# Patient Record
Sex: Male | Born: 1960 | Race: White | Hispanic: No | Marital: Married | Smoking: Never smoker
Health system: Southern US, Community
[De-identification: ages and names within clinical notes are randomized; demographics above are authoritative.]

## PROBLEM LIST (undated history)

## (undated) DIAGNOSIS — K420 Umbilical hernia with obstruction, without gangrene: Secondary | ICD-10-CM

## (undated) DIAGNOSIS — I82509 Chronic embolism and thrombosis of unspecified deep veins of unspecified lower extremity: Secondary | ICD-10-CM

## (undated) DIAGNOSIS — I10 Essential (primary) hypertension: Secondary | ICD-10-CM

## (undated) DIAGNOSIS — E7800 Pure hypercholesterolemia, unspecified: Secondary | ICD-10-CM

## (undated) DIAGNOSIS — Z9289 Personal history of other medical treatment: Secondary | ICD-10-CM

## (undated) DIAGNOSIS — K4090 Unilateral inguinal hernia, without obstruction or gangrene, not specified as recurrent: Secondary | ICD-10-CM

## (undated) DIAGNOSIS — Z955 Presence of coronary angioplasty implant and graft: Secondary | ICD-10-CM

## (undated) DIAGNOSIS — K219 Gastro-esophageal reflux disease without esophagitis: Secondary | ICD-10-CM

## (undated) DIAGNOSIS — R001 Bradycardia, unspecified: Secondary | ICD-10-CM

## (undated) DIAGNOSIS — I2510 Atherosclerotic heart disease of native coronary artery without angina pectoris: Secondary | ICD-10-CM

## (undated) DIAGNOSIS — G5602 Carpal tunnel syndrome, left upper limb: Secondary | ICD-10-CM

## (undated) DIAGNOSIS — N400 Enlarged prostate without lower urinary tract symptoms: Secondary | ICD-10-CM

## (undated) DIAGNOSIS — E785 Hyperlipidemia, unspecified: Secondary | ICD-10-CM

## (undated) DIAGNOSIS — D689 Coagulation defect, unspecified: Secondary | ICD-10-CM

## (undated) DIAGNOSIS — I700 Atherosclerosis of aorta: Secondary | ICD-10-CM

## (undated) DIAGNOSIS — T7840XA Allergy, unspecified, initial encounter: Secondary | ICD-10-CM

## (undated) DIAGNOSIS — K760 Fatty (change of) liver, not elsewhere classified: Secondary | ICD-10-CM

## (undated) DIAGNOSIS — L219 Seborrheic dermatitis, unspecified: Secondary | ICD-10-CM

## (undated) DIAGNOSIS — R7309 Other abnormal glucose: Secondary | ICD-10-CM

## (undated) DIAGNOSIS — K429 Umbilical hernia without obstruction or gangrene: Secondary | ICD-10-CM

## (undated) DIAGNOSIS — N200 Calculus of kidney: Secondary | ICD-10-CM

## (undated) DIAGNOSIS — Z7901 Long term (current) use of anticoagulants: Secondary | ICD-10-CM

## (undated) DIAGNOSIS — I77810 Thoracic aortic ectasia: Secondary | ICD-10-CM

## (undated) DIAGNOSIS — N529 Male erectile dysfunction, unspecified: Secondary | ICD-10-CM

## (undated) DIAGNOSIS — I712 Thoracic aortic aneurysm, without rupture, unspecified: Secondary | ICD-10-CM

## (undated) HISTORY — DX: Calculus of kidney: N20.0

## (undated) HISTORY — PX: COLONOSCOPY: SHX174

## (undated) HISTORY — DX: Allergy, unspecified, initial encounter: T78.40XA

## (undated) HISTORY — PX: REPLACEMENT TOTAL KNEE BILATERAL: SUR1225

## (undated) HISTORY — DX: Essential (primary) hypertension: I10

## (undated) HISTORY — DX: Hyperlipidemia, unspecified: E78.5

## (undated) HISTORY — DX: Gastro-esophageal reflux disease without esophagitis: K21.9

## (undated) HISTORY — PX: WRIST SURGERY: SHX841

## (undated) HISTORY — DX: Coagulation defect, unspecified: D68.9

---

## 1997-09-08 HISTORY — PX: ANTERIOR CRUCIATE LIGAMENT REPAIR: SHX115

## 2010-09-08 DIAGNOSIS — I219 Acute myocardial infarction, unspecified: Secondary | ICD-10-CM

## 2010-09-08 DIAGNOSIS — I2510 Atherosclerotic heart disease of native coronary artery without angina pectoris: Secondary | ICD-10-CM

## 2010-09-08 DIAGNOSIS — I252 Old myocardial infarction: Secondary | ICD-10-CM

## 2010-09-08 HISTORY — DX: Atherosclerotic heart disease of native coronary artery without angina pectoris: I25.10

## 2010-09-08 HISTORY — DX: Acute myocardial infarction, unspecified: I21.9

## 2010-09-08 HISTORY — DX: Old myocardial infarction: I25.2

## 2010-09-08 HISTORY — PX: CORONARY ANGIOPLASTY WITH STENT PLACEMENT: SHX49

## 2015-09-09 HISTORY — PX: TOTAL KNEE ARTHROPLASTY: SHX125

## 2016-09-08 HISTORY — PX: CARPAL TUNNEL RELEASE: SHX101

## 2017-09-08 HISTORY — PX: TOTAL KNEE ARTHROPLASTY: SHX125

## 2017-12-27 DIAGNOSIS — Z955 Presence of coronary angioplasty implant and graft: Secondary | ICD-10-CM | POA: Insufficient documentation

## 2017-12-27 DIAGNOSIS — I712 Thoracic aortic aneurysm, without rupture, unspecified: Secondary | ICD-10-CM | POA: Insufficient documentation

## 2019-09-09 DIAGNOSIS — Z86718 Personal history of other venous thrombosis and embolism: Secondary | ICD-10-CM

## 2019-09-09 DIAGNOSIS — U071 COVID-19: Secondary | ICD-10-CM

## 2019-09-09 HISTORY — DX: Personal history of other venous thrombosis and embolism: Z86.718

## 2019-09-09 HISTORY — DX: COVID-19: U07.1

## 2020-04-27 DIAGNOSIS — Z8616 Personal history of COVID-19: Secondary | ICD-10-CM

## 2020-04-27 HISTORY — DX: Personal history of COVID-19: Z86.16

## 2021-05-01 ENCOUNTER — Ambulatory Visit: Payer: Self-pay | Admitting: Internal Medicine

## 2021-06-11 ENCOUNTER — Ambulatory Visit: Payer: BC Managed Care – PPO | Admitting: Internal Medicine

## 2021-06-11 ENCOUNTER — Other Ambulatory Visit: Payer: Self-pay

## 2021-06-11 ENCOUNTER — Encounter: Payer: Self-pay | Admitting: Internal Medicine

## 2021-06-11 VITALS — BP 156/99 | HR 64 | Ht 67.0 in | Wt 189.0 lb

## 2021-06-11 DIAGNOSIS — I25112 Atherosclerosic heart disease of native coronary artery with refractory angina pectoris: Secondary | ICD-10-CM | POA: Diagnosis not present

## 2021-06-11 DIAGNOSIS — Z86718 Personal history of other venous thrombosis and embolism: Secondary | ICD-10-CM | POA: Insufficient documentation

## 2021-06-11 DIAGNOSIS — E785 Hyperlipidemia, unspecified: Secondary | ICD-10-CM

## 2021-06-11 DIAGNOSIS — I10 Essential (primary) hypertension: Secondary | ICD-10-CM | POA: Insufficient documentation

## 2021-06-11 LAB — MICROSCOPIC EXAMINATION: Epithelial Cells (non renal): NONE SEEN /hpf (ref 0–10)

## 2021-06-11 LAB — URINALYSIS, ROUTINE W REFLEX MICROSCOPIC
Bilirubin, UA: NEGATIVE
Glucose, UA: NEGATIVE
Ketones, UA: NEGATIVE
Nitrite, UA: NEGATIVE
Protein,UA: NEGATIVE
Specific Gravity, UA: 1.025 (ref 1.005–1.030)
Urobilinogen, Ur: 0.2 mg/dL (ref 0.2–1.0)
pH, UA: 5 (ref 5.0–7.5)

## 2021-06-11 MED ORDER — METOPROLOL TARTRATE 25 MG PO TABS: 12.5000 mg | ORAL_TABLET | Freq: Every day | ORAL | 2 refills | Status: DC

## 2021-06-11 MED ORDER — HYDROXYZINE PAMOATE 25 MG PO CAPS: 25.0000 mg | ORAL_CAPSULE | Freq: Every day | ORAL | 1 refills | Status: DC

## 2021-06-11 MED ORDER — LOSARTAN POTASSIUM 100 MG PO TABS: 100.0000 mg | ORAL_TABLET | Freq: Every day | ORAL | 2 refills | Status: DC

## 2021-06-11 MED ORDER — ASPIRIN 81 MG PO TBEC: 81.0000 mg | DELAYED_RELEASE_TABLET | Freq: Every day | ORAL | 12 refills | Status: DC

## 2021-06-11 MED ORDER — CLONIDINE HCL 0.1 MG PO TABS: 0.1000 mg | ORAL_TABLET | Freq: Once | ORAL | Status: DC

## 2021-06-11 MED ORDER — AMLODIPINE BESYLATE 5 MG PO TABS: 5.0000 mg | ORAL_TABLET | Freq: Every day | ORAL | 2 refills | Status: DC

## 2021-06-11 NOTE — Progress Notes (Signed)
BP (!) 156/99 (BP Location: Right Arm, Cuff Size: Large)   Pulse 64   Ht 5\' 7"  (1.702 m)   Wt 189 lb (85.7 kg)   SpO2 100%   BMI 29.60 kg/m    Subjective:    Patient ID: Jaime [name], male    DOB: 11-Jun-1961, 60 y.o.   MRN: [id number]  Chief Complaint  Patient presents with   New Patient (Initial Visit)    HPI: Jaime [name] is a 60 y.o. male  Pt is here to establish care.   He [place] from [place] , wife passed away last year. Married again in June 2022. He has a friend who moved from [facility]   CAD - 2012 s/p stenting is on ranexa last seen   Per pt had COVID jan 2021 and was pplaced on eliquis for ? Blood clots in lower extremity.  Pt is a very poor historian.   Head itching per pt.    Hypertension This is a chronic problem. The current episode started more than 1 year ago. The problem is unchanged. The problem is uncontrolled. Pertinent negatives include no anxiety, blurred vision, chest pain, headaches, malaise/fatigue, neck pain, orthopnea, palpitations, peripheral edema, PND or shortness of breath.  Hyperlipidemia This is a chronic problem. Pertinent negatives include no chest pain or shortness of breath. Risk factors for coronary artery disease include dyslipidemia.   Chief Complaint  Patient presents with   New Patient (Initial Visit)    Relevant past medical, surgical, family and social history reviewed and updated as indicated. Interim medical history since our last visit reviewed. Allergies and medications reviewed and updated.  Review of Systems  Constitutional:  Negative for malaise/fatigue.  Eyes:  Negative for blurred vision.  Respiratory:  Negative for shortness of breath.   Cardiovascular:  Negative for chest pain, palpitations, orthopnea and PND.  Musculoskeletal:  Negative for neck pain.  Neurological:  Negative for headaches.   Per HPI unless specifically indicated above     Objective:    BP (!) 156/99 (BP Location: Right Arm, Cuff  Size: Large)   Pulse 64   Ht 5\' 7"  (1.702 m)   Wt 189 lb (85.7 kg)   SpO2 100%   BMI 29.60 kg/m   Wt Readings from Last 3 Encounters:  06/11/21 189 lb (85.7 kg)    Physical Exam Vitals and nursing note reviewed.  Constitutional:      General: He is not in acute distress.    Appearance: Normal appearance. He is not ill-appearing or diaphoretic.  HENT:     Head: Atraumatic.  Eyes:     Conjunctiva/sclera: Conjunctivae normal.     Pupils: Pupils are equal, round, and reactive to light.  Cardiovascular:     Rate and Rhythm: Normal rate and regular rhythm.     Heart sounds: No murmur heard.   No friction rub. No gallop.  Pulmonary:     Effort: No respiratory distress.     Breath sounds: No stridor. No wheezing or rhonchi.  Chest:     Chest wall: No tenderness.  Abdominal:     General: Abdomen is flat. Bowel sounds are normal. There is no distension.     Palpations: Abdomen is soft. There is no mass.     Tenderness: There is no abdominal tenderness. There is no guarding.  Musculoskeletal:        General: No swelling or deformity.     Cervical back: Normal range of motion and neck supple. No rigidity or  tenderness.     Right lower leg: No edema.     Left lower leg: No edema.  Skin:    General: Skin is warm and dry.     Coloration: Skin is not jaundiced.     Findings: No erythema.  Neurological:     Mental Status: He is alert and oriented to person, place, and time. Mental status is at baseline.  Psychiatric:        Mood and Affect: Mood normal.        Behavior: Behavior normal.        Thought Content: Thought content normal.        Judgment: Judgment normal.    No results found for this or any previous visit.      Current Outpatient Medications:    aspirin 81 MG EC tablet, Take 1 tablet (81 mg total) by mouth daily. Swallow whole., Disp: 30 tablet, Rfl: 12   ezetimibe (ZETIA) 10 MG tablet, Take 10 mg by mouth daily., Disp: , Rfl:    hydrOXYzine (VISTARIL) 25 MG  capsule, Take 1 capsule (25 mg total) by mouth at bedtime., Disp: 30 capsule, Rfl: 1   icosapent Ethyl (VASCEPA) 1 g capsule, Take 2 g by mouth 2 (two) times daily., Disp: , Rfl:    montelukast (SINGULAIR) 10 MG tablet, Take 10 mg by mouth daily., Disp: , Rfl:    ranolazine (RANEXA) 1000 MG SR tablet, Take 1,000 mg by mouth 2 (two) times daily., Disp: , Rfl:    rosuvastatin (CRESTOR) 40 MG tablet, Take 40 mg by mouth daily., Disp: , Rfl:    tadalafil (CIALIS) 20 MG tablet, Take 20 mg by mouth daily as needed for erectile dysfunction., Disp: , Rfl:    amLODipine (NORVASC) 5 MG tablet, Take 1 tablet (5 mg total) by mouth daily., Disp: 90 tablet, Rfl: 2   losartan (COZAAR) 100 MG tablet, Take 1 tablet (100 mg total) by mouth daily., Disp: 90 tablet, Rfl: 2   metoprolol tartrate (LOPRESSOR) 25 MG tablet, Take 0.5 tablets (12.5 mg total) by mouth at bedtime., Disp: 45 tablet, Rfl: 2  Current Facility-Administered Medications:    cloNIDine (CATAPRES) tablet 0.1 mg, 0.1 mg, Oral, Once, Marwah Disbro, MD    Assessment & Plan:  Htn is on metoprolol 12.5 mg, norvasc 5 mg and losartan 100 mg  Continue current meds.  Medication compliance emphasised. pt advised to keep Bp logs. Pt verbalised understanding of the same. Pt to have a low salt diet . Exercise to reach a goal of at least 150 mins a week.  lifestyle modifications explained and pt understands importance of the above.  HLD : is on crestor  40 mg and zetia  LDL not at goal  recheck FLP, check LFT's work on diet, SE of meds explained to pt. low fat and high fiber diet explained to pt.   PVD / CAD is on ranexa.  Will refer pt on cardiology for such  S/p stenting . Continue current meds.    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 - Primary   Relevant Medications   ezetimibe (ZETIA) 10 MG tablet   ranolazine (RANEXA) 1000 MG  SR tablet   rosuvastatin (CRESTOR) 40 MG tablet   icosapent Ethyl (VASCEPA) 1 g capsule   tadalafil (CIALIS) 20 MG tablet   amLODipine (NORVASC) 5 MG tablet   losartan (COZAAR) 100  MG tablet   metoprolol tartrate (LOPRESSOR) 25 MG tablet   aspirin 81 MG EC tablet   cloNIDine (CATAPRES) tablet 0.1 mg   Other Relevant Orders   Ambulatory referral to Cardiology   Comprehensive metabolic panel   CBC with Differential/Platelet   Urinalysis, Routine w reflex microscopic   Primary hypertension   Relevant Medications   ezetimibe (ZETIA) 10 MG tablet   ranolazine (RANEXA) 1000 MG SR tablet   rosuvastatin (CRESTOR) 40 MG tablet   icosapent Ethyl (VASCEPA) 1 g capsule   tadalafil (CIALIS) 20 MG tablet   amLODipine (NORVASC) 5 MG tablet   losartan (COZAAR) 100 MG tablet   metoprolol tartrate (LOPRESSOR) 25 MG tablet   aspirin 81 MG EC tablet   cloNIDine (CATAPRES) tablet 0.1 mg   Other Relevant Orders   Comprehensive metabolic panel   CBC with Differential/Platelet   Urinalysis, Routine w reflex microscopic     Other   History of DVT (deep vein thrombosis)   Relevant Orders   Ambulatory referral to Hematology / Oncology   Lipid panel   TSH   Hyperlipidemia   Relevant Medications   ezetimibe (ZETIA) 10 MG tablet   ranolazine (RANEXA) 1000 MG SR tablet   rosuvastatin (CRESTOR) 40 MG tablet   icosapent Ethyl (VASCEPA) 1 g capsule   tadalafil (CIALIS) 20 MG tablet   amLODipine (NORVASC) 5 MG tablet   losartan (COZAAR) 100 MG tablet   metoprolol tartrate (LOPRESSOR) 25 MG tablet   aspirin 81 MG EC tablet   cloNIDine (CATAPRES) tablet 0.1 mg   Other Relevant Orders   Lipid panel   TSH     Orders Placed This Encounter  Procedures   Comprehensive metabolic panel   CBC with Differential/Platelet   Urinalysis, Routine w reflex microscopic   Lipid panel   TSH   Ambulatory referral to Cardiology   Ambulatory referral to Hematology / Oncology     Meds ordered this encounter   Medications   amLODipine (NORVASC) 5 MG tablet    Sig: Take 1 tablet (5 mg total) by mouth daily.    Dispense:  90 tablet    Refill:  2   losartan (COZAAR) 100 MG tablet    Sig: Take 1 tablet (100 mg total) by mouth daily.    Dispense:  90 tablet    Refill:  2   metoprolol tartrate (LOPRESSOR) 25 MG tablet    Sig: Take 0.5 tablets (12.5 mg total) by mouth at bedtime.    Dispense:  45 tablet    Refill:  2   aspirin 81 MG EC tablet    Sig: Take 1 tablet (81 mg total) by mouth daily. Swallow whole.    Dispense:  30 tablet    Refill:  12   hydrOXYzine (VISTARIL) 25 MG capsule    Sig: Take 1 capsule (25 mg total) by mouth at bedtime.    Dispense:  30 capsule    Refill:  1   cloNIDine (CATAPRES) tablet 0.1 mg     Follow up plan: No follow-ups on file.  Marland Kitchen  0.1 mg of clonidine given to pt - his bp droped to 171/90 mm hg -- then 156/99 fter 40 and 50 mins of administering this.

## 2021-06-12 LAB — CBC WITH DIFFERENTIAL/PLATELET
Basophils Absolute: 0.1 10*3/uL (ref 0.0–0.2)
Basos: 1 %
EOS (ABSOLUTE): 0.2 10*3/uL (ref 0.0–0.4)
Eos: 2 %
Hematocrit: 49.8 % (ref 37.5–51.0)
Hemoglobin: 16.8 g/dL (ref 13.0–17.7)
Immature Grans (Abs): 0 10*3/uL (ref 0.0–0.1)
Immature Granulocytes: 0 %
Lymphocytes Absolute: 1.8 10*3/uL (ref 0.7–3.1)
Lymphs: 29 %
MCH: 30.6 pg (ref 26.6–33.0)
MCHC: 33.7 g/dL (ref 31.5–35.7)
MCV: 91 fL (ref 79–97)
Monocytes Absolute: 0.7 10*3/uL (ref 0.1–0.9)
Monocytes: 11 %
Neutrophils Absolute: 3.6 10*3/uL (ref 1.4–7.0)
Neutrophils: 57 %
Platelets: 240 10*3/uL (ref 150–450)
RBC: 5.49 x10E6/uL (ref 4.14–5.80)
RDW: 12.8 % (ref 11.6–15.4)
WBC: 6.4 10*3/uL (ref 3.4–10.8)

## 2021-06-12 LAB — COMPREHENSIVE METABOLIC PANEL
ALT: 25 IU/L (ref 0–44)
AST: 21 IU/L (ref 0–40)
Albumin/Globulin Ratio: 2.2 (ref 1.2–2.2)
Albumin: 5 g/dL — ABNORMAL HIGH (ref 3.8–4.9)
Alkaline Phosphatase: 103 IU/L (ref 44–121)
BUN/Creatinine Ratio: 11 (ref 9–20)
BUN: 11 mg/dL (ref 6–24)
Bilirubin Total: 0.4 mg/dL (ref 0.0–1.2)
CO2: 24 mmol/L (ref 20–29)
Calcium: 9.5 mg/dL (ref 8.7–10.2)
Chloride: 103 mmol/L (ref 96–106)
Creatinine, Ser: 1.04 mg/dL (ref 0.76–1.27)
Globulin, Total: 2.3 g/dL (ref 1.5–4.5)
Glucose: 87 mg/dL (ref 70–99)
Potassium: 4.1 mmol/L (ref 3.5–5.2)
Sodium: 145 mmol/L — ABNORMAL HIGH (ref 134–144)
Total Protein: 7.3 g/dL (ref 6.0–8.5)
eGFR: 83 mL/min/{1.73_m2} (ref >59)

## 2021-07-01 ENCOUNTER — Inpatient Hospital Stay: Payer: BC Managed Care – PPO

## 2021-07-01 ENCOUNTER — Encounter: Payer: Self-pay | Admitting: Oncology

## 2021-07-01 ENCOUNTER — Other Ambulatory Visit: Payer: Self-pay

## 2021-07-01 ENCOUNTER — Inpatient Hospital Stay: Payer: BC Managed Care – PPO | Attending: Oncology | Admitting: Oncology

## 2021-07-01 VITALS — BP 144/93 | HR 62 | Temp 97.3°F | Wt 191.6 lb

## 2021-07-01 DIAGNOSIS — Z86718 Personal history of other venous thrombosis and embolism: Secondary | ICD-10-CM | POA: Insufficient documentation

## 2021-07-01 DIAGNOSIS — I2510 Atherosclerotic heart disease of native coronary artery without angina pectoris: Secondary | ICD-10-CM | POA: Insufficient documentation

## 2021-07-01 DIAGNOSIS — Z86711 Personal history of pulmonary embolism: Secondary | ICD-10-CM | POA: Insufficient documentation

## 2021-07-01 DIAGNOSIS — Z800 Family history of malignant neoplasm of digestive organs: Secondary | ICD-10-CM | POA: Diagnosis not present

## 2021-07-01 DIAGNOSIS — Z7901 Long term (current) use of anticoagulants: Secondary | ICD-10-CM | POA: Insufficient documentation

## 2021-07-01 DIAGNOSIS — I252 Old myocardial infarction: Secondary | ICD-10-CM | POA: Diagnosis not present

## 2021-07-01 LAB — ANTITHROMBIN III: AntiThromb III Func: 95 % (ref 75–120)

## 2021-07-01 NOTE — Progress Notes (Addendum)
Hematology/Oncology Consult note [phone] [phone]   Patient Care Team: Loura Pardon, MD as PCP - General (Internal Medicine)  REFERRING PROVIDER: Loura Pardon, MD  CHIEF COMPLAINTS/REASON FOR VISIT:  Evaluation of history of DVT  HISTORY OF PRESENTING ILLNESS:   Jaime [name] is a  60 y.o.  male with PMH listed below was seen in consultation at the request of  Vigg, Avanti, MD  for evaluation of history of DVT  Patient was accompanied by his wife.  He moved from [place].  He reports that he supposed to be on Eliquis for anticoagulation has been off for couple of months. Patient recalls that he has had 2 episodes of VTE. First episode happened in 2000, after ACL repair surgery patient developed left leg DVT and PE.  Patient was put on Coumadin for about a year and he came off.  Second episode was in January 2021.  Patient was admitted in the hospital from 09/19/2019 - 09/26/2019.  He he reports that he developed bilateral lower extremity PE and was put on Eliquis.  His primary care doctor has been prescribing him Eliquis and recommend him to go on lifelong anticoagulation.   Mother had a history of pancreatic cancer. Today patient denies any shortness of breath, no extremity swelling.  Previously he denies any acute bleeding events when he was on anticoagulation.  Denies any constitutional symptoms.  Patient had CAD in 2012 status post stenting. Review of Systems  Constitutional:  Negative for appetite change, chills, fatigue, fever and unexpected weight change.  HENT:   Negative for hearing loss and voice change.   Eyes:  Negative for eye problems and icterus.  Respiratory:  Negative for chest tightness, cough and shortness of breath.   Cardiovascular:  Negative for chest pain and leg swelling.  Gastrointestinal:  Negative for abdominal distention and abdominal pain.  Endocrine: Negative for hot flashes.  Genitourinary:  Negative for difficulty urinating,  dysuria and frequency.   Musculoskeletal:  Negative for arthralgias.  Skin:  Negative for itching and rash.  Neurological:  Negative for light-headedness and numbness.  Hematological:  Negative for adenopathy. Does not bruise/bleed easily.  Psychiatric/Behavioral:  Negative for confusion.    MEDICAL HISTORY:  Past Medical History:  Diagnosis Date   Allergy    Clotting disorder (HCC)    GERD (gastroesophageal reflux disease)    History of heart attack 2012   Hyperlipidemia    Hypertension     SURGICAL HISTORY: Past Surgical History:  Procedure Laterality Date   CORONARY ANGIOPLASTY WITH STENT PLACEMENT  2012   REPLACEMENT TOTAL KNEE BILATERAL Bilateral    2017 left knee, 2019 right knee   WRIST SURGERY Left    Carpel Tunnel    SOCIAL HISTORY: Social History   Socioeconomic History   Marital status: Married    Spouse name: Technical sales engineer   Number of children: Not on file   Years of education: Not on file   Highest education level: Not on file  Occupational History   Not on file  Tobacco Use   Smoking status: Never   Smokeless tobacco: Never  Vaping Use   Vaping Use: Never used  Substance and Sexual Activity   Alcohol use: Yes    Comment: RARE- 1 to 2 a month   Drug use: Never   Sexual activity: Yes    Birth control/protection: None  Other Topics Concern   Not on file  Social History Narrative   Not on file   Social Determinants of Health  Financial Resource Strain: Not on file  Food Insecurity: Not on file  Transportation Needs: Not on file  Physical Activity: Not on file  Stress: Not on file  Social Connections: Not on file  Intimate Partner Violence: Not on file    FAMILY HISTORY: Family History  Problem Relation Age of Onset   Pancreatic cancer Mother    Heart attack Paternal Aunt     ALLERGIES:  is allergic to codeine.  MEDICATIONS:  Current Outpatient Medications  Medication Sig Dispense Refill   amLODipine (NORVASC) 5 MG tablet Take 1 tablet  (5 mg total) by mouth daily. 90 tablet 2   aspirin 81 MG EC tablet Take 1 tablet (81 mg total) by mouth daily. Swallow whole. 30 tablet 12   ezetimibe (ZETIA) 10 MG tablet Take 10 mg by mouth daily.     hydrOXYzine (VISTARIL) 25 MG capsule Take 1 capsule (25 mg total) by mouth at bedtime. 30 capsule 1   icosapent Ethyl (VASCEPA) 1 g capsule Take 2 g by mouth 2 (two) times daily.     losartan (COZAAR) 100 MG tablet Take 1 tablet (100 mg total) by mouth daily. 90 tablet 2   metoprolol tartrate (LOPRESSOR) 25 MG tablet Take 0.5 tablets (12.5 mg total) by mouth at bedtime. 45 tablet 2   montelukast (SINGULAIR) 10 MG tablet Take 10 mg by mouth daily.     ranolazine (RANEXA) 1000 MG SR tablet Take 1,000 mg by mouth 2 (two) times daily.     rosuvastatin (CRESTOR) 40 MG tablet Take 40 mg by mouth daily.     tadalafil (CIALIS) 20 MG tablet Take 20 mg by mouth daily as needed for erectile dysfunction.     Current Facility-Administered Medications  Medication Dose Route Frequency Provider Last Rate Last Admin   cloNIDine (CATAPRES) tablet 0.1 mg  0.1 mg Oral Once Vigg, Avanti, MD         PHYSICAL EXAMINATION: ECOG PERFORMANCE STATUS: 0 - Asymptomatic Vitals:   07/01/21 0938  BP: (!) 144/93  Pulse: 62  Temp: (!) 97.3 F (36.3 C)  SpO2: 98%   Filed Weights   07/01/21 0938  Weight: 191 lb 9.6 oz (86.9 kg)    Physical Exam Constitutional:      General: He is not in acute distress. HENT:     Head: Normocephalic and atraumatic.  Eyes:     General: No scleral icterus. Cardiovascular:     Rate and Rhythm: Normal rate and regular rhythm.     Heart sounds: Normal heart sounds.  Pulmonary:     Effort: Pulmonary effort is normal. No respiratory distress.     Breath sounds: No wheezing.  Abdominal:     General: Bowel sounds are normal. There is no distension.     Palpations: Abdomen is soft.  Musculoskeletal:        General: No deformity. Normal range of motion.     Cervical back: Normal  range of motion and neck supple.  Skin:    General: Skin is warm and dry.     Findings: No erythema or rash.  Neurological:     Mental Status: He is alert and oriented to person, place, and time. Mental status is at baseline.     Cranial Nerves: No cranial nerve deficit.     Coordination: Coordination normal.  Psychiatric:        Mood and Affect: Mood normal.    LABORATORY DATA:  I have reviewed the data as listed Lab Results  Component Value Date   WBC 6.4 06/11/2021   HGB 16.8 06/11/2021   HCT 49.8 06/11/2021   MCV 91 06/11/2021   PLT 240 06/11/2021   Recent Labs    06/11/21 1346  NA 145*  K 4.1  CL 103  CO2 24  GLUCOSE 87  BUN 11  CREATININE 1.04  CALCIUM 9.5  PROT 7.3  ALBUMIN 5.0*  AST 21  ALT 25  ALKPHOS 103  BILITOT 0.4   Iron/TIBC/Ferritin/ %Sat No results found for: IRON, TIBC, FERRITIN, IRONPCTSAT    RADIOGRAPHIC STUDIES: I have personally reviewed the radiological images as listed and agreed with the findings in the report. No results found.    ASSESSMENT & PLAN:  1. History of DVT (deep vein thrombosis)    History of DVT/PE. Based on the history patient provided, his first episode appeared to be provoked by ACL surgery. Second episode could be provoked by hospitalization as well as COVID-19. Available medical records scanned into EMR showed negative bilateral lower extremity DVT on 10/04/2019 which is a few days after his discharge in January 2021. I will check hypercoagulable work-up. Patient will follow-up in 2 to 3 weeks to go over results.  He agrees with the plan.  Patient reports that he needs to get colonoscopy every 3 years.  He does not know the reason of frequent colonoscopy. Recommend patient to further discuss with primary care provider and be referred to gastroenterology.  Orders Placed This Encounter  Procedures   ANTIPHOSPHOLIPID SYNDROME PROF    Standing Status:   Future    Number of Occurrences:   1    Standing Expiration  Date:   07/01/2022   Prothrombin gene mutation    Standing Status:   Future    Number of Occurrences:   1    Standing Expiration Date:   07/01/2022   Factor 5 leiden    Standing Status:   Future    Number of Occurrences:   1    Standing Expiration Date:   07/01/2022   Antithrombin III    Standing Status:   Future    Number of Occurrences:   1    Standing Expiration Date:   07/01/2022   Protein C activity    Standing Status:   Future    Number of Occurrences:   1    Standing Expiration Date:   07/01/2022   Protein S, total and free    Standing Status:   Future    Number of Occurrences:   1    Standing Expiration Date:   07/01/2022    All questions were answered. The patient knows to call the clinic with any problems questions or concerns.  cc Vigg, Avanti, MD    Return of visit:  Thank you for this kind referral and the opportunity to participate in the care of this patient. A copy of today's note is routed to referring provider    Rickard Patience, MD, PhD Hematology Oncology [facility]  07/01/2021

## 2021-07-02 LAB — PROTEIN S, TOTAL AND FREE
Protein S Ag, Free: 102 % (ref 61–136)
Protein S Ag, Total: 101 % (ref 60–150)

## 2021-07-02 LAB — PROTEIN C ACTIVITY: Protein C Activity: 134 % (ref 73–180)

## 2021-07-03 LAB — ANTIPHOSPHOLIPID SYNDROME PROF
Anticardiolipin IgG: <9 GPL U/mL (ref 0–14)
Anticardiolipin IgM: <9 MPL U/mL (ref 0–12)
DRVVT: 30.9 s (ref 0.0–47.0)
PTT Lupus Anticoagulant: 30.5 s (ref 0.0–51.9)

## 2021-07-04 LAB — FACTOR 5 LEIDEN

## 2021-07-08 LAB — PROTHROMBIN GENE MUTATION

## 2021-07-15 ENCOUNTER — Ambulatory Visit: Payer: BC Managed Care – PPO | Admitting: Cardiology

## 2021-07-15 ENCOUNTER — Other Ambulatory Visit: Payer: Self-pay

## 2021-07-15 ENCOUNTER — Encounter: Payer: Self-pay | Admitting: Cardiology

## 2021-07-15 VITALS — BP 146/80 | HR 51 | Ht 67.0 in | Wt 190.0 lb

## 2021-07-15 DIAGNOSIS — I2510 Atherosclerotic heart disease of native coronary artery without angina pectoris: Secondary | ICD-10-CM | POA: Diagnosis not present

## 2021-07-15 DIAGNOSIS — E7800 Pure hypercholesterolemia, unspecified: Secondary | ICD-10-CM | POA: Diagnosis not present

## 2021-07-15 DIAGNOSIS — I10 Essential (primary) hypertension: Secondary | ICD-10-CM | POA: Diagnosis not present

## 2021-07-15 MED ORDER — PANTOPRAZOLE SODIUM 40 MG PO TBEC: 40.0000 mg | DELAYED_RELEASE_TABLET | Freq: Two times a day (BID) | ORAL | 1 refills | Status: DC

## 2021-07-15 MED ORDER — AMLODIPINE BESYLATE 10 MG PO TABS: 10.0000 mg | ORAL_TABLET | Freq: Every day | ORAL | 1 refills | Status: DC

## 2021-07-15 MED ORDER — ICOSAPENT ETHYL 1 G PO CAPS: 2.0000 g | ORAL_CAPSULE | Freq: Two times a day (BID) | ORAL | 5 refills | Status: DC

## 2021-07-15 MED ORDER — ROSUVASTATIN CALCIUM 40 MG PO TABS: 40.0000 mg | ORAL_TABLET | Freq: Every day | ORAL | 1 refills | Status: DC

## 2021-07-15 MED ORDER — EZETIMIBE 10 MG PO TABS: 10.0000 mg | ORAL_TABLET | Freq: Every day | ORAL | 1 refills | Status: DC

## 2021-07-15 NOTE — Patient Instructions (Addendum)
Medication Instructions:    Your physician has recommended you make the following change in your medication:    INCREASE your Amlodipine (Norvasc) to 10 MG once a day.   *If you need a refill on your cardiac medications before your next appointment, please call your pharmacy*   Lab Work:  Your physician recommends that you return for a FASTING lipid profile: Anytime prior to your follow up appointment  - You will need to be fasting. Please do not have anything to eat or drink after midnight the morning you have the lab work. You may only have water or black coffee with no cream or sugar.     Testing/Procedures:  Your physician has requested that you have an echocardiogram. Echocardiography is a painless test that uses sound waves to create images of your heart. It provides your doctor with information about the size and shape of your heart and how well your heart's chambers and valves are working. This procedure takes approximately one hour. There are no restrictions for this procedure.    Follow-Up: At [facility], you and your health needs are our priority.  As part of our continuing mission to provide you with exceptional heart care, we have created designated Provider Care Teams.  These Care Teams include your primary Cardiologist (physician) and Advanced Practice Providers (APPs -  Physician Assistants and Nurse Practitioners) who all work together to provide you with the care you need, when you need it.  We recommend signing up for the patient portal called "MyChart".  Sign up information is provided on this After Visit Summary.  MyChart is used to connect with patients for Virtual Visits (Telemedicine).  Patients are able to view lab/test results, encounter notes, upcoming appointments, etc.  Non-urgent messages can be sent to your provider as well.   To learn more about what you can do with MyChart, go to [email or web address].    Your next appointment:   2 month(s)  The  format for your next appointment:   In Person  Provider:   You may see Dr. Azucena Cecil  or one of the following Advanced Practice Providers on your designated Care Team:   Nicolasa Ducking, NP Eula Listen, PA-C Cadence Fransico Michael, [place]

## 2021-07-15 NOTE — Progress Notes (Signed)
Cardiology Office Note:    Date:  07/15/2021   ID:  [name], DOB 1960-12-23, MRN [id number]  PCP:  Loura Pardon, MD   [facility] Providers Cardiologist:  Debbe Odea, MD     Referring MD: Loura Pardon, MD   Chief Complaint  Patient presents with   New Patient (Initial Visit)    Referred by PCP for CAD. Meds reviewed verbally with patient.    Jaime [name] is a 60 y.o. male who is being seen today for the evaluation of CAD at the request of Vigg, Avanti, MD.   History of Present Illness:    Jaime [name] is a 60 y.o. male with a hx of CAD s/p PCI to LAD in 2012, hypertension, hyperlipidemia, DVT who presents to establish care.  Patient recently moved to the area from [place].  Had symptoms of chest pain in 2021, underwent left heart cath which showed significant stenosis in the LAD, stent was placed.  He has been doing okay since then.  Denies chest pain or shortness of breath at rest or with exertion.  Has occasional reflux.  He is out of his Protonix.  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  He was treated with Coumadin for a year.  Diagnosed with COVID-19 04/27/2020, developed right lower extremity DVT.  Was started on Eliquis.  He was told he would likely be on Eliquis long-term.  He took Eliquis for about 30 days and then stop.  Did not refill.  Saw hematology last month, coagulation work-up underway.  Past Medical History:  Diagnosis Date   Allergy    Clotting disorder (HCC)    GERD (gastroesophageal reflux disease)    History of heart attack 2012   Hyperlipidemia    Hypertension     Past Surgical History:  Procedure Laterality Date   CORONARY ANGIOPLASTY WITH STENT PLACEMENT  2012   REPLACEMENT TOTAL KNEE BILATERAL Bilateral    2017 left knee, 2019 right knee   WRIST SURGERY Left    Carpel Tunnel    Current Medications: Current Meds  Medication Sig   aspirin 81 MG EC tablet Take 1 tablet (81 mg total) by mouth  daily. Swallow whole.   hydrOXYzine (VISTARIL) 25 MG capsule Take 1 capsule (25 mg total) by mouth at bedtime.   losartan (COZAAR) 100 MG tablet Take 1 tablet (100 mg total) by mouth daily.   metoprolol tartrate (LOPRESSOR) 25 MG tablet Take 0.5 tablets (12.5 mg total) by mouth at bedtime.   montelukast (SINGULAIR) 10 MG tablet Take 10 mg by mouth daily.   ranolazine (RANEXA) 1000 MG SR tablet Take 1,000 mg by mouth 2 (two) times daily.   tadalafil (CIALIS) 20 MG tablet Take 20 mg by mouth daily as needed for erectile dysfunction.   [DISCONTINUED] amLODipine (NORVASC) 5 MG tablet Take 1 tablet (5 mg total) by mouth daily.   [DISCONTINUED] ezetimibe (ZETIA) 10 MG tablet Take 10 mg by mouth daily.   [DISCONTINUED] icosapent Ethyl (VASCEPA) 1 g capsule Take 2 g by mouth 2 (two) times daily.   [DISCONTINUED] pantoprazole (PROTONIX) 20 MG tablet Take 20 mg by mouth in the morning and at bedtime.   [DISCONTINUED] rosuvastatin (CRESTOR) 40 MG tablet Take 40 mg by mouth daily.   Current Facility-Administered Medications for the 07/15/21 encounter (Office Visit) with Debbe Odea, MD  Medication   cloNIDine (CATAPRES) tablet 0.1 mg     Allergies:   Codeine   Social History   Socioeconomic  History   Marital status: Married    Spouse name: Gladais   Number of children: Not on file   Years of education: Not on file   Highest education level: Not on file  Occupational History   Not on file  Tobacco Use   Smoking status: Never   Smokeless tobacco: Never  Vaping Use   Vaping Use: Never used  Substance and Sexual Activity   Alcohol use: Yes    Comment: RARE- 1 to 2 a month   Drug use: Never   Sexual activity: Yes    Birth control/protection: None  Other Topics Concern   Not on file  Social History Narrative   Not on file   Social Determinants of Health   Financial Resource Strain: Not on file  Food Insecurity: Not on file  Transportation Needs: Not on file  Physical Activity:  Not on file  Stress: Not on file  Social Connections: Not on file     Family History: The patient's family history includes Heart attack in his paternal aunt; Pancreatic cancer in his mother.  ROS:   Please see the history of present illness.     All other systems reviewed and are negative.  EKGs/Labs/Other Studies Reviewed:    The following studies were reviewed today:   EKG:  EKG is  ordered today.  The ekg ordered today demonstrates sinus bradycardia, otherwise normal.  Recent Labs: 06/11/2021: ALT 25; BUN 11; Creatinine, Ser 1.04; Hemoglobin 16.8; Platelets 240; Potassium 4.1; Sodium 145  Recent Lipid Panel No results found for: CHOL, TRIG, HDL, CHOLHDL, VLDL, LDLCALC, LDLDIRECT   Risk Assessment/Calculations:          Physical Exam:    VS:  BP (!) 146/80 (BP Location: Left Arm, Patient Position: Sitting, Cuff Size: Normal)   Pulse (!) 51   Ht 5\' 7"  (1.702 m)   Wt 190 lb (86.2 kg)   SpO2 96%   BMI 29.76 kg/m     Wt Readings from Last 3 Encounters:  07/15/21 190 lb (86.2 kg)  07/01/21 191 lb 9.6 oz (86.9 kg)  06/11/21 189 lb (85.7 kg)     GEN:  Well nourished, well developed in no acute distress HEENT: Normal NECK: No JVD; No carotid bruits LYMPHATICS: No lymphadenopathy CARDIAC: Regular, bradycardic. RESPIRATORY:  Clear to auscultation without rales, wheezing or rhonchi  ABDOMEN: Soft, non-tender, non-distended MUSCULOSKELETAL:  No edema; No deformity  SKIN: Warm and dry NEUROLOGIC:  Alert and oriented x 3 PSYCHIATRIC:  Normal affect   ASSESSMENT:    1. Coronary artery disease involving native heart without angina pectoris, unspecified vessel or lesion type   2. Primary hypertension   3. Pure hypercholesterolemia    PLAN:    In order of problems listed above:  CAD s/p PCI to LAD 2012.  Denies chest pain or shortness of breath.  Get echocardiogram.  Continue aspirin, Crestor, Lopressor. Hypertension, BP elevated.  Increase Norvasc to 10 mg daily.   Continue losartan, Lopressor. Hyperlipidemia, restart Crestor, Zetia, Vascepa.  Obtain fasting lipid profile. History of GERD,  Protonix 40 mg daily  Follow-up after echocardiogram.      Medication Adjustments/Labs and Tests Ordered: Current medicines are reviewed at length with the patient today.  Concerns regarding medicines are outlined above.  Orders Placed This Encounter  Procedures   Lipid panel   EKG 12-Lead   ECHOCARDIOGRAM COMPLETE    Meds ordered this encounter  Medications   amLODipine (NORVASC) 10 MG tablet    Sig:  Take 1 tablet (10 mg total) by mouth daily.    Dispense:  90 tablet    Refill:  1   ezetimibe (ZETIA) 10 MG tablet    Sig: Take 1 tablet (10 mg total) by mouth daily.    Dispense:  90 tablet    Refill:  1   rosuvastatin (CRESTOR) 40 MG tablet    Sig: Take 1 tablet (40 mg total) by mouth daily.    Dispense:  90 tablet    Refill:  1   icosapent Ethyl (VASCEPA) 1 g capsule    Sig: Take 2 capsules (2 g total) by mouth 2 (two) times daily.    Dispense:  120 capsule    Refill:  5   pantoprazole (PROTONIX) 40 MG tablet    Sig: Take 1 tablet (40 mg total) by mouth in the morning and at bedtime.    Dispense:  90 tablet    Refill:  1     Patient Instructions  Medication Instructions:    Your physician has recommended you make the following change in your medication:    INCREASE your Amlodipine (Norvasc) to 10 MG once a day.   *If you need a refill on your cardiac medications before your next appointment, please call your pharmacy*   Lab Work:  Your physician recommends that you return for a FASTING lipid profile: Anytime prior to your follow up appointment  - You will need to be fasting. Please do not have anything to eat or drink after midnight the morning you have the lab work. You may only have water or black coffee with no cream or sugar.     Testing/Procedures:  Your physician has requested that you have an echocardiogram.  Echocardiography is a painless test that uses sound waves to create images of your heart. It provides your doctor with information about the size and shape of your heart and how well your heart's chambers and valves are working. This procedure takes approximately one hour. There are no restrictions for this procedure.    Follow-Up: At [facility], you and your health needs are our priority.  As part of our continuing mission to provide you with exceptional heart care, we have created designated Provider Care Teams.  These Care Teams include your primary Cardiologist (physician) and Advanced Practice Providers (APPs -  Physician Assistants and Nurse Practitioners) who all work together to provide you with the care you need, when you need it.  We recommend signing up for the patient portal called "MyChart".  Sign up information is provided on this After Visit Summary.  MyChart is used to connect with patients for Virtual Visits (Telemedicine).  Patients are able to view lab/test results, encounter notes, upcoming appointments, etc.  Non-urgent messages can be sent to your provider as well.   To learn more about what you can do with MyChart, go to [email or web address].    Your next appointment:   2 month(s)  The format for your next appointment:   In Person  Provider:   You may see Dr. Azucena Cecil  or one of the following Advanced Practice Providers on your designated Care Team:   Nicolasa Ducking, NP Eula Listen, PA-C Cadence Fransico Michael, [place]      Signed, Debbe Odea, MD  07/15/2021 12:32 PM    [facility]

## 2021-07-22 ENCOUNTER — Other Ambulatory Visit: Payer: Self-pay

## 2021-07-22 ENCOUNTER — Other Ambulatory Visit (INDEPENDENT_AMBULATORY_CARE_PROVIDER_SITE_OTHER): Payer: BC Managed Care – PPO

## 2021-07-22 DIAGNOSIS — E7800 Pure hypercholesterolemia, unspecified: Secondary | ICD-10-CM

## 2021-07-23 ENCOUNTER — Inpatient Hospital Stay: Payer: BC Managed Care – PPO | Attending: Oncology | Admitting: Oncology

## 2021-07-23 ENCOUNTER — Encounter: Payer: Self-pay | Admitting: Oncology

## 2021-07-23 VITALS — BP 146/87 | HR 68 | Temp 96.8°F | Wt 198.2 lb

## 2021-07-23 DIAGNOSIS — Z86711 Personal history of pulmonary embolism: Secondary | ICD-10-CM | POA: Insufficient documentation

## 2021-07-23 DIAGNOSIS — I10 Essential (primary) hypertension: Secondary | ICD-10-CM | POA: Diagnosis not present

## 2021-07-23 DIAGNOSIS — I2510 Atherosclerotic heart disease of native coronary artery without angina pectoris: Secondary | ICD-10-CM | POA: Diagnosis not present

## 2021-07-23 DIAGNOSIS — Z79899 Other long term (current) drug therapy: Secondary | ICD-10-CM | POA: Diagnosis not present

## 2021-07-23 DIAGNOSIS — E785 Hyperlipidemia, unspecified: Secondary | ICD-10-CM | POA: Diagnosis not present

## 2021-07-23 DIAGNOSIS — I252 Old myocardial infarction: Secondary | ICD-10-CM | POA: Diagnosis not present

## 2021-07-23 DIAGNOSIS — Z86718 Personal history of other venous thrombosis and embolism: Secondary | ICD-10-CM | POA: Diagnosis not present

## 2021-07-23 DIAGNOSIS — Z7982 Long term (current) use of aspirin: Secondary | ICD-10-CM | POA: Diagnosis not present

## 2021-07-23 LAB — LIPID PANEL
Chol/HDL Ratio: 2.9 ratio (ref 0.0–5.0)
Cholesterol, Total: 112 mg/dL (ref 100–199)
HDL: 39 mg/dL — ABNORMAL LOW (ref >39)
LDL Chol Calc (NIH): 60 mg/dL (ref 0–99)
Triglycerides: 61 mg/dL (ref 0–149)
VLDL Cholesterol Cal: 13 mg/dL (ref 5–40)

## 2021-07-23 NOTE — Progress Notes (Signed)
Hematology/Oncology Consult note [phone] [phone]   Patient Care Team: Charlynne Cousins, MD as PCP - General (Internal Medicine) Kate Sable, MD as PCP - Cardiology (Cardiology) Kate Sable, MD as Consulting Physician (Cardiology)  REFERRING PROVIDER: Charlynne Cousins, MD  CHIEF COMPLAINTS/REASON FOR VISIT:  history of DVT  HISTORY OF PRESENTING ILLNESS:   Jaime [name] is a  60 y.o.  male with PMH listed below was seen in consultation at the request of  Vigg, Avanti, MD  for evaluation of history of DVT  Patient was accompanied by his wife.  He moved from [place].  He reports that he supposed to be on Eliquis for anticoagulation has been off for couple of months. Patient recalls that he has had 2 episodes of VTE. First episode happened in 2000, after ACL repair surgery patient developed left leg DVT and PE.  Patient was put on Coumadin for about a year and he came off.  Second episode was in January 2021.  Patient was admitted in the hospital from 09/19/2019 - 09/26/2019.  He he reports that he developed bilateral lower extremity PE and was put on Eliquis.  His primary care doctor has been prescribing him Eliquis and recommend him to go on lifelong anticoagulation.   Mother had a history of pancreatic cancer. Today patient denies any shortness of breath, no extremity swelling.  Previously he denies any acute bleeding events when he was on anticoagulation.  Denies any constitutional symptoms.  Patient had CAD in 2012 status post stenting.   INTERVAL HISTORY [name] is a 60 y.o. male who has above history reviewed by me today presents for follow up visit for management of history of DVT, patient has had blood work done at the last visit.  He present to discuss results and management plan.  He has no new complaints.  He has been off Eliquis since March 2022.  No shortness of breath, leg swelling.     Review of Systems  Constitutional:  Negative  for appetite change, chills, fatigue, fever and unexpected weight change.  HENT:   Negative for hearing loss and voice change.   Eyes:  Negative for eye problems and icterus.  Respiratory:  Negative for chest tightness, cough and shortness of breath.   Cardiovascular:  Negative for chest pain and leg swelling.  Gastrointestinal:  Negative for abdominal distention and abdominal pain.  Endocrine: Negative for hot flashes.  Genitourinary:  Negative for difficulty urinating, dysuria and frequency.   Musculoskeletal:  Negative for arthralgias.  Skin:  Negative for itching and rash.  Neurological:  Negative for light-headedness and numbness.  Hematological:  Negative for adenopathy. Does not bruise/bleed easily.  Psychiatric/Behavioral:  Negative for confusion.    MEDICAL HISTORY:  Past Medical History:  Diagnosis Date   Allergy    Clotting disorder ([place])    GERD (gastroesophageal reflux disease)    History of heart attack 2012   Hyperlipidemia    Hypertension     SURGICAL HISTORY: Past Surgical History:  Procedure Laterality Date   CORONARY ANGIOPLASTY WITH STENT PLACEMENT  2012   REPLACEMENT TOTAL KNEE BILATERAL Bilateral    2017 left knee, 2019 right knee   WRIST SURGERY Left    Carpel Tunnel    SOCIAL HISTORY: Social History   Socioeconomic History   Marital status: Married    Spouse name: Jaime [name]   Number of children: Not on file   Years of education: Not on file   Highest education level: Not on file  Occupational  History   Not on file  Tobacco Use   Smoking status: Never   Smokeless tobacco: Never  Vaping Use   Vaping Use: Never used  Substance and Sexual Activity   Alcohol use: Yes    Comment: RARE- 1 to 2 a month   Drug use: Never   Sexual activity: Yes    Birth control/protection: None  Other Topics Concern   Not on file  Social History Narrative   Not on file   Social Determinants of Health   Financial Resource Strain: Not on file  Food  Insecurity: Not on file  Transportation Needs: Not on file  Physical Activity: Not on file  Stress: Not on file  Social Connections: Not on file  Intimate Partner Violence: Not on file    FAMILY HISTORY: Family History  Problem Relation Age of Onset   Pancreatic cancer Mother    Heart attack Paternal Aunt     ALLERGIES:  is allergic to codeine.  MEDICATIONS:  Current Outpatient Medications  Medication Sig Dispense Refill   amLODipine (NORVASC) 10 MG tablet Take 1 tablet (10 mg total) by mouth daily. 90 tablet 1   aspirin 81 MG EC tablet Take 1 tablet (81 mg total) by mouth daily. Swallow whole. 30 tablet 12   ezetimibe (ZETIA) 10 MG tablet Take 1 tablet (10 mg total) by mouth daily. 90 tablet 1   hydrOXYzine (VISTARIL) 25 MG capsule Take 1 capsule (25 mg total) by mouth at bedtime. 30 capsule 1   icosapent Ethyl (VASCEPA) 1 g capsule Take 2 capsules (2 g total) by mouth 2 (two) times daily. 120 capsule 5   losartan (COZAAR) 100 MG tablet Take 1 tablet (100 mg total) by mouth daily. 90 tablet 2   metoprolol tartrate (LOPRESSOR) 25 MG tablet Take 0.5 tablets (12.5 mg total) by mouth at bedtime. 45 tablet 2   montelukast (SINGULAIR) 10 MG tablet Take 10 mg by mouth daily.     pantoprazole (PROTONIX) 40 MG tablet Take 1 tablet (40 mg total) by mouth in the morning and at bedtime. 90 tablet 1   ranolazine (RANEXA) 1000 MG SR tablet Take 1,000 mg by mouth 2 (two) times daily.     rosuvastatin (CRESTOR) 40 MG tablet Take 1 tablet (40 mg total) by mouth daily. 90 tablet 1   tadalafil (CIALIS) 20 MG tablet Take 20 mg by mouth daily as needed for erectile dysfunction.     Current Facility-Administered Medications  Medication Dose Route Frequency Provider Last Rate Last Admin   cloNIDine (CATAPRES) tablet 0.1 mg  0.1 mg Oral Once Vigg, Avanti, MD         PHYSICAL EXAMINATION: ECOG PERFORMANCE STATUS: 0 - Asymptomatic Vitals:   07/23/21 1301  BP: (!) 146/87  Pulse: 68  Temp: (!) 96.8  F (36 C)   Filed Weights   07/23/21 1301  Weight: 198 lb 3.2 oz (89.9 kg)    Physical Exam Constitutional:      General: He is not in acute distress. HENT:     Head: Normocephalic and atraumatic.  Eyes:     General: No scleral icterus. Cardiovascular:     Rate and Rhythm: Normal rate and regular rhythm.     Heart sounds: Normal heart sounds.  Pulmonary:     Effort: Pulmonary effort is normal. No respiratory distress.     Breath sounds: No wheezing.  Abdominal:     General: Bowel sounds are normal. There is no distension.  Palpations: Abdomen is soft.  Musculoskeletal:        General: No deformity. Normal range of motion.     Cervical back: Normal range of motion and neck supple.  Skin:    General: Skin is warm and dry.     Findings: No erythema or rash.  Neurological:     Mental Status: He is alert and oriented to person, place, and time. Mental status is at baseline.     Cranial Nerves: No cranial nerve deficit.     Coordination: Coordination normal.  Psychiatric:        Mood and Affect: Mood normal.    LABORATORY DATA:  I have reviewed the data as listed Lab Results  Component Value Date   WBC 6.4 06/11/2021   HGB 16.8 06/11/2021   HCT 49.8 06/11/2021   MCV 91 06/11/2021   PLT 240 06/11/2021   Recent Labs    06/11/21 1346  NA 145*  K 4.1  CL 103  CO2 24  GLUCOSE 87  BUN 11  CREATININE 1.04  CALCIUM 9.5  PROT 7.3  ALBUMIN 5.0*  AST 21  ALT 25  ALKPHOS 103  BILITOT 0.4    Iron/TIBC/Ferritin/ %Sat No results found for: IRON, TIBC, FERRITIN, IRONPCTSAT    RADIOGRAPHIC STUDIES: I have personally reviewed the radiological images as listed and agreed with the findings in the report. No results found.    ASSESSMENT & PLAN:  1. History of DVT (deep vein thrombosis)    History of DVT/PE. Based on the clinical history patient provided, both episodes are likely provoked by surgery/hospitalization and COVID-19 infection. Patient has been off  anticoagulation since March 2022 and has been doing well. Hypercoagulable work-up showed negative antiphospholipid syndrome panel, normal protein S antigen, normal protein C activity, normal Antithrombin 3 level, negative factor V Leiden mutation and negative prothrombin gene mutation. I do not think that patient will benefit from long-term anticoagulation given that both previous events were provoked. I recommend patient to continue aspirin 81 mg daily. We discussed about the red flags symptoms of DVT and pulmonary embolism. I also recommend patient to increase aspirin to 162 mg daily if he anticipates any immobilization events, i.e., long distance car ride, airplane travel.  I also recommend patient to get prophylactic anticoagulation if he gets admitted to the hospital or need surgery done.  Patient reports that he needs to get colonoscopy every 3 years.  He does not know the reason of frequent colonoscopy. Recommend patient to further discuss with primary care provider and be referred to gastroenterology.  Patient Will be discharged and recommend patient to continue follow-up with PCP.  He agrees with the plan.  He is welcome to come back and reestablish care if he develops new related symptoms and concerns.   All questions were answered. The patient knows to call the clinic with any problems questions or concerns.  cc Loura Pardon, MD     Rickard Patience, MD, PhD  07/23/2021

## 2021-08-12 ENCOUNTER — Other Ambulatory Visit: Payer: Self-pay

## 2021-08-12 MED ORDER — RANOLAZINE ER 1000 MG PO TB12: 1000.0000 mg | ORAL_TABLET | Freq: Two times a day (BID) | ORAL | 0 refills | Status: DC

## 2021-08-12 NOTE — Telephone Encounter (Signed)
*  STAT* If patient is at the pharmacy, call can be transferred to refill team.   1. Which medications need to be refilled? (please list name of each medication and dose if known)Ranexa and Nitroglycering  2. Which pharmacy/location (including street and city if local pharmacy) is medication to be sent to? [facility]  30 day or 90 day supply? 90

## 2021-08-12 NOTE — Telephone Encounter (Signed)
Please advise if OK to refill Nitro. Not on med list but patient does have hx of CAD. Thank you!

## 2021-08-28 ENCOUNTER — Other Ambulatory Visit: Payer: BC Managed Care – PPO

## 2021-09-18 ENCOUNTER — Other Ambulatory Visit: Payer: Self-pay

## 2021-09-18 ENCOUNTER — Ambulatory Visit (INDEPENDENT_AMBULATORY_CARE_PROVIDER_SITE_OTHER): Payer: BC Managed Care – PPO

## 2021-09-18 DIAGNOSIS — I2510 Atherosclerotic heart disease of native coronary artery without angina pectoris: Secondary | ICD-10-CM

## 2021-09-18 LAB — ECHOCARDIOGRAM COMPLETE
AR max vel: 3.53 cm2
AV Area VTI: 3.29 cm2
AV Area mean vel: 3.13 cm2
AV Mean grad: 4 mmHg
AV Peak grad: 6.6 mmHg
Ao pk vel: 1.28 m/s
Area-P 1/2: 3.2 cm2
Calc EF: 50.7 %
P 1/2 time: 628 msec
S' Lateral: 3.1 cm
Single Plane A2C EF: 55.1 %
Single Plane A4C EF: 50.3 %

## 2021-09-24 ENCOUNTER — Encounter: Payer: Self-pay | Admitting: Cardiology

## 2021-09-24 ENCOUNTER — Other Ambulatory Visit: Payer: Self-pay

## 2021-09-24 ENCOUNTER — Ambulatory Visit: Payer: BC Managed Care – PPO | Admitting: Cardiology

## 2021-09-24 VITALS — BP 140/70 | HR 66 | Ht 67.5 in | Wt 199.0 lb

## 2021-09-24 DIAGNOSIS — I10 Essential (primary) hypertension: Secondary | ICD-10-CM

## 2021-09-24 DIAGNOSIS — E7800 Pure hypercholesterolemia, unspecified: Secondary | ICD-10-CM

## 2021-09-24 DIAGNOSIS — I2510 Atherosclerotic heart disease of native coronary artery without angina pectoris: Secondary | ICD-10-CM

## 2021-09-24 MED ORDER — NITROGLYCERIN 0.4 MG SL SUBL: 0.4000 mg | SUBLINGUAL_TABLET | SUBLINGUAL | 3 refills | Status: AC | PRN

## 2021-09-24 MED ORDER — CARVEDILOL 6.25 MG PO TABS: 6.2500 mg | ORAL_TABLET | Freq: Two times a day (BID) | ORAL | 3 refills | Status: DC

## 2021-09-24 NOTE — Patient Instructions (Addendum)
Medication Instructions:   Your physician has recommended you make the following change in your medication:    STOP taking Metoprolol Tartrate.  2.    START taking Carvedilol (Coreg) 6.25 MG twice a day.  *If you need a refill on your cardiac medications before your next appointment, please call your pharmacy*   Lab Work: None ordered If you have labs (blood work) drawn today and your tests are completely normal, you will receive your results only by: MyChart Message (if you have MyChart) OR A paper copy in the mail If you have any lab test that is abnormal or we need to change your treatment, we will call you to review the results.   Testing/Procedures: None ordered   Follow-Up: At [facility], you and your health needs are our priority.  As part of our continuing mission to provide you with exceptional heart care, we have created designated Provider Care Teams.  These Care Teams include your primary Cardiologist (physician) and Advanced Practice Providers (APPs -  Physician Assistants and Nurse Practitioners) who all work together to provide you with the care you need, when you need it.  We recommend signing up for the patient portal called "MyChart".  Sign up information is provided on this After Visit Summary.  MyChart is used to connect with patients for Virtual Visits (Telemedicine).  Patients are able to view lab/test results, encounter notes, upcoming appointments, etc.  Non-urgent messages can be sent to your provider as well.   To learn more about what you can do with MyChart, go to [email or web address].    Your next appointment:   3-4 Months   The format for your next appointment:   In Person  Provider:   You may see Debbe Odea, MD or one of the following Advanced Practice Providers on your designated Care Team:   Nicolasa Ducking, NP Eula Listen, PA-C    Other Instructions

## 2021-09-24 NOTE — Progress Notes (Signed)
Cardiology Office Note:    Date:  09/24/2021   ID:  Jaime [name], DOB 22-Sep-1960, MRN TO:[id number]  PCP:  Charlynne Cousins, MD   [facility] Providers Cardiologist:  Kate Sable, MD     Referring MD: Charlynne Cousins, MD   No chief complaint on file.   History of Present Illness:    Jaime [name] is a 61 y.o. male with a hx of CAD s/p PCI to LAD in 2012, hypertension, hyperlipidemia, DVT who presents for follow-up.  He was last seen to establish care.  Echocardiogram was ordered to evaluate cardiac function due to history of CAD.  States blood pressure at home usually in the 140s to 150s.  Feels okay, denies chest pain or shortness of breath.  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  He was treated with Coumadin for a year.  Diagnosed with COVID-19 04/27/2020, developed right lower extremity DVT.  Was started on Eliquis.  He was told he would likely be on Eliquis long-term.  He took Eliquis for about 30 days and then stop.  Did not refill.  Being seen by hematology.  Past Medical History:  Diagnosis Date   Allergy    Clotting disorder ([place])    GERD (gastroesophageal reflux disease)    History of heart attack 2012   Hyperlipidemia    Hypertension     Past Surgical History:  Procedure Laterality Date   CORONARY ANGIOPLASTY WITH STENT PLACEMENT  2012   REPLACEMENT TOTAL KNEE BILATERAL Bilateral    2017 left knee, 2019 right knee   WRIST SURGERY Left    Carpel Tunnel    Current Medications: Current Meds  Medication Sig   amLODipine (NORVASC) 10 MG tablet Take 1 tablet (10 mg total) by mouth daily.   aspirin 81 MG EC tablet Take 1 tablet (81 mg total) by mouth daily. Swallow whole.   carvedilol (COREG) 6.25 MG tablet Take 1 tablet (6.25 mg total) by mouth 2 (two) times daily.   ezetimibe (ZETIA) 10 MG tablet Take 1 tablet (10 mg total) by mouth daily.   hydrOXYzine (VISTARIL) 25 MG capsule Take 1 capsule (25 mg total) by mouth at  bedtime.   icosapent Ethyl (VASCEPA) 1 g capsule Take 2 capsules (2 g total) by mouth 2 (two) times daily.   losartan (COZAAR) 100 MG tablet Take 1 tablet (100 mg total) by mouth daily.   montelukast (SINGULAIR) 10 MG tablet Take 10 mg by mouth daily.   nitroGLYCERIN (NITROSTAT) 0.4 MG SL tablet Place 1 tablet (0.4 mg total) under the tongue every 5 (five) minutes as needed for chest pain. For a maximum of 3 doses in 24 hours.   pantoprazole (PROTONIX) 40 MG tablet Take 1 tablet (40 mg total) by mouth in the morning and at bedtime.   ranolazine (RANEXA) 1000 MG SR tablet Take 1 tablet (1,000 mg total) by mouth 2 (two) times daily.   rosuvastatin (CRESTOR) 40 MG tablet Take 1 tablet (40 mg total) by mouth daily.   tadalafil (CIALIS) 20 MG tablet Take 20 mg by mouth daily as needed for erectile dysfunction.   [DISCONTINUED] metoprolol tartrate (LOPRESSOR) 25 MG tablet Take 0.5 tablets (12.5 mg total) by mouth at bedtime.   Current Facility-Administered Medications for the 09/24/21 encounter (Office Visit) with Kate Sable, MD  Medication   cloNIDine (CATAPRES) tablet 0.1 mg     Allergies:   Codeine   Social History   Socioeconomic History   Marital  status: Married    Spouse name: Gladais   Number of children: Not on file   Years of education: Not on file   Highest education level: Not on file  Occupational History   Not on file  Tobacco Use   Smoking status: Never   Smokeless tobacco: Never  Vaping Use   Vaping Use: Never used  Substance and Sexual Activity   Alcohol use: Yes    Comment: RARE- 1 to 2 a month   Drug use: Never   Sexual activity: Yes    Birth control/protection: None  Other Topics Concern   Not on file  Social History Narrative   Not on file   Social Determinants of Health   Financial Resource Strain: Not on file  Food Insecurity: Not on file  Transportation Needs: Not on file  Physical Activity: Not on file  Stress: Not on file  Social  Connections: Not on file     Family History: The patient's family history includes Heart attack in his paternal aunt; Pancreatic cancer in his mother.  ROS:   Please see the history of present illness.     All other systems reviewed and are negative.  EKGs/Labs/Other Studies Reviewed:    The following studies were reviewed today:   EKG:  EKG is  ordered today.  The ekg ordered today demonstrates sinus bradycardia, otherwise normal.  Recent Labs: 06/11/2021: ALT 25; BUN 11; Creatinine, Ser 1.04; Hemoglobin 16.8; Platelets 240; Potassium 4.1; Sodium 145  Recent Lipid Panel    Component Value Date/Time   CHOL 112 07/22/2021 0740   TRIG 61 07/22/2021 0740   HDL 39 (L) 07/22/2021 0740   CHOLHDL 2.9 07/22/2021 0740   LDLCALC 60 07/22/2021 0740     Risk Assessment/Calculations:          Physical Exam:    VS:  BP 140/70 (BP Location: Left Arm, Patient Position: Sitting, Cuff Size: Normal)    Pulse 66    Ht 5' 7.5" (1.715 m)    Wt 199 lb (90.3 kg)    SpO2 98%    BMI 30.71 kg/m     Wt Readings from Last 3 Encounters:  09/24/21 199 lb (90.3 kg)  07/23/21 198 lb 3.2 oz (89.9 kg)  07/15/21 190 lb (86.2 kg)     GEN:  Well nourished, well developed in no acute distress HEENT: Normal NECK: No JVD; No carotid bruits LYMPHATICS: No lymphadenopathy CARDIAC: Regular, bradycardic. RESPIRATORY:  Clear to auscultation without rales, wheezing or rhonchi  ABDOMEN: Soft, non-tender, non-distended MUSCULOSKELETAL:  No edema; No deformity  SKIN: Warm and dry NEUROLOGIC:  Alert and oriented x 3 PSYCHIATRIC:  Normal affect   ASSESSMENT:    1. Coronary artery disease involving native heart without angina pectoris, unspecified vessel or lesion type   2. Primary hypertension   3. Pure hypercholesterolemia     PLAN:    In order of problems listed above:  CAD s/p PCI to LAD 2012.  Denies chest pain or shortness of breath.  Get echocardiogram.  Continue aspirin, Crestor, stop  Lopressor, start Coreg. Hypertension, BP elevated.  Stop Lopressor, start Coreg 6.25 mg twice daily.  Continue Norvasc, losartan.  Low-salt diet advised.  Consider starting HCTZ if BP still elevated. Hyperlipidemia, LDL at goal.  Continue Zetia, Crestor, Vascepa.  Follow-up in 3 months.      Medication Adjustments/Labs and Tests Ordered: Current medicines are reviewed at length with the patient today.  Concerns regarding medicines are outlined above.  No  orders of the defined types were placed in this encounter.   Meds ordered this encounter  Medications   carvedilol (COREG) 6.25 MG tablet    Sig: Take 1 tablet (6.25 mg total) by mouth 2 (two) times daily.    Dispense:  60 tablet    Refill:  3   nitroGLYCERIN (NITROSTAT) 0.4 MG SL tablet    Sig: Place 1 tablet (0.4 mg total) under the tongue every 5 (five) minutes as needed for chest pain. For a maximum of 3 doses in 24 hours.    Dispense:  30 tablet    Refill:  3     Patient Instructions  Medication Instructions:   Your physician has recommended you make the following change in your medication:    STOP taking Metoprolol Tartrate.  2.    START taking Carvedilol (Coreg) 6.25 MG twice a day.  *If you need a refill on your cardiac medications before your next appointment, please call your pharmacy*   Lab Work: None ordered If you have labs (blood work) drawn today and your tests are completely normal, you will receive your results only by: [place] (if you have MyChart) OR A paper copy in the mail If you have any lab test that is abnormal or we need to change your treatment, we will call you to review the results.   Testing/Procedures: None ordered   Follow-Up: At [facility], you and your health needs are our priority.  As part of our continuing mission to provide you with exceptional heart care, we have created designated Provider Care Teams.  These Care Teams include your primary Cardiologist (physician)  and Advanced Practice Providers (APPs -  Physician Assistants and Nurse Practitioners) who all work together to provide you with the care you need, when you need it.  We recommend signing up for the patient portal called "MyChart".  Sign up information is provided on this After Visit Summary.  MyChart is used to connect with patients for Virtual Visits (Telemedicine).  Patients are able to view lab/test results, encounter notes, upcoming appointments, etc.  Non-urgent messages can be sent to your provider as well.   To learn more about what you can do with MyChart, go to [email or web address].    Your next appointment:   3-4 Months   The format for your next appointment:   In Person  Provider:   You may see Kate Sable, MD or one of the following Advanced Practice Providers on your designated Care Team:   Murray Hodgkins, NP Christell Faith, PA-C    Other Instructions     Signed, Kate Sable, MD  09/24/2021 9:46 AM    [place]

## 2021-10-14 ENCOUNTER — Other Ambulatory Visit: Payer: Self-pay

## 2021-10-14 MED ORDER — PANTOPRAZOLE SODIUM 40 MG PO TBEC: 40.0000 mg | DELAYED_RELEASE_TABLET | Freq: Two times a day (BID) | ORAL | 0 refills | Status: DC

## 2021-11-12 ENCOUNTER — Other Ambulatory Visit: Payer: Self-pay

## 2021-11-12 MED ORDER — RANOLAZINE ER 1000 MG PO TB12: 1000.0000 mg | ORAL_TABLET | Freq: Two times a day (BID) | ORAL | 0 refills | Status: DC

## 2021-12-18 ENCOUNTER — Emergency Department: Payer: BC Managed Care – PPO

## 2021-12-18 ENCOUNTER — Other Ambulatory Visit: Payer: Self-pay

## 2021-12-18 ENCOUNTER — Emergency Department
Admission: EM | Admit: 2021-12-18 | Discharge: 2021-12-18 | Disposition: A | Discharge status: Home / Self Care | Payer: BC Managed Care – PPO | Attending: Emergency Medicine | Admitting: Emergency Medicine

## 2021-12-18 DIAGNOSIS — N200 Calculus of kidney: Secondary | ICD-10-CM | POA: Diagnosis not present

## 2021-12-18 DIAGNOSIS — R1084 Generalized abdominal pain: Secondary | ICD-10-CM | POA: Diagnosis present

## 2021-12-18 LAB — URINALYSIS, ROUTINE W REFLEX MICROSCOPIC
Bilirubin Urine: NEGATIVE
Glucose, UA: NEGATIVE mg/dL
Ketones, ur: NEGATIVE mg/dL
Leukocytes,Ua: NEGATIVE
Nitrite: NEGATIVE
Protein, ur: 100 mg/dL — AB
RBC / HPF: >50 RBC/hpf — ABNORMAL HIGH (ref 0–5)
Specific Gravity, Urine: 1.024 (ref 1.005–1.030)
Squamous Epithelial / HPF: NONE SEEN (ref 0–5)
pH: 5 (ref 5.0–8.0)

## 2021-12-18 LAB — COMPREHENSIVE METABOLIC PANEL
ALT: 31 U/L (ref 0–44)
AST: 24 U/L (ref 15–41)
Albumin: 4.3 g/dL (ref 3.5–5.0)
Alkaline Phosphatase: 66 U/L (ref 38–126)
Anion gap: 9 (ref 5–15)
BUN: 22 mg/dL — ABNORMAL HIGH (ref 6–20)
CO2: 23 mmol/L (ref 22–32)
Calcium: 9.2 mg/dL (ref 8.9–10.3)
Chloride: 105 mmol/L (ref 98–111)
Creatinine, Ser: 1.39 mg/dL — ABNORMAL HIGH (ref 0.61–1.24)
GFR, Estimated: 58 mL/min — ABNORMAL LOW (ref >60)
Glucose, Bld: 130 mg/dL — ABNORMAL HIGH (ref 70–99)
Potassium: 3.8 mmol/L (ref 3.5–5.1)
Sodium: 137 mmol/L (ref 135–145)
Total Bilirubin: 0.8 mg/dL (ref 0.3–1.2)
Total Protein: 7.5 g/dL (ref 6.5–8.1)

## 2021-12-18 LAB — CBC
HCT: 46.1 % (ref 39.0–52.0)
Hemoglobin: 15.6 g/dL (ref 13.0–17.0)
MCH: 30.2 pg (ref 26.0–34.0)
MCHC: 33.8 g/dL (ref 30.0–36.0)
MCV: 89.3 fL (ref 80.0–100.0)
Platelets: 192 10*3/uL (ref 150–400)
RBC: 5.16 MIL/uL (ref 4.22–5.81)
RDW: 12.7 % (ref 11.5–15.5)
WBC: 13.7 10*3/uL — ABNORMAL HIGH (ref 4.0–10.5)
nRBC: 0 % (ref 0.0–0.2)

## 2021-12-18 LAB — LIPASE, BLOOD: Lipase: 27 U/L (ref 11–51)

## 2021-12-18 MED ORDER — ONDANSETRON 4 MG PO TBDP: 4.0000 mg | ORAL_TABLET | Freq: Three times a day (TID) | ORAL | 0 refills | Status: AC | PRN

## 2021-12-18 MED ORDER — OXYCODONE-ACETAMINOPHEN 5-325 MG PO TABS
1.0000 | ORAL_TABLET | ORAL | Status: AC | PRN
  Administered 2021-12-18 (×2): 1 via ORAL
  Filled 2021-12-18 (×2): qty 1

## 2021-12-18 MED ORDER — OXYCODONE HCL 5 MG PO TABS: 5.0000 mg | ORAL_TABLET | Freq: Four times a day (QID) | ORAL | 0 refills | Status: AC | PRN

## 2021-12-18 MED ORDER — CEPHALEXIN 500 MG PO CAPS
500.0000 mg | ORAL_CAPSULE | Freq: Once | ORAL | Status: AC
  Administered 2021-12-18: 500 mg via ORAL
  Filled 2021-12-18: qty 1

## 2021-12-18 MED ORDER — IOHEXOL 300 MG/ML  SOLN
100.0000 mL | Freq: Once | INTRAMUSCULAR | Status: AC | PRN
  Administered 2021-12-18: 100 mL via INTRAVENOUS
  Filled 2021-12-18: qty 100

## 2021-12-18 MED ORDER — SODIUM CHLORIDE 0.9 % IV BOLUS
1000.0000 mL | Freq: Once | INTRAVENOUS | Status: AC
  Administered 2021-12-18: 1000 mL via INTRAVENOUS

## 2021-12-18 MED ORDER — TAMSULOSIN HCL 0.4 MG PO CAPS
0.4000 mg | ORAL_CAPSULE | Freq: Every day | ORAL | 0 refills | Status: AC
Start: 2021-12-18 — End: 2022-01-01

## 2021-12-18 MED ORDER — CEPHALEXIN 500 MG PO CAPS
500.0000 mg | ORAL_CAPSULE | Freq: Two times a day (BID) | ORAL | 0 refills | Status: AC
Start: 2021-12-18 — End: 2021-12-25

## 2021-12-18 NOTE — ED Provider Notes (Signed)
? ?[facility] ?Provider Note ? ? ? Event Date/Time  ? First MD Initiated Contact with Patient 12/18/21 1706   ?  (approximate) ? ? ?History  ? ?Abdominal Pain ? ? ?HPI ? ?Jaime [name] is a 61 y.o. male with coronary disease, history of DVT who comes in generalized abdominal pain and back pain.  Patient reports pain all over his abdomen and back that started today as well as difficulty with some hematuria.  Denies ever having this previously.  No history of kidney stones.  Reports pain is severe but got better with oxycodone. ? ?  ? ? ?Physical Exam  ? ?Triage Vital Signs: ?ED Triage Vitals  ?Enc Vitals Group  ?   BP 12/18/21 1449 (!) 152/95  ?   Pulse Rate 12/18/21 1449 87  ?   Resp 12/18/21 1449 18  ?   Temp 12/18/21 1449 97.9 ?F (36.6 ?C)  ?   Temp Source 12/18/21 1449 Oral  ?   SpO2 12/18/21 1449 96 %  ?   Weight 12/18/21 1450 200 lb (90.7 kg)  ?   Height 12/18/21 1733 5' 7.5" (1.715 m)  ?   Head Circumference --   ?   Peak Flow --   ?   Pain Score 12/18/21 1450 10  ?   Pain Loc --   ?   Pain Edu? --   ?   Excl. in [place]? --   ? ? ?Most recent vital signs: ?Vitals:  ? 12/18/21 1449 12/18/21 1707  ?BP: (!) 152/95 134/84  ?Pulse: 87 89  ?Resp: 18 19  ?Temp: 97.9 ?F (36.6 ?C)   ?SpO2: 96% 98%  ? ? ? ?General: Awake, no distress.  ?CV:  Good peripheral perfusion.  ?Resp:  Normal effort.  ?Abd:  No distention.  Tender throughout her abdomen ? ? ? ?ED Results / Procedures / Treatments  ? ?Labs ?(all labs ordered are listed, but only abnormal results are displayed) ?Labs Reviewed  ?COMPREHENSIVE METABOLIC PANEL - Abnormal; Notable for the following components:  ?    Result Value  ? Glucose, Bld 130 (*)   ? BUN 22 (*)   ? Creatinine, Ser 1.39 (*)   ? GFR, Estimated 58 (*)   ? All other components within normal limits  ?CBC - Abnormal; Notable for the following components:  ? WBC 13.7 (*)   ? All other components within normal limits  ?URINALYSIS, ROUTINE W REFLEX MICROSCOPIC - Abnormal; Notable for  the following components:  ? Color, Urine YELLOW (*)   ? APPearance TURBID (*)   ? Hgb urine dipstick LARGE (*)   ? Protein, ur 100 (*)   ? RBC / HPF >50 (*)   ? Bacteria, UA RARE (*)   ? All other components within normal limits  ?LIPASE, BLOOD  ? ? ? ?EKG ? ?My interpretation of EKG: ? ?Normal sinus rate of 81 without any ST elevation, T wave inversion, normal intervals ? ?RADIOLOGY ?I have reviewed the CT patient has a kidney stone on the left ? ?  ?IMPRESSION: ?1. Obstructive 4 mm left ureterolithiasis. Correlate with urinalysis ?for superimposed infection. ?2. Prostatomegaly. ?3.  Aortic Atherosclerosis (ICD10-I70.0). ? ? ? ?PROCEDURES: ? ?Critical Care performed: No ? ?Procedures ? ? ?MEDICATIONS ORDERED IN ED: ?Medications  ?oxyCODONE-acetaminophen (PERCOCET/ROXICET) 5-325 MG per tablet 1 tablet (1 tablet Oral Given 12/18/21 1458)  ?iohexol (OMNIPAQUE) 300 MG/ML solution 100 mL (100 mLs Intravenous Contrast Given 12/18/21 1754)  ? ? ? ?IMPRESSION /  MDM / ASSESSMENT AND PLAN / ED COURSE  ?I reviewed the triage vital signs and the nursing notes. ?             ?               ? ?Patient's symptoms are concerning for kidney stone, appendicitis, perforation, pyelonephritis, mass.  Will get CT imaging to further evaluate.  Patient was given oxycodone to help with pain. ? ?White count is elevated.  Lipase normal.  CMP shows slightly elevated creatinine we will give some fluids.  Urine with greater than 50 RBC ? ? ?Patient CT scan concerning for obstructive kidney stone on the left.  Given patient's elevated white count I did discuss case with Dr. Erlene Quan from urology.  She doubts there is a superinfection given patient been afebrile x2 and UA is overall pretty reassuring but to be conservative we discussed starting on some Keflex.  His creatinine was slightly elevated but getting some fluids.  Urine culture was sent.  They are going to follow him up in the office. ? ?Reevaluated patient and pain is well controlled.   Patient is comfortable with taking the antibiotics and will return if he develops any fevers.   ? ?Discussed precautions in regards to opioids. ? ?I discussed the provisional nature of ED diagnosis, the treatment so far, the ongoing plan of care, follow up appointments and return precautions with the patient and any family or support people present. They expressed understanding and agreed with the plan, discharged home. ? ? ? ? ? ?FINAL CLINICAL IMPRESSION(S) / ED DIAGNOSES  ? ?Final diagnoses:  ?Kidney stone  ? ? ? ?Rx / DC Orders  ? ?ED Discharge Orders   ? ? None  ? ?  ? ? ? ?Note:  This document was prepared using Dragon voice recognition software and may include unintentional dictation errors. ?  ?Vanessa Greenup, MD ?12/18/21 1907 ? ?

## 2021-12-18 NOTE — Discharge Instructions (Addendum)
You have a kidney stone. See report below.   ?Take tylenol 1g every 8 hours daily.  ?You can occasionally use ibuprofen 400 no more than twice a day due to your kidney function being slightly elevated ?Take oxycodone for breakthrough pain. Do not drive, work, or operate machinery while on this.  ?Take antibiotics to prevent infection ?Take zofran to help with nausea. ?Take Flomax to help dilate uretha. ? ?Call urology number above to schedule outpatient appointment. ?Return to ED for fevers, unable to keep food down, or any other concerns.  ? ? ?Take oxycodone as prescribed. Do not drink alcohol, drive or participate in any other potentially dangerous activities while taking this medication as it may make you sleepy. Do not take this medication with any other sedating medications, either prescription or over-the-counter. If you were prescribed Percocet or Vicodin, do not take these with acetaminophen (Tylenol) as it is already contained within these medications. ? ?This medication is an opiate (or narcotic) pain medication and can be habit forming. Use it as little as possible to achieve adequate pain control. Do not use or use it with extreme caution if you have a history of opiate abuse or dependence. If you are on a pain contract with your primary care doctor or a pain specialist, be sure to let them know you were prescribed this medication today from the Emergency Department. This medication is intended for your use only - do not give any to anyone else and keep it in a secure place where nobody else, especially children, have access to it. ? ? ? ?

## 2021-12-18 NOTE — ED Triage Notes (Signed)
Pt comes pov with generalized abd pain and back pain, hematuria since today. Hx of diverticulitis but unsure if it's the same. No hx of stones.  ?

## 2021-12-20 LAB — URINE CULTURE: Culture: NO GROWTH

## 2021-12-23 ENCOUNTER — Encounter: Payer: Self-pay | Admitting: Urology

## 2021-12-23 ENCOUNTER — Ambulatory Visit: Payer: BC Managed Care – PPO | Admitting: Urology

## 2021-12-23 VITALS — BP 145/80 | HR 59 | Ht 67.0 in | Wt 200.0 lb

## 2021-12-23 DIAGNOSIS — N23 Unspecified renal colic: Secondary | ICD-10-CM | POA: Diagnosis not present

## 2021-12-23 DIAGNOSIS — N201 Calculus of ureter: Secondary | ICD-10-CM

## 2021-12-23 DIAGNOSIS — N132 Hydronephrosis with renal and ureteral calculous obstruction: Secondary | ICD-10-CM

## 2021-12-23 NOTE — Progress Notes (Signed)
? ?12/23/2021 ?10:01 AM  ? ?Jaime [name] ?April 28, 1961 ?[id number] ? ?Referring provider: Charlynne Cousins, MD ?[address] [place],  [place] [address] ? ?Chief Complaint  ?Patient presents with  ? Nephrolithiasis  ? ? ?HPI: ?Jaime [name] is a 61 y.o. male who presents in follow-up of recent ED visit for renal colic. ? ?[facility] ED visit 12/18/2021 with complaints of generalized abdominal and back pain with hematuria ?CT with a 4 mm left distal ureteral calculus ?UA >50 RBC; urine culture negative ?Was discharged on oxycodone, ondansetron, tamsulosin and cephalexin ?Since ED visit he has only taken 1 oxycodone.  Over the weekend he did have an episode of nausea and vomiting and took Zofran ?Denies fever, chills ?No previous history of stone disease ?Is having frequency, urgency and voiding small amounts ? ? ?PMH: ?Past Medical History:  ?Diagnosis Date  ? Allergy   ? Clotting disorder ([place])   ? GERD (gastroesophageal reflux disease)   ? History of heart attack 2012  ? Hyperlipidemia   ? Hypertension   ? ? ?Surgical History: ?Past Surgical History:  ?Procedure Laterality Date  ? CORONARY ANGIOPLASTY WITH STENT PLACEMENT  2012  ? REPLACEMENT TOTAL KNEE BILATERAL Bilateral   ? 2017 left knee, 2019 right knee  ? WRIST SURGERY Left   ? Carpel Tunnel  ? ? ?Home Medications:  ?Allergies as of 12/23/2021   ? ?   Reactions  ? Codeine Other (See Comments)  ? "Black outs"  ? ?  ? ?  ?Medication List  ?  ? ?  ? Accurate as of December 23, 2021 10:01 AM. If you have any questions, ask your nurse or doctor.  ?  ?  ? ?  ? ?amLODipine 10 MG tablet ?Commonly known as: NORVASC ?Take 1 tablet (10 mg total) by mouth daily. ?  ?aspirin 81 MG EC tablet ?Take 1 tablet (81 mg total) by mouth daily. Swallow whole. ?  ?carvedilol 6.25 MG tablet ?Commonly known as: COREG ?Take 1 tablet (6.25 mg total) by mouth 2 (two) times daily. ?  ?cephALEXin 500 MG capsule ?Commonly known as: KEFLEX ?Take 1 capsule (500 mg total) by mouth 2 (two) times daily for 7 days. ?   ?ezetimibe 10 MG tablet ?Commonly known as: ZETIA ?Take 1 tablet (10 mg total) by mouth daily. ?  ?hydrOXYzine 25 MG capsule ?Commonly known as: VISTARIL ?Take 1 capsule (25 mg total) by mouth at bedtime. ?  ?icosapent Ethyl 1 g capsule ?Commonly known as: VASCEPA ?Take 2 capsules (2 g total) by mouth 2 (two) times daily. ?  ?losartan 100 MG tablet ?Commonly known as: COZAAR ?Take 1 tablet (100 mg total) by mouth daily. ?  ?montelukast 10 MG tablet ?Commonly known as: SINGULAIR ?Take 10 mg by mouth daily. ?  ?nitroGLYCERIN 0.4 MG SL tablet ?Commonly known as: NITROSTAT ?Place 1 tablet (0.4 mg total) under the tongue every 5 (five) minutes as needed for chest pain. For a maximum of 3 doses in 24 hours. ?  ?oxyCODONE 5 MG immediate release tablet ?Commonly known as: Roxicodone ?Take 1 tablet (5 mg total) by mouth every 6 (six) hours as needed for up to 5 days. ?  ?pantoprazole 40 MG tablet ?Commonly known as: PROTONIX ?Take 1 tablet (40 mg total) by mouth in the morning and at bedtime. ?  ?ranolazine 1000 MG SR tablet ?Commonly known as: RANEXA ?Take 1 tablet (1,000 mg total) by mouth 2 (two) times daily. ?  ?rosuvastatin 40 MG tablet ?Commonly known as: CRESTOR ?  Take 1 tablet (40 mg total) by mouth daily. ?  ?tadalafil 20 MG tablet ?Commonly known as: CIALIS ?Take 20 mg by mouth daily as needed for erectile dysfunction. ?  ?tamsulosin 0.4 MG Caps capsule ?Commonly known as: FLOMAX ?Take 1 capsule (0.4 mg total) by mouth daily for 14 days. ?  ? ?  ? ? ?Allergies:  ?Allergies  ?Allergen Reactions  ? Codeine Other (See Comments)  ?  "Black outs"  ? ? ?Family History: ?Family History  ?Problem Relation Age of Onset  ? Pancreatic cancer Mother   ? Heart attack Paternal Aunt   ? ? ?Social History:  reports that he has never smoked. He has never used smokeless tobacco. He reports current alcohol use. He reports that he does not use drugs. ? ? ?Physical Exam: ?BP (!) 145/80   Pulse (!) 59   Ht '5\' 7"'  (1.702 m)   Wt 200 lb  (90.7 kg)   BMI 31.32 kg/m?   ?Constitutional:  Alert and oriented, No acute distress. ?HEENT: [place] AT, moist mucus membranes.  Trachea midline, no masses. ?Respiratory: Normal respiratory effort, no increased work of breathing. ?Neurologic: Grossly intact, no focal deficits, moving all 4 extremities. ?Psychiatric: Normal mood and affect. ? ?Laboratory Data: ? ?Urinalysis ?Dipstick/microscopy negative ? ? ?Pertinent Imaging: ?CT images were reviewed and personally interpreted ? ?Assessment & Plan:   ? ?1.  Left distal ureteral calculus ?4 mm left UVJ stone ?Minimally symptomatic ?We discussed various treatment options for urolithiasis including observation with or without medical expulsive therapy, shockwave lithotripsy (SWL), ureteroscopy and laser lithotripsy with stent placement. ?We discussed that management is based on stone size, location, density, patient co-morbidities, and patient preference.  ?Stones <78m in size have a >80% spontaneous passage rate. Data surrounding the use of tamsulosin for medical expulsive therapy is controversial, but meta analyses suggests it is most efficacious for distal stones between 5-194min size. Possible side effects include dizziness/lightheadedness, and retrograde ejaculation. ?SWL has a lower stone free rate in a single procedure, but also a lower complication rate compared to ureteroscopy and avoids a stent and associated stent related symptoms. Possible complications include renal hematoma, steinstrasse, and need for additional treatment. ?Ureteroscopy with laser lithotripsy and stent placement has a higher stone free rate than SWL in a single procedure, however increased complication rate including possible infection, ureteral injury, bleeding, and stent related morbidity. Common stent related symptoms include dysuria, urgency/frequency, and flank pain. ?After an extensive discussion of the risks and benefits of the above treatment options, the patient would like to  proceed with MET. ?If he has worsening pain and opts for treatment he was instructed to call and we will otherwise schedule a 2-week follow-up with KUB.  He was also given strainer ? ? ?ScAbbie SonsMD ? ?[place] ?[place] [phone] ?

## 2021-12-24 LAB — URINALYSIS, COMPLETE
Bilirubin, UA: NEGATIVE
Glucose, UA: NEGATIVE
Ketones, UA: NEGATIVE
Leukocytes,UA: NEGATIVE
Nitrite, UA: NEGATIVE
Protein,UA: NEGATIVE
RBC, UA: NEGATIVE
Specific Gravity, UA: 1.005 (ref 1.005–1.030)
Urobilinogen, Ur: 0.2 mg/dL (ref 0.2–1.0)
pH, UA: 5 (ref 5.0–7.5)

## 2021-12-24 LAB — MICROSCOPIC EXAMINATION: Bacteria, UA: NONE SEEN

## 2022-01-08 ENCOUNTER — Telehealth: Payer: Self-pay | Admitting: Cardiology

## 2022-01-08 NOTE — Telephone Encounter (Signed)
Called to reschedule - mailbox full  ?May schedule on 10:20a same day or another day ?

## 2022-01-10 ENCOUNTER — Ambulatory Visit: Payer: BC Managed Care – PPO | Admitting: Cardiology

## 2022-01-10 ENCOUNTER — Ambulatory Visit
Admission: RE | Admit: 2022-01-10 | Discharge: 2022-01-10 | Disposition: A | Discharge status: Home / Self Care | Payer: BC Managed Care – PPO | Source: Ambulatory Visit | Attending: Urology | Admitting: Urology

## 2022-01-10 ENCOUNTER — Encounter: Payer: Self-pay | Admitting: Cardiology

## 2022-01-10 ENCOUNTER — Ambulatory Visit: Payer: BC Managed Care – PPO | Admitting: Urology

## 2022-01-10 VITALS — BP 154/90 | HR 61 | Ht 67.0 in | Wt 195.0 lb

## 2022-01-10 DIAGNOSIS — N201 Calculus of ureter: Secondary | ICD-10-CM

## 2022-01-10 DIAGNOSIS — I2510 Atherosclerotic heart disease of native coronary artery without angina pectoris: Secondary | ICD-10-CM

## 2022-01-10 DIAGNOSIS — E7800 Pure hypercholesterolemia, unspecified: Secondary | ICD-10-CM | POA: Diagnosis not present

## 2022-01-10 DIAGNOSIS — I10 Essential (primary) hypertension: Secondary | ICD-10-CM | POA: Diagnosis not present

## 2022-01-10 MED ORDER — HYDROCHLOROTHIAZIDE 25 MG PO TABS: 25.0000 mg | ORAL_TABLET | Freq: Every day | ORAL | 3 refills | Status: DC

## 2022-01-10 NOTE — Patient Instructions (Signed)
Medication Instructions:  ? ?Your physician has recommended you make the following change in your medication:  ? ? START taking Hydrochlorothiazide 25 MG once a day. ? ?*If you need a refill on your cardiac medications before your next appointment, please call your pharmacy* ? ? ?Lab Work: ? ?Your physician recommends that you return for lab work (BMP) in: 2 weeks ? ?- Please go to the [facility]. You will check in at the front desk to the right as you walk into the atrium. Valet Parking is offered if needed. ?- No appointment needed. You may go any day between 7 am and 6 pm. ? ? ?Testing/Procedures: ? ?None ordered ? ? ?Follow-Up: ?At [facility], you and your health needs are our priority.  As part of our continuing mission to provide you with exceptional heart care, we have created designated Provider Care Teams.  These Care Teams include your primary Cardiologist (physician) and Advanced Practice Providers (APPs -  Physician Assistants and Nurse Practitioners) who all work together to provide you with the care you need, when you need it. ? ?We recommend signing up for the patient portal called "MyChart".  Sign up information is provided on this After Visit Summary.  MyChart is used to connect with patients for Virtual Visits (Telemedicine).  Patients are able to view lab/test results, encounter notes, upcoming appointments, etc.  Non-urgent messages can be sent to your provider as well.   ?To learn more about what you can do with MyChart, go to [email or web address].   ? ?Your next appointment:   ?3 month(s) ? ?The format for your next appointment:   ?In Person ? ?Provider:   ?You may see Debbe Odea, MD or one of the following Advanced Practice Providers on your designated Care Team:   ?Nicolasa Ducking, NP ?Eula Listen, PA-C ?Cadence Fransico Michael, PA-C  ? ? ?Other Instructions ? ? ?Important Information About Sugar ? ? ? ? ? ? ?

## 2022-01-10 NOTE — Progress Notes (Signed)
?Cardiology Office Note:   ? ?Date:  01/10/2022  ? ?ID:  Jaime [name], DOB [birth year], MRN [id number] ? ?PCP:  Jaime Cousins, MD ?  ?[facility] Providers ?Cardiologist:  Jaime Sable, MD    ? ?Referring MD: Jaime Cousins, MD  ? ?Chief Complaint  ?Patient presents with  ? Other  ?  3-4 month follow up -- patient has a kidney stone that he has not passed. Meds reviewed verbally with patient.   ? ? ?History of Present Illness:   ? ?Jaime [name] is a 61 y.o. male with a hx of CAD s/p PCI to LAD in 2012, hypertension, hyperlipidemia, DVT who presents for follow-up.  Previously seen due to hypertension ? ?Lopressor was stopped, Coreg was started.  He recently developed a kidney stone, following up with urology for this.  He is in constant pain right now.  Also undergoing lots of stress due to dad diagnosed with stage IV lung cancer, daughter having lupus, best friend recently died from brain cancer. ? ? ?Prior notes ?Has a history of left lower extremity DVT after left knee surgery.  He was treated with Coumadin for a year.  Diagnosed with COVID-19 04/27/2020, developed right lower extremity DVT.  Was started on Eliquis.  He was told he would likely be on Eliquis long-term.  He took Eliquis for about 30 days and then stop.  Did not refill.  Being seen by hematology. ? ?Past Medical History:  ?Diagnosis Date  ? Allergy   ? Clotting disorder ([place])   ? GERD (gastroesophageal reflux disease)   ? History of heart attack 2012  ? Hyperlipidemia   ? Hypertension   ? ? ?Past Surgical History:  ?Procedure Laterality Date  ? CORONARY ANGIOPLASTY WITH STENT PLACEMENT  2012  ? REPLACEMENT TOTAL KNEE BILATERAL Bilateral   ? 2017 left knee, 2019 right knee  ? WRIST SURGERY Left   ? Carpel Tunnel  ? ? ?Current Medications: ?Current Meds  ?Medication Sig  ? amLODipine (NORVASC) 10 MG tablet Take 1 tablet (10 mg total) by mouth daily.  ? aspirin 81 MG EC tablet Take 1 tablet (81 mg total) by mouth daily. Swallow whole.  ?  carvedilol (COREG) 6.25 MG tablet Take 1 tablet (6.25 mg total) by mouth 2 (two) times daily.  ? ezetimibe (ZETIA) 10 MG tablet Take 1 tablet (10 mg total) by mouth daily.  ? hydrochlorothiazide (HYDRODIURIL) 25 MG tablet Take 1 tablet (25 mg total) by mouth daily.  ? hydrOXYzine (VISTARIL) 25 MG capsule Take 1 capsule (25 mg total) by mouth at bedtime.  ? icosapent Ethyl (VASCEPA) 1 g capsule Take 2 capsules (2 g total) by mouth 2 (two) times daily.  ? losartan (COZAAR) 100 MG tablet Take 1 tablet (100 mg total) by mouth daily.  ? montelukast (SINGULAIR) 10 MG tablet Take 10 mg by mouth daily.  ? pantoprazole (PROTONIX) 40 MG tablet Take 1 tablet (40 mg total) by mouth in the morning and at bedtime.  ? ranolazine (RANEXA) 1000 MG SR tablet Take 1 tablet (1,000 mg total) by mouth 2 (two) times daily.  ? rosuvastatin (CRESTOR) 40 MG tablet Take 1 tablet (40 mg total) by mouth daily.  ? tadalafil (CIALIS) 20 MG tablet Take 20 mg by mouth daily as needed for erectile dysfunction.  ? ?Current Facility-Administered Medications for the 01/10/22 encounter (Office Visit) with Jaime Sable, MD  ?Medication  ? cloNIDine (CATAPRES) tablet 0.1 mg  ?  ? ?Allergies:   Codeine  ? ?Social  History  ? ?Socioeconomic History  ? Marital status: Married  ?  Spouse name: Jaime [name]  ? Number of children: Not on file  ? Years of education: Not on file  ? Highest education level: Not on file  ?Occupational History  ? Not on file  ?Tobacco Use  ? Smoking status: Never  ? Smokeless tobacco: Never  ?Vaping Use  ? Vaping Use: Never used  ?Substance and Sexual Activity  ? Alcohol use: Yes  ?  Comment: RARE- 1 to 2 a month  ? Drug use: Never  ? Sexual activity: Yes  ?  Birth control/protection: None  ?Other Topics Concern  ? Not on file  ?Social History Narrative  ? Not on file  ? ?Social Determinants of Health  ? ?Financial Resource Strain: Not on file  ?Food Insecurity: Not on file  ?Transportation Needs: Not on file  ?Physical Activity: Not  on file  ?Stress: Not on file  ?Social Connections: Not on file  ?  ? ?Family History: ?The patient's family history includes Heart attack in his paternal aunt; Pancreatic cancer in his mother. ? ?ROS:   ?Please see the history of present illness.    ? All other systems reviewed and are negative. ? ?EKGs/Labs/Other Studies Reviewed:   ? ?The following studies were reviewed today: ? ? ?EKG:  EKG is  ordered today.  The ekg ordered today demonstrates normal sinus rhythm, normal ECG ? ?Recent Labs: ?12/18/2021: ALT 31; BUN 22; Creatinine, Ser 1.39; Hemoglobin 15.6; Platelets 192; Potassium 3.8; Sodium 137  ?Recent Lipid Panel ?   ?Component Value Date/Time  ? CHOL 112 07/22/2021 0740  ? TRIG 61 07/22/2021 0740  ? HDL 39 (L) 07/22/2021 0740  ? CHOLHDL 2.9 07/22/2021 0740  ? [place] 60 07/22/2021 0740  ? ? ? ?Risk Assessment/Calculations:   ? ? ?    ? ?Physical Exam:   ? ?VS:  BP (!) 154/90 (BP Location: Left Arm, Patient Position: Sitting, Cuff Size: Normal)   Pulse 61   Ht 5\' 7"  (1.702 m)   Wt 195 lb (88.5 kg)   SpO2 97%   BMI 30.54 kg/m?    ? ?Wt Readings from Last 3 Encounters:  ?01/10/22 195 lb (88.5 kg)  ?12/23/21 200 lb (90.7 kg)  ?12/18/21 199 lb 15.3 oz (90.7 kg)  ?  ? ?GEN:  Well nourished, well developed in mild to moderate distress ?HEENT: Normal ?NECK: No JVD; No carotid bruits ?CARDIAC: Regular, bradycardic. ?RESPIRATORY:  Clear to auscultation without rales, wheezing or rhonchi  ?ABDOMEN: Soft, non-tender, non-distended ?MUSCULOSKELETAL:  No edema; No deformity  ?SKIN: Warm and dry ?NEUROLOGIC:  Alert and oriented x 3 ?PSYCHIATRIC:  Normal affect  ? ?ASSESSMENT:   ? ?1. Coronary artery disease involving native heart without angina pectoris, unspecified vessel or lesion type   ?2. Primary hypertension   ?3. Pure hypercholesterolemia   ? ? ?PLAN:   ? ?In order of problems listed above: ? ?CAD s/p PCI to LAD 2012.  Denies chest pain or shortness of breath.  Echo 09/2021 EF 55 to 60%..  Continue aspirin,  Crestor,Coreg, Ranexa. ?Hypertension, BP elevated.  Continue Coreg 6.25 mg twice daily.  Continue Norvasc, losartan.  Start hydrochlorothiazide 25 mg daily.  HCTZ has additional benefit of helping to prevent kidney stones. ?Hyperlipidemia, LDL at goal.  Continue Zetia, Crestor, Vascepa. ? ?Follow-up in 3 months. ? ?  ?  ?Medication Adjustments/Labs and Tests Ordered: ?Current medicines are reviewed at length with the patient today.  Concerns  regarding medicines are outlined above.  ?Orders Placed This Encounter  ?Procedures  ? Basic metabolic panel  ? EKG 12-Lead  ? ? ?Meds ordered this encounter  ?Medications  ? hydrochlorothiazide (HYDRODIURIL) 25 MG tablet  ?  Sig: Take 1 tablet (25 mg total) by mouth daily.  ?  Dispense:  30 tablet  ?  Refill:  3  ? ? ? ?Patient Instructions  ?Medication Instructions:  ? ?Your physician has recommended you make the following change in your medication:  ? ? START taking Hydrochlorothiazide 25 MG once a day. ? ?*If you need a refill on your cardiac medications before your next appointment, please call your pharmacy* ? ? ?Lab Work: ? ?Your physician recommends that you return for lab work (BMP) in: 2 weeks ? ?- Please go to the [facility]. You will check in at the front desk to the right as you walk into the atrium. Valet Parking is offered if needed. ?- No appointment needed. You may go any day between 7 am and 6 pm. ? ? ?Testing/Procedures: ? ?None ordered ? ? ?Follow-Up: ?At [facility], you and your health needs are our priority.  As part of our continuing mission to provide you with exceptional heart care, we have created designated Provider Care Teams.  These Care Teams include your primary Cardiologist (physician) and Advanced Practice Providers (APPs -  Physician Assistants and Nurse Practitioners) who all work together to provide you with the care you need, when you need it. ? ?We recommend signing up for the patient portal called "MyChart".  Sign up information  is provided on this After Visit Summary.  MyChart is used to connect with patients for Virtual Visits (Telemedicine).  Patients are able to view lab/test results, encounter notes, upcoming appointments, etc.  Non-urg

## 2022-01-13 ENCOUNTER — Telehealth: Payer: Self-pay | Admitting: *Deleted

## 2022-01-13 NOTE — Telephone Encounter (Signed)
Notified patient as instructed, patient will come to the office on 01/20/2022 ? ?

## 2022-01-13 NOTE — Telephone Encounter (Signed)
-----   Message from Riki Altes, MD sent at 01/12/2022 11:16 AM EDT ----- ?Jaime [name] is still present on x-ray.  It appears to be in the same position and most likely will be able to pass.  If desired can schedule shockwave lithotripsy for this Thursday or he can keep his scheduled follow-up appointment 01/20/2022 ?

## 2022-01-17 ENCOUNTER — Other Ambulatory Visit: Payer: Self-pay | Admitting: *Deleted

## 2022-01-17 DIAGNOSIS — N201 Calculus of ureter: Secondary | ICD-10-CM

## 2022-01-20 ENCOUNTER — Other Ambulatory Visit: Payer: Self-pay | Admitting: *Deleted

## 2022-01-20 ENCOUNTER — Encounter: Payer: Self-pay | Admitting: Urology

## 2022-01-20 ENCOUNTER — Ambulatory Visit: Payer: BC Managed Care – PPO | Admitting: Cardiology

## 2022-01-20 ENCOUNTER — Ambulatory Visit: Payer: BC Managed Care – PPO | Admitting: Urology

## 2022-01-20 VITALS — BP 149/88 | HR 63 | Ht 67.0 in | Wt 198.0 lb

## 2022-01-20 DIAGNOSIS — N201 Calculus of ureter: Secondary | ICD-10-CM

## 2022-01-20 MED ORDER — ICOSAPENT ETHYL 1 G PO CAPS: 2.0000 g | ORAL_CAPSULE | Freq: Two times a day (BID) | ORAL | 3 refills | Status: DC

## 2022-01-20 NOTE — Progress Notes (Signed)
 01/20/2022 9:09 AM   Jaime [name] 11/19/1960 [id number]  Referring provider: Vigg, Avanti, MD [address] [place],  [place] [address]  Chief Complaint  Patient presents with   Nephrolithiasis    HPI: 60 y.o. male who presents follow-up visit.  [facility] ED visit 12/18/2021 with complaints of generalized abdominal and back pain with hematuria CT with a 4 mm left distal ureteral calculus Since last visit has had minimal pain.  Intermittent voiding symptoms KUB 01/10/2022 showed stone still present   PMH: Past Medical History:  Diagnosis Date   Allergy    Clotting disorder (H[facility])    GERD (gastroesophageal reflux disease)    History of heart attack 2012   Hyperlipidemia    Hypertension     Surgical History: Past Surgical History:  Procedure Laterality Date   CORONARY ANGIOPLASTY WITH STENT PLACEMENT  2012   REPLACEMENT TOTAL KNEE BILATERAL Bilateral    2017 left knee, 2019 right knee   WRIST SURGERY Left    Carpel Tunnel    Home Medications:  Allergies as of 01/20/2022       Reactions   Codeine Other (See Comments)   "Black outs"        Medication List        Accurate as of Jan 20, 2022  9:09 AM. If you have any questions, ask your nurse or doctor.          amLODipine 10 MG tablet Commonly known as: NORVASC Take 1 tablet (10 mg total) by mouth daily.   aspirin 81 MG EC tablet Take 1 tablet (81 mg total) by mouth daily. Swallow whole.   carvedilol 6.25 MG tablet Commonly known as: COREG Take 1 tablet (6.25 mg total) by mouth 2 (two) times daily.   ezetimibe 10 MG tablet Commonly known as: ZETIA Take 1 tablet (10 mg total) by mouth daily.   hydrochlorothiazide 25 MG tablet Commonly known as: HYDRODIURIL Take 1 tablet (25 mg total) by mouth daily.   hydrOXYzine 25 MG capsule Commonly known as: VISTARIL Take 1 capsule (25 mg total) by mouth at bedtime.   icosapent Ethyl 1 g capsule Commonly known as: VASCEPA Take 2 capsules (2 g total) by mouth 2  (two) times daily.   losartan 100 MG tablet Commonly known as: COZAAR Take 1 tablet (100 mg total) by mouth daily.   montelukast 10 MG tablet Commonly known as: SINGULAIR Take 10 mg by mouth daily.   nitroGLYCERIN 0.4 MG SL tablet Commonly known as: NITROSTAT Place 1 tablet (0.4 mg total) under the tongue every 5 (five) minutes as needed for chest pain. For a maximum of 3 doses in 24 hours.   pantoprazole 40 MG tablet Commonly known as: PROTONIX Take 1 tablet (40 mg total) by mouth in the morning and at bedtime.   ranolazine 1000 MG SR tablet Commonly known as: RANEXA Take 1 tablet (1,000 mg total) by mouth 2 (two) times daily.   rosuvastatin 40 MG tablet Commonly known as: CRESTOR Take 1 tablet (40 mg total) by mouth daily.   tadalafil 20 MG tablet Commonly known as: CIALIS Take 20 mg by mouth daily as needed for erectile dysfunction.        Allergies:  Allergies  Allergen Reactions   Codeine Other (See Comments)    "Black outs"    Family History: Family History  Problem Relation Age of Onset   Pancreatic cancer Mother    Heart attack Paternal Aunt     Social History:    reports that he has never smoked. He has never used smokeless tobacco. He reports current alcohol use. He reports that he does not use drugs.   Physical Exam: BP (!) 149/88   Pulse 63   Ht 5\' 7"  (1.702 m)   Wt 198 lb (89.8 kg)   BMI 31.01 kg/m   Constitutional:  Alert and oriented, No acute distress. HEENT: [place] AT, moist mucus membranes.  Trachea midline, no masses. Respiratory: Normal respiratory effort, no increased work of breathing. Neurologic: Grossly intact, no focal deficits, moving all 4 extremities. Psychiatric: Normal mood and affect.   Pertinent Imaging: KUB images were reviewed and personally interpreted  Assessment & Plan:    1.  Left distal ureteral calculus 4 mm left UVJ stone.   He desires to schedule SWL.  The procedure was discussed including the possibility of  persistent stone requiring repeat or alternative procedure All questions were answered.    Abbie Sons, [place] [address] [place], [place] [address] [phone]

## 2022-01-20 NOTE — H&P (View-Only) (Signed)
01/20/2022 9:09 AM   Jaime [name] 1961/06/06 [id number]  Referring provider: Loura Pardon, MD [address] [place],  [place] [address]  Chief Complaint  Patient presents with   Nephrolithiasis    HPI: 61 y.o. male who presents follow-up visit.  [facility] ED visit 12/18/2021 with complaints of generalized abdominal and back pain with hematuria CT with a 4 mm left distal ureteral calculus Since last visit has had minimal pain.  Intermittent voiding symptoms KUB 01/10/2022 showed stone still present   PMH: Past Medical History:  Diagnosis Date   Allergy    Clotting disorder (HCC)    GERD (gastroesophageal reflux disease)    History of heart attack 2012   Hyperlipidemia    Hypertension     Surgical History: Past Surgical History:  Procedure Laterality Date   CORONARY ANGIOPLASTY WITH STENT PLACEMENT  2012   REPLACEMENT TOTAL KNEE BILATERAL Bilateral    2017 left knee, 2019 right knee   WRIST SURGERY Left    Carpel Tunnel    Home Medications:  Allergies as of 01/20/2022       Reactions   Codeine Other (See Comments)   "Black outs"        Medication List        Accurate as of Jan 20, 2022  9:09 AM. If you have any questions, ask your nurse or doctor.          amLODipine 10 MG tablet Commonly known as: NORVASC Take 1 tablet (10 mg total) by mouth daily.   aspirin 81 MG EC tablet Take 1 tablet (81 mg total) by mouth daily. Swallow whole.   carvedilol 6.25 MG tablet Commonly known as: COREG Take 1 tablet (6.25 mg total) by mouth 2 (two) times daily.   ezetimibe 10 MG tablet Commonly known as: ZETIA Take 1 tablet (10 mg total) by mouth daily.   hydrochlorothiazide 25 MG tablet Commonly known as: HYDRODIURIL Take 1 tablet (25 mg total) by mouth daily.   hydrOXYzine 25 MG capsule Commonly known as: VISTARIL Take 1 capsule (25 mg total) by mouth at bedtime.   icosapent Ethyl 1 g capsule Commonly known as: VASCEPA Take 2 capsules (2 g total) by mouth 2  (two) times daily.   losartan 100 MG tablet Commonly known as: COZAAR Take 1 tablet (100 mg total) by mouth daily.   montelukast 10 MG tablet Commonly known as: SINGULAIR Take 10 mg by mouth daily.   nitroGLYCERIN 0.4 MG SL tablet Commonly known as: NITROSTAT Place 1 tablet (0.4 mg total) under the tongue every 5 (five) minutes as needed for chest pain. For a maximum of 3 doses in 24 hours.   pantoprazole 40 MG tablet Commonly known as: PROTONIX Take 1 tablet (40 mg total) by mouth in the morning and at bedtime.   ranolazine 1000 MG SR tablet Commonly known as: RANEXA Take 1 tablet (1,000 mg total) by mouth 2 (two) times daily.   rosuvastatin 40 MG tablet Commonly known as: CRESTOR Take 1 tablet (40 mg total) by mouth daily.   tadalafil 20 MG tablet Commonly known as: CIALIS Take 20 mg by mouth daily as needed for erectile dysfunction.        Allergies:  Allergies  Allergen Reactions   Codeine Other (See Comments)    "Black outs"    Family History: Family History  Problem Relation Age of Onset   Pancreatic cancer Mother    Heart attack Paternal Aunt     Social History:  reports that he has never smoked. He has never used smokeless tobacco. He reports current alcohol use. He reports that he does not use drugs.   Physical Exam: BP (!) 149/88   Pulse 63   Ht 5\' 7"  (1.702 m)   Wt 198 lb (89.8 kg)   BMI 31.01 kg/m   Constitutional:  Alert and oriented, No acute distress. HEENT: [place] AT, moist mucus membranes.  Trachea midline, no masses. Respiratory: Normal respiratory effort, no increased work of breathing. Neurologic: Grossly intact, no focal deficits, moving all 4 extremities. Psychiatric: Normal mood and affect.   Pertinent Imaging: KUB images were reviewed and personally interpreted  Assessment & Plan:    1.  Left distal ureteral calculus 4 mm left UVJ stone.   He desires to schedule SWL.  The procedure was discussed including the possibility of  persistent stone requiring repeat or alternative procedure All questions were answered.    Abbie Sons, [place] [address] [place], [place] [address] [phone]

## 2022-01-24 ENCOUNTER — Other Ambulatory Visit: Payer: Self-pay | Admitting: Urology

## 2022-01-24 DIAGNOSIS — N201 Calculus of ureter: Secondary | ICD-10-CM

## 2022-01-24 NOTE — Progress Notes (Signed)
ESWL ORDER FORM  Expected date of procedure: Patient preference  Surgeon:  TBD  Post op standing: 2-4wk follow up w/KUB prior  Anticoagulation/Aspirin/NSAID standing order: Hold all 72 hours prior  Anesthesia standing order: MAC  VTE standing: SCD's  Dx: Left Ureteral Stone  Procedure: left Extracorporeal shock wave lithotripsy  CPT : 10071  Standing Order Set:   *NPO after mn, KUB  *NS 162m/hr, Keflex 5096mPO, Benadryl 254mO, Valium 45m41m, Zofran 4mg 19m   Medications if other than standing orders:   NONE

## 2022-01-29 MED ORDER — SODIUM CHLORIDE 0.9 % IV SOLN: INTRAVENOUS | Status: DC

## 2022-01-29 MED ORDER — CEPHALEXIN 500 MG PO CAPS: 500.0000 mg | ORAL_CAPSULE | Freq: Once | ORAL | Status: AC

## 2022-01-29 MED ORDER — DIPHENHYDRAMINE HCL 25 MG PO CAPS: 25.0000 mg | ORAL_CAPSULE | ORAL | Status: AC

## 2022-01-29 MED ORDER — ONDANSETRON HCL 4 MG/2ML IJ SOLN: 4.0000 mg | Freq: Once | INTRAMUSCULAR | Status: AC

## 2022-01-29 MED ORDER — DIAZEPAM 5 MG PO TABS: 10.0000 mg | ORAL_TABLET | ORAL | Status: AC

## 2022-01-30 ENCOUNTER — Other Ambulatory Visit: Payer: Self-pay

## 2022-01-30 ENCOUNTER — Encounter
Admission: RE | Disposition: A | Discharge status: Home / Self Care | Payer: Self-pay | Source: Home / Self Care | Attending: Urology

## 2022-01-30 ENCOUNTER — Ambulatory Visit: Payer: BC Managed Care – PPO

## 2022-01-30 ENCOUNTER — Ambulatory Visit
Admission: RE | Admit: 2022-01-30 | Discharge: 2022-01-30 | Disposition: A | Discharge status: Home / Self Care | Payer: BC Managed Care – PPO | Attending: Urology | Admitting: Urology

## 2022-01-30 ENCOUNTER — Encounter: Payer: Self-pay | Admitting: Urology

## 2022-01-30 DIAGNOSIS — N201 Calculus of ureter: Secondary | ICD-10-CM | POA: Diagnosis present

## 2022-01-30 DIAGNOSIS — I252 Old myocardial infarction: Secondary | ICD-10-CM | POA: Diagnosis not present

## 2022-01-30 DIAGNOSIS — I10 Essential (primary) hypertension: Secondary | ICD-10-CM | POA: Diagnosis not present

## 2022-01-30 DIAGNOSIS — Z955 Presence of coronary angioplasty implant and graft: Secondary | ICD-10-CM | POA: Diagnosis not present

## 2022-01-30 HISTORY — PX: EXTRACORPOREAL SHOCK WAVE LITHOTRIPSY: SHX1557

## 2022-01-30 SURGERY — LITHOTRIPSY, ESWL
Anesthesia: Moderate Sedation | Laterality: Left

## 2022-01-30 MED ORDER — CEPHALEXIN 500 MG PO CAPS
ORAL_CAPSULE | ORAL | Status: AC
  Administered 2022-01-30: 500 mg via ORAL
  Filled 2022-01-30: qty 1

## 2022-01-30 MED ORDER — DIAZEPAM 5 MG PO TABS
ORAL_TABLET | ORAL | Status: AC
  Administered 2022-01-30: 10 mg via ORAL
  Filled 2022-01-30: qty 2

## 2022-01-30 MED ORDER — ONDANSETRON HCL 4 MG/2ML IJ SOLN
INTRAMUSCULAR | Status: AC
  Administered 2022-01-30: 4 mg via INTRAVENOUS
  Filled 2022-01-30: qty 2

## 2022-01-30 MED ORDER — OXYCODONE HCL 5 MG PO TABS: 5.0000 mg | ORAL_TABLET | ORAL | 0 refills | Status: DC | PRN

## 2022-01-30 MED ORDER — DIPHENHYDRAMINE HCL 25 MG PO CAPS
ORAL_CAPSULE | ORAL | Status: AC
Start: 2022-01-30 — End: 2022-01-30
  Administered 2022-01-30: 25 mg via ORAL
  Filled 2022-01-30: qty 1

## 2022-01-30 MED ORDER — TAMSULOSIN HCL 0.4 MG PO CAPS: 0.4000 mg | ORAL_CAPSULE | Freq: Every day | ORAL | 0 refills | Status: DC

## 2022-01-30 NOTE — Interval H&P Note (Signed)
History and Physical Interval Note:  01/30/2022 9:42 AM  Jaime [name]  has presented today for surgery, with the diagnosis of Left Ureteral Stone.  The various methods of treatment have been discussed with the patient and family. After consideration of risks, benefits and other options for treatment, the patient has consented to  Procedure(s): EXTRACORPOREAL SHOCK WAVE LITHOTRIPSY (ESWL) (Left) as a surgical intervention.  The patient's history has been reviewed, patient examined, no change in status, stable for surgery.  I have reviewed the patient's chart and labs.  Questions were answered to the patient's satisfaction.     [place]

## 2022-01-30 NOTE — Discharge Instructions (Addendum)
As per the [facility] discharge instructions A prescription for pain medication was sent to your pharmacy A prescription for tamsulosin which will help you pass stone fragments was also sent to your pharmacy Call [facility] at [phone] for pain not controlled with oral medications or fever greater than 101 degrees You have a follow-up appointment scheduled 02/13/2022  AMBULATORY SURGERY  DISCHARGE INSTRUCTIONS   The drugs that you were given will stay in your system until tomorrow so for the next 24 hours you should not:  Drive an automobile Make any legal decisions Drink any alcoholic beverage   You may resume regular meals tomorrow.  Today it is better to start with liquids and gradually work up to solid foods.  You may eat anything you prefer, but it is better to start with liquids, then soup and crackers, and gradually work up to solid foods.   Please notify your doctor immediately if you have any unusual bleeding, trouble breathing, redness and pain at the surgery site, drainage, fever, or pain not relieved by medication.    Additional Instructions:        Please contact your physician with any problems or Same Day Surgery at [phone], Monday through Friday 6 am to 4 pm, or [place] at [facility] number at [phone].

## 2022-01-31 ENCOUNTER — Encounter: Payer: Self-pay | Admitting: Urology

## 2022-02-06 ENCOUNTER — Other Ambulatory Visit: Payer: Self-pay

## 2022-02-06 MED ORDER — AMLODIPINE BESYLATE 10 MG PO TABS: 10.0000 mg | ORAL_TABLET | Freq: Every day | ORAL | 3 refills | Status: DC

## 2022-02-06 MED ORDER — CARVEDILOL 6.25 MG PO TABS: 6.2500 mg | ORAL_TABLET | Freq: Two times a day (BID) | ORAL | 3 refills | Status: DC

## 2022-02-06 NOTE — Telephone Encounter (Signed)
Refill sent for amlodipine 10 mg one tablet daily.

## 2022-02-13 ENCOUNTER — Ambulatory Visit
Admission: RE | Admit: 2022-02-13 | Discharge: 2022-02-13 | Disposition: A | Discharge status: Home / Self Care | Payer: BC Managed Care – PPO | Attending: Urology | Admitting: Urology

## 2022-02-13 ENCOUNTER — Ambulatory Visit
Admission: RE | Admit: 2022-02-13 | Discharge: 2022-02-13 | Disposition: A | Discharge status: Home / Self Care | Payer: BC Managed Care – PPO | Source: Ambulatory Visit | Attending: Urology | Admitting: Urology

## 2022-02-13 ENCOUNTER — Ambulatory Visit: Payer: BC Managed Care – PPO | Admitting: Urology

## 2022-02-13 VITALS — BP 142/82 | HR 64 | Ht 67.0 in | Wt 192.0 lb

## 2022-02-13 DIAGNOSIS — N201 Calculus of ureter: Secondary | ICD-10-CM | POA: Insufficient documentation

## 2022-02-13 MED ORDER — TAMSULOSIN HCL 0.4 MG PO CAPS: 0.4000 mg | ORAL_CAPSULE | Freq: Every day | ORAL | 1 refills | Status: DC

## 2022-02-13 NOTE — Progress Notes (Signed)
02/13/22 10:00 AM   Jaime [name] [birth year] [id number]  Referring provider:  Loura Pardon, MD [address] [place],  [place] [address]  Urological history  Left ureteral calculus  - CT on 12/18/2021 at ED visit visualized a 4 mm left distal ureteral calculus.  - KUB on 01/10/2022 showed stable stone burden  - S/p ESWL on 01/30/2022 with Dr Lonna Cobb    HPI: Jaime [name] is a 61 y.o.male who presents today for a 2 week post ESWL follow-up with KUB.   KUB today  was personally reviewed and interpreted and shows left UVJ stone is still present although reduced is size.    He passed fragments. He brings them in for analysis.  He reports that he is not taking flomax.   Patient denies any modifying or aggravating factors.  Patient denies any gross hematuria, dysuria or suprapubic/flank pain.  Patient denies any fevers, chills, nausea or vomiting.      PMH: Past Medical History:  Diagnosis Date   Allergy    Clotting disorder (HCC)    GERD (gastroesophageal reflux disease)    History of heart attack 2012   Hyperlipidemia    Hypertension     Surgical History: Past Surgical History:  Procedure Laterality Date   CORONARY ANGIOPLASTY WITH STENT PLACEMENT  2012   EXTRACORPOREAL SHOCK WAVE LITHOTRIPSY Left 01/30/2022   Procedure: EXTRACORPOREAL SHOCK WAVE LITHOTRIPSY (ESWL);  Surgeon: Riki Altes, MD;  Location: [facility] ORS;  Service: Urology;  Laterality: Left;   REPLACEMENT TOTAL KNEE BILATERAL Bilateral    2017 left knee, 2019 right knee   WRIST SURGERY Left    Carpel Tunnel    Home Medications:  Allergies as of 02/13/2022       Reactions   Codeine Other (See Comments)   "Black outs"        Medication List        Accurate as of February 13, 2022 10:00 AM. If you have any questions, ask your nurse or doctor.          amLODipine 10 MG tablet Commonly known as: NORVASC Take 1 tablet (10 mg total) by mouth daily.   aspirin EC 81 MG tablet Take 1 tablet (81 mg total) by  mouth daily. Swallow whole.   carvedilol 6.25 MG tablet Commonly known as: COREG Take 1 tablet (6.25 mg total) by mouth 2 (two) times daily.   ezetimibe 10 MG tablet Commonly known as: ZETIA Take 1 tablet (10 mg total) by mouth daily.   hydrochlorothiazide 25 MG tablet Commonly known as: HYDRODIURIL Take 1 tablet (25 mg total) by mouth daily.   hydrOXYzine 25 MG capsule Commonly known as: VISTARIL Take 1 capsule (25 mg total) by mouth at bedtime.   icosapent Ethyl 1 g capsule Commonly known as: VASCEPA Take 2 capsules (2 g total) by mouth 2 (two) times daily.   losartan 100 MG tablet Commonly known as: COZAAR Take 1 tablet (100 mg total) by mouth daily.   montelukast 10 MG tablet Commonly known as: SINGULAIR Take 10 mg by mouth daily.   nitroGLYCERIN 0.4 MG SL tablet Commonly known as: NITROSTAT Place 1 tablet (0.4 mg total) under the tongue every 5 (five) minutes as needed for chest pain. For a maximum of 3 doses in 24 hours.   oxyCODONE 5 MG immediate release tablet Commonly known as: Roxicodone Take 1 tablet (5 mg total) by mouth every 4 (four) hours as needed for severe pain.   pantoprazole 40 MG tablet Commonly known  as: PROTONIX Take 1 tablet (40 mg total) by mouth in the morning and at bedtime.   ranolazine 1000 MG SR tablet Commonly known as: RANEXA Take 1 tablet (1,000 mg total) by mouth 2 (two) times daily.   rosuvastatin 40 MG tablet Commonly known as: CRESTOR Take 1 tablet (40 mg total) by mouth daily.   tadalafil 20 MG tablet Commonly known as: CIALIS Take 20 mg by mouth daily as needed for erectile dysfunction.   tamsulosin 0.4 MG Caps capsule Commonly known as: FLOMAX Take 1 capsule (0.4 mg total) by mouth daily after breakfast.        Allergies:  Allergies  Allergen Reactions   Codeine Other (See Comments)    "Black outs"    Family History: Family History  Problem Relation Age of Onset   Pancreatic cancer Mother    Heart attack  Paternal Aunt     Social History:  reports that he has never smoked. He has never used smokeless tobacco. He reports current alcohol use. He reports that he does not use drugs.   Physical Exam: BP (!) 142/82   Pulse 64   Ht 5\' 7"  (1.702 m)   Wt 192 lb (87.1 kg)   BMI 30.07 kg/m   Constitutional:  Alert and oriented, No acute distress. HEENT: [employer] AT, moist mucus membranes.  Trachea midline Cardiovascular: No clubbing, cyanosis, or edema. Respiratory: Normal respiratory effort, no increased work of breathing. Skin: No rashes, bruises or suspicious lesions. Neurologic: Grossly intact, no focal deficits, moving all 4 extremities. Psychiatric: Normal mood and affect.  Laboratory Data: Lab Results  Component Value Date   CREATININE 1.39 (H) 12/18/2021    Urinalysis Component     Latest Ref Rng 12/18/2021  Glucose     70 - 99 mg/dL 02/17/2022 (H)   BUN     6 - 20 mg/dL 22 (H)   Creatinine     0.61 - 1.24 mg/dL 962 (H)   Sodium     8.36 - 145 mmol/L 137   Potassium     3.5 - 5.1 mmol/L 3.8   Chloride     98 - 111 mmol/L 105   CO2     22 - 32 mmol/L 23   Calcium     8.9 - 10.3 mg/dL 9.2   Total Protein     6.5 - 8.1 g/dL 7.5   Albumin     3.5 - 5.0 g/dL 4.3   Total Bilirubin     0.3 - 1.2 mg/dL 0.8   Alkaline Phosphatase     38 - 126 U/L 66   AST     15 - 41 U/L 24   ALT     0 - 44 U/L 31   GFR, Estimated     >60 mL/min 58 (L)   Anion gap     5 - 15  9   I have reviewed the labs.     Pertinent Imaging: KUB personally reviewed and interpreted see HPI.   Assessment & Plan:    Left UVJ stones  - He has passed some fragments - Stone appears to have reduced in size about 2-3 mm on KUB - He was not taking flomax. He will start tamsulosin  - Return in 2 weeks for KUB  Return in about 2 weeks (around 02/27/2022) for KUB.  [facility] [address], [address] [place], [place] [phone]  I, [phone], acting as a  Nemiah Commander for [facility]  Sherrel Ploch, PA-C.,have documented all relevant documentation on the behalf of Meshach Perry, PA-C,as directed by  [facility] Jessenia Filippone, PA-C while in the presence of Duy Lemming, PA-C.  I have reviewed the above documentation for accuracy and completeness, and I agree with the above.    Michiel CowboyShannon Grasiela Jonsson, PA-C

## 2022-02-13 NOTE — Addendum Note (Signed)
Addended by: Martha Clan on: 02/13/2022 10:22 AM   Modules accepted: Orders

## 2022-02-19 LAB — CALCULI, WITH PHOTOGRAPH (CLINICAL LAB)
Calcium Oxalate Dihydrate: 20 %
Calcium Oxalate Monohydrate: 80 %
Weight Calculi: 41 mg

## 2022-02-28 ENCOUNTER — Ambulatory Visit: Payer: BC Managed Care – PPO | Admitting: Urology

## 2022-02-28 ENCOUNTER — Other Ambulatory Visit
Admission: RE | Admit: 2022-02-28 | Discharge: 2022-02-28 | Disposition: A | Discharge status: Home / Self Care | Payer: BC Managed Care – PPO | Source: Home / Self Care | Attending: Urology | Admitting: Urology

## 2022-02-28 ENCOUNTER — Ambulatory Visit
Admission: RE | Admit: 2022-02-28 | Discharge: 2022-02-28 | Disposition: A | Discharge status: Home / Self Care | Payer: BC Managed Care – PPO | Source: Ambulatory Visit | Attending: Urology | Admitting: Urology

## 2022-02-28 VITALS — BP 128/81 | HR 64 | Ht 67.0 in | Wt 190.0 lb

## 2022-02-28 DIAGNOSIS — Z87442 Personal history of urinary calculi: Secondary | ICD-10-CM

## 2022-02-28 DIAGNOSIS — N201 Calculus of ureter: Secondary | ICD-10-CM | POA: Insufficient documentation

## 2022-02-28 DIAGNOSIS — N528 Other male erectile dysfunction: Secondary | ICD-10-CM

## 2022-02-28 MED ORDER — TADALAFIL 20 MG PO TABS: 20.0000 mg | ORAL_TABLET | Freq: Every day | ORAL | 3 refills | Status: DC | PRN

## 2022-03-26 ENCOUNTER — Telehealth: Payer: Self-pay | Admitting: Cardiology

## 2022-03-26 MED ORDER — PANTOPRAZOLE SODIUM 40 MG PO TBEC: 40.0000 mg | DELAYED_RELEASE_TABLET | Freq: Two times a day (BID) | ORAL | 0 refills | Status: DC

## 2022-03-26 MED ORDER — ROSUVASTATIN CALCIUM 40 MG PO TABS: 40.0000 mg | ORAL_TABLET | Freq: Every day | ORAL | 0 refills | Status: DC

## 2022-03-26 MED ORDER — EZETIMIBE 10 MG PO TABS: 10.0000 mg | ORAL_TABLET | Freq: Every day | ORAL | 0 refills | Status: DC

## 2022-03-26 NOTE — Telephone Encounter (Signed)
Requested Prescriptions   Signed Prescriptions Disp Refills   ezetimibe (ZETIA) 10 MG tablet 90 tablet 0    Sig: Take 1 tablet (10 mg total) by mouth daily.    Authorizing Provider: Debbe Odea    Ordering User: Iverson Alamin C   pantoprazole (PROTONIX) 40 MG tablet 180 tablet 0    Sig: Take 1 tablet (40 mg total) by mouth in the morning and at bedtime.    Authorizing Provider: Debbe Odea    Ordering User: Shawnie Dapper, Kiosha Buchan C   rosuvastatin (CRESTOR) 40 MG tablet 90 tablet 0    Sig: Take 1 tablet (40 mg total) by mouth daily.    Authorizing Provider: Debbe Odea    Ordering User: Kendrick Fries

## 2022-03-26 NOTE — Telephone Encounter (Signed)
*  STAT* If patient is at the pharmacy, call can be transferred to refill team.   1. Which medications need to be refilled? (please list name of each medication and dose if known)  ezetimibe (ZETIA) 10 MG tablet pantoprazole (PROTONIX) 40 MG tablet rosuvastatin (CRESTOR) 40 MG tablet rosuvastatin (CRESTOR) 40 MG tablet  2. Which pharmacy/location (including street and city if local pharmacy) is medication to be sent to? [facility] [address] - [place], [place] - [address]  3. Do they need a 30 day or 90 day supply? 90 with refills  Patient is out of medication

## 2022-04-01 ENCOUNTER — Ambulatory Visit: Payer: BC Managed Care – PPO | Admitting: Urology

## 2022-04-07 ENCOUNTER — Other Ambulatory Visit: Payer: Self-pay

## 2022-04-07 MED ORDER — RANOLAZINE ER 1000 MG PO TB12: 1000.0000 mg | ORAL_TABLET | Freq: Two times a day (BID) | ORAL | 0 refills | Status: DC

## 2022-04-08 NOTE — Progress Notes (Unsigned)
04/09/22 1:39 PM   [name] 10-08-1960 [id number]  Referring provider:  Loura Pardon, MD [address] [place],  [place] [address]  Urological history  Nephrolithiasis -stone analysis calcium oxalate - CT on 12/18/2021 at ED visit visualized a 4 mm left distal ureteral calculus.  - KUB on 01/10/2022 showed stable stone burden  - S/p ESWL on 01/30/2022 with Dr Lonna Cobb   2. ED -contributing factors of age, BPH, HTN, CAD, HLD and alcohol consumption -SHIM 16 -tadalafil 20, on-demand-dosing   HPI: Jaime [name] is a 61 y.o.male who presents today for a prostate cancer screening.  He is having issues with postvoid dribbling.  Patient denies any modifying or aggravating factors.  Patient denies any gross hematuria, dysuria or suprapubic/flank pain.  Patient denies any fevers, chills, nausea or vomiting.  He did notice that when he was on the Flomax to help pass his kidney stone, the postvoid dribbling did abate.    IPSS     Row Name 04/09/22 1300         International Prostate Symptom Score   How often have you had the sensation of not emptying your bladder? Less than half the time     How often have you had to urinate less than every two hours? About half the time     How often have you found you stopped and started again several times when you urinated? Less than half the time     How often have you found it difficult to postpone urination? About half the time     How often have you had a weak urinary stream? About half the time     How often have you had to strain to start urination? Less than half the time     How many times did you typically get up at night to urinate? 1 Time     Total IPSS Score 16       Quality of Life due to urinary symptoms   If you were to spend the rest of your life with your urinary condition just the way it is now how would you feel about that? Mixed              Score:  1-7 Mild 8-19 Moderate 20-35 Severe   Patient is not having  spontaneous erections.  He denies any pain or curvature with erections.  He is having good results with tadalafil 20 mg on-demand dosing.  He does state that he has reduced libido in comparison to his wife.   SHIM     Row Name 04/09/22 1315         SHIM: Over the last 6 months:   How do you rate your confidence that you could get and keep an erection? Moderate     When you had erections with sexual stimulation, how often were your erections hard enough for penetration (entering your partner)? Sometimes (about half the time)     During sexual intercourse, how often were you able to maintain your erection after you had penetrated (entered) your partner? Sometimes (about half the time)     During sexual intercourse, how difficult was it to maintain your erection to completion of intercourse? Slightly Difficult     When you attempted sexual intercourse, how often was it satisfactory for you? Sometimes (about half the time)       SHIM Total Score   SHIM 16              Score:  1-7 Severe ED 8-11 Moderate ED 12-16 Mild-Moderate ED 17-21 Mild ED 22-25 No ED   PMH: Past Medical History:  Diagnosis Date   Allergy    Clotting disorder ([place])    GERD (gastroesophageal reflux disease)    History of heart attack 2012   Hyperlipidemia    Hypertension     Surgical History: Past Surgical History:  Procedure Laterality Date   CORONARY ANGIOPLASTY WITH STENT PLACEMENT  2012   EXTRACORPOREAL SHOCK WAVE LITHOTRIPSY Left 01/30/2022   Procedure: EXTRACORPOREAL SHOCK WAVE LITHOTRIPSY (ESWL);  Surgeon: Abbie Sons, MD;  Location: [facility] ORS;  Service: Urology;  Laterality: Left;   REPLACEMENT TOTAL KNEE BILATERAL Bilateral    2017 left knee, 2019 right knee   WRIST SURGERY Left    Carpel Tunnel    Home Medications:  Allergies as of 04/09/2022       Reactions   Codeine Other (See Comments)   "Black outs"        Medication List        Accurate as of April 09, 2022  1:39 PM.  If you have any questions, ask your nurse or doctor.          amLODipine 10 MG tablet Commonly known as: NORVASC Take 1 tablet (10 mg total) by mouth daily.   aspirin EC 81 MG tablet Take 1 tablet (81 mg total) by mouth daily. Swallow whole.   carvedilol 6.25 MG tablet Commonly known as: COREG Take 1 tablet (6.25 mg total) by mouth 2 (two) times daily.   ezetimibe 10 MG tablet Commonly known as: ZETIA Take 1 tablet (10 mg total) by mouth daily.   hydrochlorothiazide 25 MG tablet Commonly known as: HYDRODIURIL Take 1 tablet (25 mg total) by mouth daily.   hydrOXYzine 25 MG capsule Commonly known as: VISTARIL Take 1 capsule (25 mg total) by mouth at bedtime.   icosapent Ethyl 1 g capsule Commonly known as: VASCEPA Take 2 capsules (2 g total) by mouth 2 (two) times daily.   losartan 100 MG tablet Commonly known as: COZAAR Take 1 tablet (100 mg total) by mouth daily.   montelukast 10 MG tablet Commonly known as: SINGULAIR Take 10 mg by mouth daily.   nitroGLYCERIN 0.4 MG SL tablet Commonly known as: NITROSTAT Place 1 tablet (0.4 mg total) under the tongue every 5 (five) minutes as needed for chest pain. For a maximum of 3 doses in 24 hours.   pantoprazole 40 MG tablet Commonly known as: PROTONIX Take 1 tablet (40 mg total) by mouth in the morning and at bedtime.   ranolazine 1000 MG SR tablet Commonly known as: RANEXA Take 1 tablet (1,000 mg total) by mouth 2 (two) times daily.   rosuvastatin 40 MG tablet Commonly known as: CRESTOR Take 1 tablet (40 mg total) by mouth daily.   tadalafil 20 MG tablet Commonly known as: CIALIS Take 20 mg by mouth daily as needed for erectile dysfunction.   tadalafil 20 MG tablet Commonly known as: CIALIS Take 1 tablet (20 mg total) by mouth daily as needed for erectile dysfunction.   tamsulosin 0.4 MG Caps capsule Commonly known as: FLOMAX Take 1 capsule (0.4 mg total) by mouth daily after breakfast.         Allergies:  Allergies  Allergen Reactions   Codeine Other (See Comments)    "Black outs"    Family History: Family History  Problem Relation Age of Onset   Pancreatic cancer Mother    Heart attack Paternal  Aunt     Social History:  reports that he has never smoked. He has never used smokeless tobacco. He reports current alcohol use. He reports that he does not use drugs.   Physical Exam: BP 128/78   Pulse (!) 58   Ht 5\' 7"  (1.702 m)   Wt 198 lb (89.8 kg)   BMI 31.01 kg/m   Constitutional:  Well nourished. Alert and oriented, No acute distress. HEENT: [place] AT, moist mucus membranes.  Trachea midline Cardiovascular: No clubbing, cyanosis, or edema. Respiratory: Normal respiratory effort, no increased work of breathing. GU: No CVA tenderness.  No bladder fullness or masses.  Patient with circumcised phallus.  Urethral meatus is patent.  No penile discharge. No penile lesions or rashes. Scrotum without lesions, cysts, rashes and/or edema.  Testicles are located scrotally bilaterally. No masses are appreciated in the testicles. Left and right epididymis are normal. Rectal: Patient with  normal sphincter tone. Anus and perineum without scarring or rashes. No rectal masses are appreciated. Prostate is approximately 45 grams, I could only palpate the apex and midportion of the gland, no nodules are appreciated. Seminal vesicles could not be palpated Neurologic: Grossly intact, no focal deficits, moving all 4 extremities. Psychiatric: Normal mood and affect.   Laboratory Data: N/A  Pertinent Imaging: N/A  Assessment & Plan:    1. BPH with LUTS -PSA pending -DRE benign -UA benign, 12/2021 -most bothersome symptoms are postvoid dribbling -continue conservative management, avoiding bladder irritants and timed voiding's -Continue tamsulosin 0.4 mg daily-script sent to pharmacy  2. ED - at goal with tadalafil 20 mg, on-demand-dosing - testosterone pending   3.  Nephrolithiasis -No issues with renal colic at this visit  Return for Patient relocating to 01/2022.  [place], PA-C   [facility] [address], [address] [place], [place] [id number]

## 2022-04-09 ENCOUNTER — Encounter: Payer: Self-pay | Admitting: Urology

## 2022-04-09 ENCOUNTER — Ambulatory Visit: Payer: BC Managed Care – PPO | Admitting: Urology

## 2022-04-09 VITALS — BP 128/78 | HR 58 | Ht 67.0 in | Wt 198.0 lb

## 2022-04-09 DIAGNOSIS — N200 Calculus of kidney: Secondary | ICD-10-CM

## 2022-04-09 DIAGNOSIS — N138 Other obstructive and reflux uropathy: Secondary | ICD-10-CM | POA: Diagnosis not present

## 2022-04-09 DIAGNOSIS — N529 Male erectile dysfunction, unspecified: Secondary | ICD-10-CM

## 2022-04-09 DIAGNOSIS — Z87442 Personal history of urinary calculi: Secondary | ICD-10-CM | POA: Diagnosis not present

## 2022-04-09 DIAGNOSIS — N401 Enlarged prostate with lower urinary tract symptoms: Secondary | ICD-10-CM

## 2022-04-09 MED ORDER — TAMSULOSIN HCL 0.4 MG PO CAPS: 0.4000 mg | ORAL_CAPSULE | Freq: Every day | ORAL | 3 refills | Status: DC

## 2022-04-10 ENCOUNTER — Telehealth: Payer: Self-pay

## 2022-04-10 LAB — PSA: Prostate Specific Ag, Serum: 2.9 ng/mL (ref 0.0–4.0)

## 2022-04-10 LAB — TESTOSTERONE: Testosterone: 183 ng/dL — ABNORMAL LOW (ref 264–916)

## 2022-04-10 NOTE — Telephone Encounter (Signed)
Pt notified via mychart, see separate encounter. 

## 2022-04-10 NOTE — Telephone Encounter (Signed)
-----   Message from Harle Battiest, PA-C sent at 04/10/2022  8:39 AM EDT ----- Please let Mr. Hippler know that his testosterone level was below normal and we need to draw a morning testosterone before 10 am for conformation.

## 2022-04-11 ENCOUNTER — Other Ambulatory Visit: Payer: Self-pay

## 2022-04-11 DIAGNOSIS — I10 Essential (primary) hypertension: Secondary | ICD-10-CM

## 2022-04-14 ENCOUNTER — Other Ambulatory Visit
Admission: RE | Admit: 2022-04-14 | Discharge: 2022-04-14 | Disposition: A | Discharge status: Home / Self Care | Payer: BC Managed Care – PPO | Attending: Cardiology | Admitting: Cardiology

## 2022-04-14 ENCOUNTER — Ambulatory Visit: Payer: BC Managed Care – PPO | Admitting: Cardiology

## 2022-04-14 ENCOUNTER — Encounter: Payer: Self-pay | Admitting: Cardiology

## 2022-04-14 VITALS — BP 142/79 | HR 44 | Ht 67.0 in | Wt 193.0 lb

## 2022-04-14 DIAGNOSIS — I10 Essential (primary) hypertension: Secondary | ICD-10-CM | POA: Diagnosis present

## 2022-04-14 DIAGNOSIS — I2510 Atherosclerotic heart disease of native coronary artery without angina pectoris: Secondary | ICD-10-CM

## 2022-04-14 DIAGNOSIS — E7800 Pure hypercholesterolemia, unspecified: Secondary | ICD-10-CM | POA: Diagnosis not present

## 2022-04-14 LAB — BASIC METABOLIC PANEL
Anion gap: 8 (ref 5–15)
BUN: 18 mg/dL (ref 6–20)
CO2: 28 mmol/L (ref 22–32)
Calcium: 9.4 mg/dL (ref 8.9–10.3)
Chloride: 107 mmol/L (ref 98–111)
Creatinine, Ser: 1.09 mg/dL (ref 0.61–1.24)
GFR, Estimated: >60 mL/min (ref >60)
Glucose, Bld: 91 mg/dL (ref 70–99)
Potassium: 4.3 mmol/L (ref 3.5–5.1)
Sodium: 143 mmol/L (ref 135–145)

## 2022-04-14 NOTE — Patient Instructions (Signed)
Medication Instructions:  Your physician recommends that you continue on your current medications as directed. Please refer to the Current Medication list given to you today.  *If you need a refill on your cardiac medications before your next appointment, please call your pharmacy*   Lab Work: None ordered If you have labs (blood work) drawn today and your tests are completely normal, you will receive your results only by: MyChart Message (if you have MyChart) OR A paper copy in the mail If you have any lab test that is abnormal or we need to change your treatment, we will call you to review the results.   Testing/Procedures: None ordered   Follow-Up: At [facility], you and your health needs are our priority.  As part of our continuing mission to provide you with exceptional heart care, we have created designated Provider Care Teams.  These Care Teams include your primary Cardiologist (physician) and Advanced Practice Providers (APPs -  Physician Assistants and Nurse Practitioners) who all work together to provide you with the care you need, when you need it.  We recommend signing up for the patient portal called "MyChart".  Sign up information is provided on this After Visit Summary.  MyChart is used to connect with patients for Virtual Visits (Telemedicine).  Patients are able to view lab/test results, encounter notes, upcoming appointments, etc.  Non-urgent messages can be sent to your provider as well.   To learn more about what you can do with MyChart, go to [email or web address].    Your next appointment:   Your physician wants you to follow-up in: 6 months You will receive a reminder letter in the mail two months in advance. If you don't receive a letter, please call our office to schedule the follow-up appointment.   The format for your next appointment:   In Person  Provider:   You may see Debbe Odea, MD or one of the following Advanced Practice Providers on your  designated Care Team:   Nicolasa Ducking, NP Eula Listen, PA-C Cadence Fransico Michael, [place]   Other Instructions N/A  Important Information About Sugar

## 2022-04-14 NOTE — Progress Notes (Signed)
Cardiology Office Note:    Date:  04/14/2022   ID:  Jaime [name], DOB 1960/10/30, MRN [id number]  PCP:  Loura Pardon, MD   [facility] Providers Cardiologist:  Debbe Odea, MD     Referring MD: Loura Pardon, MD   Chief Complaint  Patient presents with   Follow-up    3 month F/U-No new cardiac conerns    History of Present Illness:    Jaime [name] is a 61 y.o. male with a hx of CAD s/p PCI to LAD in 2012, hypertension, hyperlipidemia, DVT who presents for follow-up.  Previously seen due to hypertension  HCTZ was started, patient states feeling well, denies chest pain or shortness of breath.  Blood pressures have been controlled at home.  Taking all medications as prescribed, tolerating all medications without any adverse effect.  Has no concerns at this time.   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  He was treated with Coumadin for a year.  Diagnosed with COVID-19 04/27/2020, developed right lower extremity DVT.  Was started on Eliquis.  He was told he would likely be on Eliquis long-term.  He took Eliquis for about 30 days and then stop.  Did not refill.  Being seen by hematology.  Past Medical History:  Diagnosis Date   Allergy    Clotting disorder (HCC)    GERD (gastroesophageal reflux disease)    History of heart attack 2012   Hyperlipidemia    Hypertension     Past Surgical History:  Procedure Laterality Date   CORONARY ANGIOPLASTY WITH STENT PLACEMENT  2012   EXTRACORPOREAL SHOCK WAVE LITHOTRIPSY Left 01/30/2022   Procedure: EXTRACORPOREAL SHOCK WAVE LITHOTRIPSY (ESWL);  Surgeon: Riki Altes, MD;  Location: [facility] ORS;  Service: Urology;  Laterality: Left;   REPLACEMENT TOTAL KNEE BILATERAL Bilateral    2017 left knee, 2019 right knee   WRIST SURGERY Left    Carpel Tunnel    Current Medications: Current Meds  Medication Sig   amLODipine (NORVASC) 10 MG tablet Take 1 tablet (10 mg total) by mouth daily.    aspirin 81 MG EC tablet Take 1 tablet (81 mg total) by mouth daily. Swallow whole.   carvedilol (COREG) 6.25 MG tablet Take 1 tablet (6.25 mg total) by mouth 2 (two) times daily.   ezetimibe (ZETIA) 10 MG tablet Take 1 tablet (10 mg total) by mouth daily.   hydrochlorothiazide (HYDRODIURIL) 25 MG tablet Take 1 tablet (25 mg total) by mouth daily.   hydrOXYzine (VISTARIL) 25 MG capsule Take 1 capsule (25 mg total) by mouth at bedtime.   icosapent Ethyl (VASCEPA) 1 g capsule Take 2 capsules (2 g total) by mouth 2 (two) times daily.   losartan (COZAAR) 100 MG tablet Take 1 tablet (100 mg total) by mouth daily.   nitroGLYCERIN (NITROSTAT) 0.4 MG SL tablet Place 1 tablet (0.4 mg total) under the tongue every 5 (five) minutes as needed for chest pain. For a maximum of 3 doses in 24 hours.   pantoprazole (PROTONIX) 40 MG tablet Take 1 tablet (40 mg total) by mouth in the morning and at bedtime.   rosuvastatin (CRESTOR) 40 MG tablet Take 1 tablet (40 mg total) by mouth daily.   tadalafil (CIALIS) 20 MG tablet Take 1 tablet (20 mg total) by mouth daily as needed for erectile dysfunction.   tamsulosin (FLOMAX) 0.4 MG CAPS capsule Take 1 capsule (0.4 mg total) by mouth daily after breakfast.  Allergies:   Codeine   Social History   Socioeconomic History   Marital status: Married    Spouse name: Gladais   Number of children: Not on file   Years of education: Not on file   Highest education level: Not on file  Occupational History   Not on file  Tobacco Use   Smoking status: Never   Smokeless tobacco: Never  Vaping Use   Vaping Use: Never used  Substance and Sexual Activity   Alcohol use: Yes    Comment: RARE- 1 to 2 a month   Drug use: Never   Sexual activity: Yes    Birth control/protection: None  Other Topics Concern   Not on file  Social History Narrative   Not on file   Social Determinants of Health   Financial Resource Strain: Not on file  Food Insecurity: Not on file   Transportation Needs: Not on file  Physical Activity: Not on file  Stress: Not on file  Social Connections: Not on file     Family History: The patient's family history includes Heart attack in his paternal aunt; Pancreatic cancer in his mother.  ROS:   Please see the history of present illness.     All other systems reviewed and are negative.  EKGs/Labs/Other Studies Reviewed:    The following studies were reviewed today:   EKG:  EKG is  ordered today.  The ekg ordered today demonstrates normal sinus rhythm, normal ECG  Recent Labs: 12/18/2021: ALT 31; Hemoglobin 15.6; Platelets 192 04/14/2022: BUN 18; Creatinine, Ser 1.09; Potassium 4.3; Sodium 143  Recent Lipid Panel    Component Value Date/Time   CHOL 112 07/22/2021 0740   TRIG 61 07/22/2021 0740   HDL 39 (L) 07/22/2021 0740   CHOLHDL 2.9 07/22/2021 0740   LDLCALC 60 07/22/2021 0740     Risk Assessment/Calculations:          Physical Exam:    VS:  BP (!) 142/79 (BP Location: Left Arm, Patient Position: Sitting, Cuff Size: Large) Comment: repeat after 5 min  Pulse (!) 44 Comment: 49 on pulse ox  Ht 5\' 7"  (1.702 m)   Wt 193 lb (87.5 kg)   SpO2 97%   BMI 30.23 kg/m     Wt Readings from Last 3 Encounters:  04/14/22 193 lb (87.5 kg)  04/09/22 198 lb (89.8 kg)  02/28/22 190 lb (86.2 kg)     GEN:  Well nourished, well developed in mild to moderate distress HEENT: Normal NECK: No JVD; No carotid bruits CARDIAC: Regular, bradycardic. RESPIRATORY:  Clear to auscultation without rales, wheezing or rhonchi  ABDOMEN: Soft, non-tender, non-distended MUSCULOSKELETAL:  No edema; No deformity  SKIN: Warm and dry NEUROLOGIC:  Alert and oriented x 3 PSYCHIATRIC:  Normal affect   ASSESSMENT:    1. Coronary artery disease involving native heart without angina pectoris, unspecified vessel or lesion type   2. Primary hypertension   3. Pure hypercholesterolemia    PLAN:    In order of problems listed above:  CAD  s/p PCI to LAD 2012.  Denies chest pain or shortness of breath.  Echo 09/2021 EF 55 to 60%..  Continue aspirin, Crestor,Coreg, Ranexa. Hypertension, BP elevated today, usually controlled.  Continue Coreg 6.25 mg twice daily, Norvasc, losartan, hydrochlorothiazide 25 mg daily.   Hyperlipidemia, LDL at goal.  Continue Zetia, Crestor, Vascepa.  Follow-up in 6-12 months.      Medication Adjustments/Labs and Tests Ordered: Current medicines are reviewed at length with the  patient today.  Concerns regarding medicines are outlined above.  No orders of the defined types were placed in this encounter.   No orders of the defined types were placed in this encounter.    Patient Instructions  Medication Instructions:  Your physician recommends that you continue on your current medications as directed. Please refer to the Current Medication list given to you today.  *If you need a refill on your cardiac medications before your next appointment, please call your pharmacy*   Lab Work: None ordered If you have labs (blood work) drawn today and your tests are completely normal, you will receive your results only by: MyChart Message (if you have MyChart) OR A paper copy in the mail If you have any lab test that is abnormal or we need to change your treatment, we will call you to review the results.   Testing/Procedures: None ordered   Follow-Up: At [facility], you and your health needs are our priority.  As part of our continuing mission to provide you with exceptional heart care, we have created designated Provider Care Teams.  These Care Teams include your primary Cardiologist (physician) and Advanced Practice Providers (APPs -  Physician Assistants and Nurse Practitioners) who all work together to provide you with the care you need, when you need it.  We recommend signing up for the patient portal called "MyChart".  Sign up information is provided on this After Visit Summary.  MyChart is  used to connect with patients for Virtual Visits (Telemedicine).  Patients are able to view lab/test results, encounter notes, upcoming appointments, etc.  Non-urgent messages can be sent to your provider as well.   To learn more about what you can do with MyChart, go to [email or web address].    Your next appointment:   Your physician wants you to follow-up in: 6 months You will receive a reminder letter in the mail two months in advance. If you don't receive a letter, please call our office to schedule the follow-up appointment.   The format for your next appointment:   In Person  Provider:   You may see Debbe Odea, MD or one of the following Advanced Practice Providers on your designated Care Team:   Nicolasa Ducking, NP Eula Listen, PA-C Cadence Fransico Michael, [place]   Other Instructions N/A  Important Information About Sugar          Signed, Debbe Odea, MD  04/14/2022 9:41 AM    [facility]

## 2022-06-19 ENCOUNTER — Other Ambulatory Visit: Payer: Self-pay | Admitting: *Deleted

## 2022-06-19 MED ORDER — PANTOPRAZOLE SODIUM 40 MG PO TBEC: 40.0000 mg | DELAYED_RELEASE_TABLET | Freq: Two times a day (BID) | ORAL | 0 refills | Status: DC

## 2022-07-03 DIAGNOSIS — N529 Male erectile dysfunction, unspecified: Secondary | ICD-10-CM | POA: Insufficient documentation

## 2022-07-03 DIAGNOSIS — N138 Other obstructive and reflux uropathy: Secondary | ICD-10-CM | POA: Insufficient documentation

## 2022-07-03 DIAGNOSIS — K219 Gastro-esophageal reflux disease without esophagitis: Secondary | ICD-10-CM | POA: Insufficient documentation

## 2022-07-03 DIAGNOSIS — Z87442 Personal history of urinary calculi: Secondary | ICD-10-CM | POA: Insufficient documentation

## 2022-07-03 NOTE — Patient Instructions (Signed)

## 2022-07-04 ENCOUNTER — Encounter: Payer: Self-pay | Admitting: Nurse Practitioner

## 2022-07-04 ENCOUNTER — Ambulatory Visit: Payer: BC Managed Care – PPO | Admitting: Nurse Practitioner

## 2022-07-04 VITALS — BP 136/78 | HR 65 | Temp 98.1°F | Ht 66.25 in | Wt 198.0 lb

## 2022-07-04 DIAGNOSIS — L219 Seborrheic dermatitis, unspecified: Secondary | ICD-10-CM | POA: Insufficient documentation

## 2022-07-04 DIAGNOSIS — I10 Essential (primary) hypertension: Secondary | ICD-10-CM | POA: Diagnosis not present

## 2022-07-04 DIAGNOSIS — E669 Obesity, unspecified: Secondary | ICD-10-CM | POA: Insufficient documentation

## 2022-07-04 DIAGNOSIS — E782 Mixed hyperlipidemia: Secondary | ICD-10-CM

## 2022-07-04 DIAGNOSIS — Z1159 Encounter for screening for other viral diseases: Secondary | ICD-10-CM

## 2022-07-04 DIAGNOSIS — I25112 Atherosclerotic heart disease of native coronary artery with refractory angina pectoris: Secondary | ICD-10-CM | POA: Diagnosis not present

## 2022-07-04 DIAGNOSIS — N138 Other obstructive and reflux uropathy: Secondary | ICD-10-CM

## 2022-07-04 DIAGNOSIS — K219 Gastro-esophageal reflux disease without esophagitis: Secondary | ICD-10-CM

## 2022-07-04 DIAGNOSIS — E6609 Other obesity due to excess calories: Secondary | ICD-10-CM

## 2022-07-04 DIAGNOSIS — L989 Disorder of the skin and subcutaneous tissue, unspecified: Secondary | ICD-10-CM | POA: Insufficient documentation

## 2022-07-04 DIAGNOSIS — Z114 Encounter for screening for human immunodeficiency virus [HIV]: Secondary | ICD-10-CM

## 2022-07-04 DIAGNOSIS — Z87442 Personal history of urinary calculi: Secondary | ICD-10-CM

## 2022-07-04 DIAGNOSIS — Z6831 Body mass index (BMI) 31.0-31.9, adult: Secondary | ICD-10-CM

## 2022-07-04 DIAGNOSIS — Z86718 Personal history of other venous thrombosis and embolism: Secondary | ICD-10-CM

## 2022-07-04 MED ORDER — ROSUVASTATIN CALCIUM 40 MG PO TABS: 40.0000 mg | ORAL_TABLET | Freq: Every day | ORAL | 4 refills | Status: DC

## 2022-07-04 MED ORDER — MONTELUKAST SODIUM 10 MG PO TABS: 10.0000 mg | ORAL_TABLET | Freq: Every day | ORAL | 4 refills | Status: DC

## 2022-07-04 MED ORDER — DESOXIMETASONE 0.25 % EX LIQD: CUTANEOUS | 0 refills | Status: DC

## 2022-07-04 MED ORDER — EZETIMIBE 10 MG PO TABS: 10.0000 mg | ORAL_TABLET | Freq: Every day | ORAL | 4 refills | Status: DC

## 2022-07-04 MED ORDER — KETOCONAZOLE 2 % EX SHAM: 1.0000 | MEDICATED_SHAMPOO | CUTANEOUS | 3 refills | Status: DC

## 2022-07-04 NOTE — Progress Notes (Signed)
BP 136/78 (BP Location: Left Arm, Patient Position: Sitting)   Pulse 65   Temp 98.1 F (36.7 C) (Oral)   Ht 5' 6.25" (1.683 m)   Wt 198 lb (89.8 kg)   SpO2 94%   BMI 31.72 kg/m    Subjective:    Patient ID: Jaime [name], male    DOB: 05/10/1961, 61 y.o.   MRN: [id number]  HPI: Jaime [name] is a 61 y.o. male  Chief Complaint  Patient presents with   Establish Care    Patient says he is here to establish care with new PCP.    Ear Problem    Patient says he has a spot on his R ear that he was supposed to have surgically removed, but he has since moved and do not have a Dermatologist office down here since he has moved.    Hair/Scalp Problem    Patient says he will have episodes of severe itching in his scalp. Patient says he will experience it all day. Patient says it happens all day and he tries to blank it out. Patient says the medication Dr.Vigg prescribed previously did not help at all.    Medication Refill    Patient is requesting a refill on Montelukast.    HYPERTENSION / HYPERLIPIDEMIA Currently Amlodipine, Carvedilol, Losartan, ASA, Vascepa, Crestor, Ranexa.  Follows with cardiology last visit 04/14/22.   Satisfied with current treatment? yes Duration of hypertension: chronic BP monitoring frequency: rarely BP range:  BP medication side effects: no Duration of hyperlipidemia: chronic Cholesterol medication side effects: no Cholesterol supplements: none Medication compliance: good compliance Aspirin: yes Recent stressors: no Recurrent headaches: no Visual changes: no Palpitations: no Dyspnea:  occasional with stairs or activity Chest pain: no Lower extremity edema: no Dizzy/lightheaded: no   GERD Continues on Protonix, has been on 2012 -- has tried to come off and this caused worsening.   GERD control status: stable Satisfied with current treatment? yes Heartburn frequency:  Medication side effects: no  Medication compliance: stable Dysphagia:  no Odynophagia:  no Hematemesis: no Blood in stool: no EGD: yes   SKIN LESION Has spot to right ear that was supposed to have removed prior to moving here, moved here from [place].  It used to be smaller, but has grown some.  Also has itchy scalp that presented since he moved to [place], takes Singulair for allergies -- but has not had since he moved here. Duration: months -- over a year Location: right ear Painful: yes Itching: no Onset: gradual Context: bigger Associated signs and symptoms:  History of skin cancer: yes -- has had multiple areas removed, basal cell History of precancerous skin lesions: no Family history of skin cancer: yes -- father   Relevant past medical, surgical, family and social history reviewed and updated as indicated. Interim medical history since our last visit reviewed. Allergies and medications reviewed and updated.  Review of Systems  Constitutional:  Negative for activity change, diaphoresis, fatigue and fever.  Respiratory:  Negative for cough, chest tightness, shortness of breath and wheezing.   Cardiovascular:  Negative for chest pain, palpitations and leg swelling.  Gastrointestinal: Negative.   Skin:        Skin lesion and itchy scalp  Neurological: Negative.   Psychiatric/Behavioral: Negative.      Per HPI unless specifically indicated above     Objective:    BP 136/78 (BP Location: Left Arm, Patient Position: Sitting)   Pulse 65   Temp 98.1 F (36.7 C) (  Oral)   Ht 5' 6.25" (1.683 m)   Wt 198 lb (89.8 kg)   SpO2 94%   BMI 31.72 kg/m   Wt Readings from Last 3 Encounters:  07/04/22 198 lb (89.8 kg)  04/14/22 193 lb (87.5 kg)  04/09/22 198 lb (89.8 kg)    Physical Exam Vitals and nursing note reviewed.  Constitutional:      General: He is awake.     Appearance: He is well-developed and well-groomed. He is obese. He is not ill-appearing or toxic-appearing.  HENT:     Head: Normocephalic and atraumatic. No abrasion.      Right Ear: Hearing normal. No drainage.     Left Ear: Hearing normal. No drainage.  Eyes:     General: Lids are normal.        Right eye: No discharge.        Left eye: No discharge.     Conjunctiva/sclera: Conjunctivae normal.     Pupils: Pupils are equal, round, and reactive to light.  Neck:     Thyroid: No thyromegaly.     Vascular: No carotid bruit.  Cardiovascular:     Rate and Rhythm: Normal rate and regular rhythm.     Heart sounds: Normal heart sounds, S1 normal and S2 normal. No murmur heard.    No gallop.  Pulmonary:     Effort: Pulmonary effort is normal. No accessory muscle usage or respiratory distress.     Breath sounds: Normal breath sounds.  Abdominal:     General: Bowel sounds are normal.     Palpations: Abdomen is soft. There is no hepatomegaly or splenomegaly.  Musculoskeletal:        General: Normal range of motion.     Cervical back: Normal range of motion and neck supple.     Right lower leg: No edema.     Left lower leg: No edema.  Lymphadenopathy:     Cervical: No cervical adenopathy.  Skin:    General: Skin is warm and dry.     Capillary Refill: Capillary refill takes less than 2 seconds.     Findings: Lesion present.          Comments: Scalp with scattered small scaly patches, no drainage or erythema.  White skin flakes noted to base around scaly patches.  Neurological:     Mental Status: He is alert and oriented to person, place, and time.     Deep Tendon Reflexes: Reflexes are normal and symmetric.     Reflex Scores:      Brachioradialis reflexes are 2+ on the right side and 2+ on the left side.      Patellar reflexes are 2+ on the right side and 2+ on the left side. Psychiatric:        Attention and Perception: Attention normal.        Mood and Affect: Mood normal.        Speech: Speech normal.        Behavior: Behavior normal. Behavior is cooperative.        Thought Content: Thought content normal.     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  Continue current medication regimen and collaboration with cardiology.  Recent note reviewed.  Obtain labs today.      Relevant Medications   ranolazine (RANEXA) 1000 MG SR tablet   ezetimibe (ZETIA) 10 MG tablet   rosuvastatin (CRESTOR) 40 MG tablet   Other Relevant Orders   CBC with Differential/Platelet   Primary hypertension    Chronic, ongoing with initial BP slightly above goal and repeat downward to goal range.  Recommend she monitor BP at least a few mornings a week at home and document.  DASH diet at home.  Continue current medication regimen and adjust as needed.  Labs today: CBC, CMP, Lipid, TSH.  Refills sent.  Continue collaboration with cardiology.  Return in 6 months.       Relevant Medications   ranolazine (RANEXA) 1000 MG SR tablet   ezetimibe (ZETIA) 10 MG tablet   rosuvastatin (CRESTOR) 40 MG tablet   Other Relevant Orders   CBC with Differential/Platelet   Comprehensive metabolic panel   TSH     Digestive   GERD without esophagitis    Chronic, ongoing.  Well-controlled with PPI, has been on for years.  Tried reductions without success.  Risks of PPI use were discussed with patient including bone loss, C. Diff diarrhea, pneumonia, infections, CKD, electrolyte abnormalities. Verbalizes understanding and chooses to  continue the medication.  Mag level annually.       Relevant Orders   Magnesium     Musculoskeletal and Integument   Seborrheic dermatitis    Ongoing since moving to [place] one year ago, educated him on diagnosis.  Will send in Ketoconazole 2% shampoo and Topicort 0.25% spray to use.  Instructions on scripts for use.  If any worsening or ongoing symptoms alert provider.      Skin lesion    Suspect a basal cell, referral to dermatology placed due to location of area and history of multiple basal cell removed in past.  Would benefit annual exams.      Relevant Orders   Ambulatory referral to Dermatology     Other   Hyperlipidemia    Chronic, ongoing.  Continue current medication regimen and adjust as needed.  Lipid panel today.      Relevant Medications   ranolazine (RANEXA) 1000 MG SR tablet   ezetimibe (ZETIA) 10 MG tablet   rosuvastatin (CRESTOR) 40 MG tablet   Other Relevant Orders   Comprehensive metabolic panel   Lipid Panel w/o Chol/HDL Ratio   Obesity    BMI 31.72.  Recommended eating smaller high protein, low fat meals more frequently and exercising 30 mins a day 5 times a week with a goal of 10-15lb weight loss in the next 3 months. Patient voiced their understanding and motivation to adhere to these recommendations.       Other Visit Diagnoses     Encounter for screening for HIV       HIV screen on labs today per guidelines for one time screening, discussed  with patient.   Relevant Orders   HIV Antibody (routine testing w rflx)   Need for hepatitis C screening test       Hep C screen on labs today per guidelines for one time screening, discussed with patient.   Relevant Orders   Hepatitis C antibody        Follow up plan: Return in about 6 months (around 01/03/2023) for HTN/HLD, GERD, H/O DVT.

## 2022-07-04 NOTE — Assessment & Plan Note (Signed)
Chronic, stable with no recent CP reported.  Continue current medication regimen and collaboration with cardiology.  Recent note reviewed.  Obtain labs today.

## 2022-07-04 NOTE — Assessment & Plan Note (Signed)
Chronic, ongoing.  Continue current medication regimen and adjust as needed. Lipid panel today. 

## 2022-07-04 NOTE — Assessment & Plan Note (Signed)
Suspect a basal cell, referral to dermatology placed due to location of area and history of multiple basal cell removed in past.  Would benefit annual exams.

## 2022-07-04 NOTE — Assessment & Plan Note (Signed)
Ongoing since moving to [place] one year ago, educated him on diagnosis.  Will send in Ketoconazole 2% shampoo and Topicort 0.25% spray to use.  Instructions on scripts for use.  If any worsening or ongoing symptoms alert provider.

## 2022-07-04 NOTE — Assessment & Plan Note (Signed)
BMI 31.72.  Recommended eating smaller high protein, low fat meals more frequently and exercising 30 mins a day 5 times a week with a goal of 10-15lb weight loss in the next 3 months. Patient voiced their understanding and motivation to adhere to these recommendations.  

## 2022-07-04 NOTE — Assessment & Plan Note (Signed)
Chronic, ongoing.  Well-controlled with PPI, has been on for years.  Tried reductions without success.  Risks of PPI use were discussed with patient including bone loss, C. Diff diarrhea, pneumonia, infections, CKD, electrolyte abnormalities. Verbalizes understanding and chooses to continue the medication.  Mag level annually.  

## 2022-07-04 NOTE — Assessment & Plan Note (Signed)
Chronic, ongoing with initial BP slightly above goal and repeat downward to goal range.  Recommend she monitor BP at least a few mornings a week at home and document.  DASH diet at home.  Continue current medication regimen and adjust as needed.  Labs today: CBC, CMP, Lipid, TSH.  Refills sent.  Continue collaboration with cardiology.  Return in 6 months.

## 2022-07-05 LAB — COMPREHENSIVE METABOLIC PANEL
ALT: 24 IU/L (ref 0–44)
AST: 23 IU/L (ref 0–40)
Albumin/Globulin Ratio: 1.8 (ref 1.2–2.2)
Albumin: 4.4 g/dL (ref 3.8–4.9)
Alkaline Phosphatase: 99 IU/L (ref 44–121)
BUN/Creatinine Ratio: 12 (ref 9–20)
BUN: 15 mg/dL (ref 6–24)
Bilirubin Total: 0.3 mg/dL (ref 0.0–1.2)
CO2: 23 mmol/L (ref 20–29)
Calcium: 9.9 mg/dL (ref 8.7–10.2)
Chloride: 99 mmol/L (ref 96–106)
Creatinine, Ser: 1.23 mg/dL (ref 0.76–1.27)
Globulin, Total: 2.5 g/dL (ref 1.5–4.5)
Glucose: 125 mg/dL — ABNORMAL HIGH (ref 70–99)
Potassium: 3.6 mmol/L (ref 3.5–5.2)
Sodium: 140 mmol/L (ref 134–144)
Total Protein: 6.9 g/dL (ref 6.0–8.5)
eGFR: 68 mL/min/{1.73_m2} (ref >59)

## 2022-07-05 LAB — CBC WITH DIFFERENTIAL/PLATELET
Basophils Absolute: 0 10*3/uL (ref 0.0–0.2)
Basos: 1 %
EOS (ABSOLUTE): 0.2 10*3/uL (ref 0.0–0.4)
Eos: 3 %
Hematocrit: 47.6 % (ref 37.5–51.0)
Hemoglobin: 16 g/dL (ref 13.0–17.7)
Immature Grans (Abs): 0 10*3/uL (ref 0.0–0.1)
Immature Granulocytes: 0 %
Lymphocytes Absolute: 1.8 10*3/uL (ref 0.7–3.1)
Lymphs: 26 %
MCH: 30.7 pg (ref 26.6–33.0)
MCHC: 33.6 g/dL (ref 31.5–35.7)
MCV: 91 fL (ref 79–97)
Monocytes Absolute: 0.9 10*3/uL (ref 0.1–0.9)
Monocytes: 12 %
Neutrophils Absolute: 4.2 10*3/uL (ref 1.4–7.0)
Neutrophils: 58 %
Platelets: 222 10*3/uL (ref 150–450)
RBC: 5.22 x10E6/uL (ref 4.14–5.80)
RDW: 12.6 % (ref 11.6–15.4)
WBC: 7.2 10*3/uL (ref 3.4–10.8)

## 2022-07-05 LAB — MAGNESIUM: Magnesium: 2.1 mg/dL (ref 1.6–2.3)

## 2022-07-05 LAB — LIPID PANEL W/O CHOL/HDL RATIO
Cholesterol, Total: 177 mg/dL (ref 100–199)
HDL: 35 mg/dL — ABNORMAL LOW (ref >39)
LDL Chol Calc (NIH): 112 mg/dL — ABNORMAL HIGH (ref 0–99)
Triglycerides: 167 mg/dL — ABNORMAL HIGH (ref 0–149)
VLDL Cholesterol Cal: 30 mg/dL (ref 5–40)

## 2022-07-05 LAB — TSH: TSH: 0.367 u[IU]/mL — ABNORMAL LOW (ref 0.450–4.500)

## 2022-07-05 LAB — HEPATITIS C ANTIBODY: Hep C Virus Ab: NONREACTIVE

## 2022-07-05 LAB — HIV ANTIBODY (ROUTINE TESTING W REFLEX): HIV Screen 4th Generation wRfx: NONREACTIVE

## 2022-07-06 ENCOUNTER — Other Ambulatory Visit: Payer: Self-pay | Admitting: Nurse Practitioner

## 2022-07-06 DIAGNOSIS — R7989 Other specified abnormal findings of blood chemistry: Secondary | ICD-10-CM

## 2022-07-06 DIAGNOSIS — E782 Mixed hyperlipidemia: Secondary | ICD-10-CM

## 2022-07-06 DIAGNOSIS — R7309 Other abnormal glucose: Secondary | ICD-10-CM

## 2022-07-06 NOTE — Progress Notes (Signed)
Contacted via [place] -- needs lab visit in 4 weeks. Good evening Jaime [name], your labs have returned: - Kidney function, creatinine and eGFR, remains normal, as is liver function, AST and ALT. Glucose, sugar, as little elevated -- on outpatient labs in 4 weeks will check A1c to ensure no prediabetes/diabetes. - Thyroid, TSH, is on lower side -- meaning more hyperactive/overactive.  We do not need to treat at this time.  But I will have staff call to recheck this in 4 weeks on outpatient labs to see if remains low. - Cholesterol levels had increased from last month, when you come for thyroid recheck I would like to recheck cholesterol with you fasting please. Remainder of labs are all stable.  Any questions? Keep being amazing!!  Thank you for allowing me to participate in your care.  I appreciate you. Kindest regards, Merci Walthers

## 2022-08-04 ENCOUNTER — Other Ambulatory Visit: Payer: BC Managed Care – PPO

## 2022-08-04 ENCOUNTER — Other Ambulatory Visit: Payer: Self-pay

## 2022-08-04 DIAGNOSIS — R7989 Other specified abnormal findings of blood chemistry: Secondary | ICD-10-CM

## 2022-08-04 DIAGNOSIS — R7309 Other abnormal glucose: Secondary | ICD-10-CM

## 2022-08-04 DIAGNOSIS — E782 Mixed hyperlipidemia: Secondary | ICD-10-CM

## 2022-08-04 MED ORDER — ICOSAPENT ETHYL 1 G PO CAPS: 2.0000 g | ORAL_CAPSULE | Freq: Two times a day (BID) | ORAL | 2 refills | Status: DC

## 2022-08-04 MED ORDER — RANOLAZINE ER 1000 MG PO TB12: 1000.0000 mg | ORAL_TABLET | Freq: Two times a day (BID) | ORAL | 0 refills | Status: DC

## 2022-08-05 ENCOUNTER — Encounter: Payer: Self-pay | Admitting: Nurse Practitioner

## 2022-08-05 DIAGNOSIS — R7309 Other abnormal glucose: Secondary | ICD-10-CM | POA: Insufficient documentation

## 2022-08-05 LAB — TSH: TSH: 0.622 u[IU]/mL (ref 0.450–4.500)

## 2022-08-05 LAB — LIPID PANEL W/O CHOL/HDL RATIO
Cholesterol, Total: 124 mg/dL (ref 100–199)
HDL: 35 mg/dL — ABNORMAL LOW (ref >39)
LDL Chol Calc (NIH): 76 mg/dL (ref 0–99)
Triglycerides: 61 mg/dL (ref 0–149)
VLDL Cholesterol Cal: 13 mg/dL (ref 5–40)

## 2022-08-05 LAB — HEMOGLOBIN A1C
Est. average glucose Bld gHb Est-mCnc: 131 mg/dL
Hgb A1c MFr Bld: 6.2 % — ABNORMAL HIGH (ref 4.8–5.6)

## 2022-08-05 LAB — T4, FREE: Free T4: 1.57 ng/dL (ref 0.82–1.77)

## 2022-08-05 LAB — THYROID PEROXIDASE ANTIBODY: Thyroperoxidase Ab SerPl-aCnc: 15 IU/mL (ref 0–34)

## 2022-08-05 NOTE — Progress Notes (Signed)
Contacted via MyChart   Good afternoon Jaime [name], your labs have returned: - Cholesterol labs improved this check with LDL 76.  Continue current medications. - Thyroid labs normal, no changes needed. - The A1c diabetes testing, this looks at your blood sugars over the past 3 months and turns the average into a number.  Your number is 6.2%, meaning you are prediabetic.  Any number 5.7 to 6.4 is considered prediabetes and any number 6.5 or greater is considered diabetes.   I would recommend heavy focus on decreasing foods high in sugar and your intake of things like bread products, pasta, and rice.  The American Diabetes Association online has a large amount of information on diet changes to make.  We will recheck this number in 3 months to ensure you are not continuing to trend upwards and move into diabetes.  Any questions? Keep being amazing!!  Thank you for allowing me to participate in your care.  I appreciate you. Kindest regards, Bastian Andreoli

## 2022-09-02 ENCOUNTER — Other Ambulatory Visit: Payer: Self-pay

## 2022-09-02 MED ORDER — HYDROCHLOROTHIAZIDE 25 MG PO TABS: 25.0000 mg | ORAL_TABLET | Freq: Every day | ORAL | 3 refills | Status: DC

## 2022-10-06 ENCOUNTER — Other Ambulatory Visit: Payer: Self-pay

## 2022-10-06 ENCOUNTER — Other Ambulatory Visit: Payer: Self-pay | Admitting: Nurse Practitioner

## 2022-10-06 MED ORDER — PANTOPRAZOLE SODIUM 40 MG PO TBEC: 40.0000 mg | DELAYED_RELEASE_TABLET | Freq: Two times a day (BID) | ORAL | 1 refills | Status: DC

## 2022-10-06 NOTE — Telephone Encounter (Signed)
Medication refill for Pantroprazole 40mg  BID last ov 07/04/22, upcoming ov no up coming appt . Please advise

## 2022-10-07 NOTE — Telephone Encounter (Signed)
Requested medication (s) are due for refill today:   Not sure  Requested medication (s) are on the active medication list:   Yes  Future visit scheduled:   Yes   Last ordered: 10/06/2022 #180, 1 refill by another provider.   Jolene Cannady discontinued this on 09/04/2022.  Returned because it was prescribed by another provider.     Requested Prescriptions  Pending Prescriptions Disp Refills   pantoprazole (PROTONIX) 40 MG tablet [Pharmacy Med Name: PANTOPRAZOLE DR 40 MG TAB[*]] 180 tablet 0    Sig: TAKE ONE TABLET BY MOUTH EVERY MORNING AND AT BEDTIME     There is no refill protocol information for this order

## 2022-10-07 NOTE — Telephone Encounter (Signed)
Pt scheduled 4/29

## 2022-10-22 ENCOUNTER — Ambulatory Visit: Payer: BC Managed Care – PPO | Attending: Cardiology | Admitting: Cardiology

## 2022-10-22 ENCOUNTER — Encounter: Payer: Self-pay | Admitting: Cardiology

## 2022-10-22 VITALS — BP 148/80 | HR 48 | Ht 66.25 in | Wt 200.0 lb

## 2022-10-22 DIAGNOSIS — I10 Essential (primary) hypertension: Secondary | ICD-10-CM | POA: Diagnosis not present

## 2022-10-22 DIAGNOSIS — E7800 Pure hypercholesterolemia, unspecified: Secondary | ICD-10-CM | POA: Diagnosis not present

## 2022-10-22 DIAGNOSIS — I2510 Atherosclerotic heart disease of native coronary artery without angina pectoris: Secondary | ICD-10-CM | POA: Diagnosis not present

## 2022-10-22 MED ORDER — HYDRALAZINE HCL 50 MG PO TABS: 50.0000 mg | ORAL_TABLET | Freq: Two times a day (BID) | ORAL | 3 refills | Status: DC

## 2022-10-22 NOTE — Progress Notes (Signed)
Cardiology Office Note:    Date:  10/22/2022   ID:  [name], DOB November 09, 1960, MRN TO:[id number]  PCP:  Venita Lick, NP   [facility] Providers Cardiologist:  Kate Sable, MD     Referring MD: Venita Lick, NP   No chief complaint on file.   History of Present Illness:    Jaime [name] is a 62 y.o. male with a hx of CAD s/p PCI to LAD in 2012, hypertension, hyperlipidemia, DVT who presents for follow-up.    Being seen for hypertension, currently not increased due to bradycardia, heart rate is 48 today.  Previously 59.  States feeling well, denies chest pain or shortness of breath.  Takes and tolerates all medications as prescribed.  Occasionally has fatigue/out of breath.  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  He was treated with Coumadin for a year.  Diagnosed with COVID-19 04/27/2020, developed right lower extremity DVT.  Was started on Eliquis.  He was told he would likely be on Eliquis long-term.  He took Eliquis for about 30 days and then stop.  Did not refill.  Being seen by hematology.  Past Medical History:  Diagnosis Date   Allergy    Clotting disorder ([place])    GERD (gastroesophageal reflux disease)    History of heart attack 2012   Hyperlipidemia    Hypertension    Kidney stones     Past Surgical History:  Procedure Laterality Date   CORONARY ANGIOPLASTY WITH STENT PLACEMENT  2012   EXTRACORPOREAL SHOCK WAVE LITHOTRIPSY Left 01/30/2022   Procedure: EXTRACORPOREAL SHOCK WAVE LITHOTRIPSY (ESWL);  Surgeon: Abbie Sons, MD;  Location: [facility] ORS;  Service: Urology;  Laterality: Left;   REPLACEMENT TOTAL KNEE BILATERAL Bilateral    2017 left knee, 2019 right knee   WRIST SURGERY Left    Carpel Tunnel    Current Medications: Current Meds  Medication Sig   amLODipine (NORVASC) 10 MG tablet Take 1 tablet (10 mg total) by mouth daily.   aspirin 81 MG EC tablet Take 1 tablet  (81 mg total) by mouth daily. Swallow whole.   Desoximetasone 0.25 % LIQD Apply to scalp daily for 2 weeks and then only use as needed for itchy scalp (no more then twice weekly).   ezetimibe (ZETIA) 10 MG tablet Take 1 tablet (10 mg total) by mouth daily.   hydrALAZINE (APRESOLINE) 50 MG tablet Take 1 tablet (50 mg total) by mouth in the morning and at bedtime.   hydrochlorothiazide (HYDRODIURIL) 25 MG tablet Take 1 tablet (25 mg total) by mouth daily.   icosapent Ethyl (VASCEPA) 1 g capsule Take 2 capsules (2 g total) by mouth 2 (two) times daily.   ketoconazole (NIZORAL) 2 % shampoo Apply 1 Application topically 2 (two) times a week.   losartan (COZAAR) 100 MG tablet Take 1 tablet (100 mg total) by mouth daily.   montelukast (SINGULAIR) 10 MG tablet Take 1 tablet (10 mg total) by mouth daily.   nitroGLYCERIN (NITROSTAT) 0.4 MG SL tablet Place 1 tablet (0.4 mg total) under the tongue every 5 (five) minutes as needed for chest pain. For a maximum of 3 doses in 24 hours.   pantoprazole (PROTONIX) 40 MG tablet Take 1 tablet (40 mg total) by mouth in the morning and at bedtime. Need appt for further refills   ranolazine (RANEXA) 1000 MG SR tablet Take 1 tablet (1,000 mg total) by mouth  2 (two) times daily.   rosuvastatin (CRESTOR) 40 MG tablet Take 1 tablet (40 mg total) by mouth daily.   tadalafil (CIALIS) 20 MG tablet Take 1 tablet (20 mg total) by mouth daily as needed for erectile dysfunction.   [DISCONTINUED] carvedilol (COREG) 6.25 MG tablet Take 1 tablet (6.25 mg total) by mouth 2 (two) times daily.     Allergies:   Codeine   Social History   Socioeconomic History   Marital status: Married    Spouse name: Gladais   Number of children: Not on file   Years of education: Not on file   Highest education level: Not on file  Occupational History   Not on file  Tobacco Use   Smoking status: Never   Smokeless tobacco: Never  Vaping Use   Vaping Use: Never used  Substance and Sexual  Activity   Alcohol use: Yes    Comment: RARE- 1 to 2 a month   Drug use: Never   Sexual activity: Yes    Birth control/protection: None  Other Topics Concern   Not on file  Social History Narrative   Not on file   Social Determinants of Health   Financial Resource Strain: Not on file  Food Insecurity: Not on file  Transportation Needs: Not on file  Physical Activity: Not on file  Stress: Not on file  Social Connections: Not on file     Family History: The patient's family history includes Cancer in his father; Heart attack in his paternal aunt; Pancreatic cancer in his mother.  ROS:   Please see the history of present illness.     All other systems reviewed and are negative.  EKGs/Labs/Other Studies Reviewed:    The following studies were reviewed today:   EKG:  EKG is  ordered today.  The ekg ordered today demonstrates sinus bradycardia, otherwise normal ECG.  Recent Labs: 07/04/2022: ALT 24; BUN 15; Creatinine, Ser 1.23; Hemoglobin 16.0; Magnesium 2.1; Platelets 222; Potassium 3.6; Sodium 140 08/04/2022: TSH 0.622  Recent Lipid Panel    Component Value Date/Time   CHOL 124 08/04/2022 0850   TRIG 61 08/04/2022 0850   HDL 35 (L) 08/04/2022 0850   CHOLHDL 2.9 07/22/2021 0740   LDLCALC 76 08/04/2022 0850     Risk Assessment/Calculations:        Physical Exam:    VS:  BP (!) 148/80   Pulse (!) 48   Ht 5' 6.25" (1.683 m)   Wt 200 lb (90.7 kg)   BMI 32.04 kg/m     Wt Readings from Last 3 Encounters:  10/22/22 200 lb (90.7 kg)  07/04/22 198 lb (89.8 kg)  04/14/22 193 lb (87.5 kg)     GEN:  Well nourished, well developed in mild to moderate distress HEENT: Normal NECK: No JVD; No carotid bruits CARDIAC: Regular, bradycardic. RESPIRATORY:  Clear to auscultation without rales, wheezing or rhonchi  ABDOMEN: Soft, non-tender, non-distended MUSCULOSKELETAL:  No edema; No deformity  SKIN: Warm and dry NEUROLOGIC:  Alert and oriented x 3 PSYCHIATRIC:   Normal affect   ASSESSMENT:    1. Coronary artery disease involving native heart without angina pectoris, unspecified vessel or lesion type   2. Primary hypertension   3. Pure hypercholesterolemia    PLAN:    In order of problems listed above:  CAD s/p PCI to LAD 2012.  Denies chest pain or shortness of breath.  Echo 09/2021 EF 55 to 60%..  Continue aspirin, Crestor, Ranexa. Hypertension, BP elevated today,  bradycardic, heart rate 48.  Start hydralazine 50 mg twice daily, stop Coreg.  Continue Norvasc, losartan, hydrochlorothiazide 25 mg daily.   Hyperlipidemia, LDL at goal.  Continue Zetia, Crestor, Vascepa.  Follow-up in 3 months.      Medication Adjustments/Labs and Tests Ordered: Current medicines are reviewed at length with the patient today.  Concerns regarding medicines are outlined above.  Orders Placed This Encounter  Procedures   EKG 12-Lead    Meds ordered this encounter  Medications   hydrALAZINE (APRESOLINE) 50 MG tablet    Sig: Take 1 tablet (50 mg total) by mouth in the morning and at bedtime.    Dispense:  180 tablet    Refill:  3     Patient Instructions  Medication Instructions:   STOP Carvedilol START Hydralazine - take one tablet ( 50 mg) by mouth twice a day.   *If you need a refill on your cardiac medications before your next appointment, please call your pharmacy*   Lab Work:  None Ordered  If you have labs (blood work) drawn today and your tests are completely normal, you will receive your results only by: [place] (if you have MyChart) OR A paper copy in the mail If you have any lab test that is abnormal or we need to change your treatment, we will call you to review the results.   Testing/Procedures:  None Ordered   Follow-Up: At [facility], you and your health needs are our priority.  As part of our continuing mission to provide you with exceptional heart care, we have created designated Provider Care Teams.   These Care Teams include your primary Cardiologist (physician) and Advanced Practice Providers (APPs -  Physician Assistants and Nurse Practitioners) who all work together to provide you with the care you need, when you need it.  We recommend signing up for the patient portal called "MyChart".  Sign up information is provided on this After Visit Summary.  MyChart is used to connect with patients for Virtual Visits (Telemedicine).  Patients are able to view lab/test results, encounter notes, upcoming appointments, etc.  Non-urgent messages can be sent to your provider as well.   To learn more about what you can do with MyChart, go to [email or web address].    Your next appointment:   3 month(s)  Provider:   You may see Kate Sable, MD or one of the following Advanced Practice Providers on your designated Care Team:   Murray Hodgkins, NP Christell Faith, PA-C Cadence Kathlen Mody, PA-C Gerrie Nordmann, NP   Signed, Kate Sable, MD  10/22/2022 3:09 PM    [place]

## 2022-10-22 NOTE — Patient Instructions (Signed)
Medication Instructions:   STOP Carvedilol START Hydralazine - take one tablet ( 50 mg) by mouth twice a day.   *If you need a refill on your cardiac medications before your next appointment, please call your pharmacy*   Lab Work:  None Ordered  If you have labs (blood work) drawn today and your tests are completely normal, you will receive your results only by: [place] (if you have MyChart) OR A paper copy in the mail If you have any lab test that is abnormal or we need to change your treatment, we will call you to review the results.   Testing/Procedures:  None Ordered   Follow-Up: At [facility], you and your health needs are our priority.  As part of our continuing mission to provide you with exceptional heart care, we have created designated Provider Care Teams.  These Care Teams include your primary Cardiologist (physician) and Advanced Practice Providers (APPs -  Physician Assistants and Nurse Practitioners) who all work together to provide you with the care you need, when you need it.  We recommend signing up for the patient portal called "MyChart".  Sign up information is provided on this After Visit Summary.  MyChart is used to connect with patients for Virtual Visits (Telemedicine).  Patients are able to view lab/test results, encounter notes, upcoming appointments, etc.  Non-urgent messages can be sent to your provider as well.   To learn more about what you can do with MyChart, go to [email or web address].    Your next appointment:   3 month(s)  Provider:   You may see Kate Sable, MD or one of the following Advanced Practice Providers on your designated Care Team:   Murray Hodgkins, NP Christell Faith, PA-C Cadence Kathlen Mody, PA-C Gerrie Nordmann, NP

## 2022-11-10 ENCOUNTER — Other Ambulatory Visit: Payer: Self-pay

## 2022-11-10 MED ORDER — ICOSAPENT ETHYL 1 G PO CAPS: 2.0000 g | ORAL_CAPSULE | Freq: Two times a day (BID) | ORAL | 1 refills | Status: DC

## 2022-11-10 MED ORDER — RANOLAZINE ER 1000 MG PO TB12: 1000.0000 mg | ORAL_TABLET | Freq: Two times a day (BID) | ORAL | 0 refills | Status: DC

## 2022-11-13 ENCOUNTER — Emergency Department
Admission: EM | Admit: 2022-11-13 | Discharge: 2022-11-13 | Disposition: A | Discharge status: Home / Self Care | Payer: BC Managed Care – PPO | Attending: Emergency Medicine | Admitting: Emergency Medicine

## 2022-11-13 ENCOUNTER — Emergency Department: Payer: BC Managed Care – PPO

## 2022-11-13 ENCOUNTER — Other Ambulatory Visit: Payer: Self-pay

## 2022-11-13 DIAGNOSIS — I2510 Atherosclerotic heart disease of native coronary artery without angina pectoris: Secondary | ICD-10-CM | POA: Insufficient documentation

## 2022-11-13 DIAGNOSIS — I10 Essential (primary) hypertension: Secondary | ICD-10-CM | POA: Diagnosis not present

## 2022-11-13 DIAGNOSIS — R1013 Epigastric pain: Secondary | ICD-10-CM | POA: Diagnosis not present

## 2022-11-13 DIAGNOSIS — R0789 Other chest pain: Secondary | ICD-10-CM | POA: Diagnosis not present

## 2022-11-13 DIAGNOSIS — R079 Chest pain, unspecified: Secondary | ICD-10-CM | POA: Diagnosis present

## 2022-11-13 LAB — CBC
HCT: 45.6 % (ref 39.0–52.0)
Hemoglobin: 15.4 g/dL (ref 13.0–17.0)
MCH: 30.6 pg (ref 26.0–34.0)
MCHC: 33.8 g/dL (ref 30.0–36.0)
MCV: 90.5 fL (ref 80.0–100.0)
Platelets: 194 10*3/uL (ref 150–400)
RBC: 5.04 MIL/uL (ref 4.22–5.81)
RDW: 12.4 % (ref 11.5–15.5)
WBC: 7.1 10*3/uL (ref 4.0–10.5)
nRBC: 0 % (ref 0.0–0.2)

## 2022-11-13 LAB — LIPASE, BLOOD: Lipase: 35 U/L (ref 11–51)

## 2022-11-13 LAB — BASIC METABOLIC PANEL
Anion gap: 11 (ref 5–15)
BUN: 18 mg/dL (ref 6–20)
CO2: 26 mmol/L (ref 22–32)
Calcium: 8.7 mg/dL — ABNORMAL LOW (ref 8.9–10.3)
Chloride: 99 mmol/L (ref 98–111)
Creatinine, Ser: 1.08 mg/dL (ref 0.61–1.24)
GFR, Estimated: >60 mL/min (ref >60)
Glucose, Bld: 165 mg/dL — ABNORMAL HIGH (ref 70–99)
Potassium: 3.3 mmol/L — ABNORMAL LOW (ref 3.5–5.1)
Sodium: 136 mmol/L (ref 135–145)

## 2022-11-13 LAB — HEPATIC FUNCTION PANEL
ALT: 30 U/L (ref 0–44)
AST: 25 U/L (ref 15–41)
Albumin: 3.8 g/dL (ref 3.5–5.0)
Alkaline Phosphatase: 60 U/L (ref 38–126)
Bilirubin, Direct: <0.1 mg/dL (ref 0.0–0.2)
Total Bilirubin: 0.8 mg/dL (ref 0.3–1.2)
Total Protein: 6.8 g/dL (ref 6.5–8.1)

## 2022-11-13 LAB — TROPONIN I (HIGH SENSITIVITY)
Troponin I (High Sensitivity): <2 ng/L (ref <18)
Troponin I (High Sensitivity): 3 ng/L (ref <18)

## 2022-11-13 MED ORDER — IOHEXOL 350 MG/ML SOLN
100.0000 mL | Freq: Once | INTRAVENOUS | Status: AC | PRN
  Administered 2022-11-13: 100 mL via INTRAVENOUS

## 2022-11-13 MED ORDER — KETOROLAC TROMETHAMINE 15 MG/ML IJ SOLN
15.0000 mg | Freq: Once | INTRAMUSCULAR | Status: AC
  Administered 2022-11-13: 15 mg via INTRAVENOUS
  Filled 2022-11-13: qty 1

## 2022-11-13 MED ORDER — PANTOPRAZOLE SODIUM 40 MG IV SOLR: 40.0000 mg | Freq: Once | INTRAVENOUS | Status: DC

## 2022-11-13 MED ORDER — PANTOPRAZOLE SODIUM 40 MG IV SOLR
40.0000 mg | Freq: Once | INTRAVENOUS | Status: DC
  Filled 2022-11-13: qty 10

## 2022-11-13 MED ORDER — BUTALBITAL-APAP-CAFFEINE 50-325-40 MG PO TABS
1.0000 | ORAL_TABLET | Freq: Once | ORAL | Status: AC
  Administered 2022-11-13: 1 via ORAL
  Filled 2022-11-13: qty 1

## 2022-11-13 MED ORDER — NITROGLYCERIN 0.4 MG SL SUBL
0.4000 mg | SUBLINGUAL_TABLET | SUBLINGUAL | Status: DC | PRN
  Administered 2022-11-13 (×3): 0.4 mg via SUBLINGUAL
  Filled 2022-11-13: qty 1

## 2022-11-13 NOTE — Discharge Instructions (Signed)
Your lab tests and CT scan today were all okay.  Continue taking all your medications and follow a bland diet for a few days to see if this helps resolve your symptoms.

## 2022-11-13 NOTE — ED Provider Notes (Signed)
[facility] Provider Note    Event Date/Time   First MD Initiated Contact with Patient 11/13/22 2154     (approximate)   History   Chief Complaint: Chest Pain   HPI  Jaime [name] is a 62 y.o. male with a history of CAD, DVT, hypertension, GERD who comes ED complaining of central lower chest pain described as dull ache, started this morning with getting out of bed, waxing and waning throughout the day.  No aggravating or alleviating factors.  Not exertional, not pleuritic.  No shortness of breath diaphoresis or vomiting, nonradiating.     Physical Exam   Triage Vital Signs: ED Triage Vitals  Enc Vitals Group     BP 11/13/22 1941 (!) 165/148     Pulse Rate 11/13/22 1941 83     Resp 11/13/22 1941 20     Temp 11/13/22 1941 98.2 F (36.8 C)     Temp Source 11/13/22 1941 Oral     SpO2 11/13/22 1941 98 %     Weight 11/13/22 1940 200 lb (90.7 kg)     Height 11/13/22 1940 '5\' 7"'$  (1.702 m)     Head Circumference --      Peak Flow --      Pain Score 11/13/22 1940 8     Pain Loc --      Pain Edu? --      Excl. in [place]? --     Most recent vital signs: Vitals:   11/13/22 2051 11/13/22 2200  BP: 138/80 119/78  Pulse: 82 69  Resp: (!) 21 18  Temp:    SpO2: 100% 97%    General: Awake, no distress.  CV:  Good peripheral perfusion.  Regular rate and rhythm, normal distal pulses Resp:  Normal effort.  Clear to auscultation bilaterally Abd:  No distention.  Soft with epigastric tenderness reproducing his pain Other:  There is tenderness over the inferior sternum which reproduces the patient's pain.   ED Results / Procedures / Treatments   Labs (all labs ordered are listed, but only abnormal results are displayed) Labs Reviewed  BASIC METABOLIC PANEL - Abnormal; Notable for the following components:      Result Value   Potassium 3.3 (*)    Glucose, Bld 165 (*)    Calcium 8.7 (*)    All other components within normal limits  CBC  HEPATIC  FUNCTION PANEL  LIPASE, BLOOD  TROPONIN I (HIGH SENSITIVITY)  TROPONIN I (HIGH SENSITIVITY)     EKG Interpreted by me Sinus rhythm rate of 70.  Normal axis and intervals.  Poor R wave progression.  Normal ST segments and T waves.   RADIOLOGY Chest x-ray interpreted by me, appears normal.  Radiology report reviewed.  CT angiogram chest negative   PROCEDURES:  Procedures   MEDICATIONS ORDERED IN ED: Medications  nitroGLYCERIN (NITROSTAT) SL tablet 0.4 mg (0.4 mg Sublingual Given 11/13/22 2051)  pantoprazole (PROTONIX) injection 40 mg (40 mg Intravenous Not Given 11/13/22 2317)  butalbital-acetaminophen-caffeine (FIORICET) 50-325-40 MG per tablet 1 tablet (1 tablet Oral Given 11/13/22 2000)  ketorolac (TORADOL) 15 MG/ML injection 15 mg (15 mg Intravenous Given 11/13/22 2313)  iohexol (OMNIPAQUE) 350 MG/ML injection 100 mL (100 mLs Intravenous Contrast Given 11/13/22 2251)     IMPRESSION / MDM / ASSESSMENT AND PLAN / ED COURSE  I reviewed the triage vital signs and the nursing notes.  DDx: Pneumonia, pulmonary embolism, costochondritis, pancreatitis, gastritis, non-STEMI  Patient's presentation is most consistent with acute  presentation with potential threat to life or bodily function.  Patient presents with atypical chest pain which appears most likely to be musculoskeletal or upper abdominal.  Pain did not respond to nitroglycerin, but I think it is noncardiac.  CT angiogram is negative for PE.  LFTs pending.  If this is unremarkable I think patient can be discharged home to continue antacids, bland diet, anti-inflammatories.      FINAL CLINICAL IMPRESSION(S) / ED DIAGNOSES   Final diagnoses:  Atypical chest pain  Epigastric pain     Rx / DC Orders   ED Discharge Orders     None        Note:  This document was prepared using Dragon voice recognition software and may include unintentional dictation errors.   Carrie Mew, MD 11/13/22 [phone]

## 2022-11-13 NOTE — ED Notes (Signed)
ED Provider at bedside. 

## 2022-11-13 NOTE — ED Notes (Signed)
Patient continues to report severe 10/10 chest pain with pressure and squeezing. Final Nitro administered. Bp improved.

## 2022-11-13 NOTE — ED Triage Notes (Signed)
Central and L sided cp with radiation to neck, shoulder, and jaw. Hx of stent placement. Reports associated h/a and dizziness with nausea. Pt ambulatory to triage. Breathing unlabored with symmetric chest rise and fall. No cough or congestion. Reports associated SOB, worse with exertion. States cp onset this morning.

## 2022-11-16 NOTE — Patient Instructions (Signed)

## 2022-11-19 ENCOUNTER — Ambulatory Visit: Payer: BC Managed Care – PPO | Admitting: Nurse Practitioner

## 2022-11-19 ENCOUNTER — Encounter: Payer: Self-pay | Admitting: Nurse Practitioner

## 2022-11-19 ENCOUNTER — Telehealth: Payer: Self-pay

## 2022-11-19 VITALS — BP 142/80 | HR 54 | Temp 97.7°F | Ht 66.26 in | Wt 201.3 lb

## 2022-11-19 DIAGNOSIS — I25112 Atherosclerotic heart disease of native coronary artery with refractory angina pectoris: Secondary | ICD-10-CM | POA: Diagnosis not present

## 2022-11-19 DIAGNOSIS — E6609 Other obesity due to excess calories: Secondary | ICD-10-CM

## 2022-11-19 DIAGNOSIS — R079 Chest pain, unspecified: Secondary | ICD-10-CM

## 2022-11-19 DIAGNOSIS — K219 Gastro-esophageal reflux disease without esophagitis: Secondary | ICD-10-CM

## 2022-11-19 DIAGNOSIS — Z86718 Personal history of other venous thrombosis and embolism: Secondary | ICD-10-CM

## 2022-11-19 DIAGNOSIS — R7309 Other abnormal glucose: Secondary | ICD-10-CM

## 2022-11-19 DIAGNOSIS — E782 Mixed hyperlipidemia: Secondary | ICD-10-CM | POA: Diagnosis not present

## 2022-11-19 DIAGNOSIS — I10 Essential (primary) hypertension: Secondary | ICD-10-CM

## 2022-11-19 DIAGNOSIS — Z6831 Body mass index (BMI) 31.0-31.9, adult: Secondary | ICD-10-CM

## 2022-11-19 LAB — MICROALBUMIN, URINE WAIVED
Creatinine, Urine Waived: 50 mg/dL (ref 10–300)
Microalb, Ur Waived: 10 mg/L (ref 0–19)

## 2022-11-19 LAB — BAYER DCA HB A1C WAIVED: HB A1C (BAYER DCA - WAIVED): 6.2 % — ABNORMAL HIGH (ref 4.8–5.6)

## 2022-11-19 MED ORDER — APIXABAN 5 MG PO TABS: 5.0000 mg | ORAL_TABLET | Freq: Two times a day (BID) | ORAL | 4 refills | Status: DC

## 2022-11-19 NOTE — Assessment & Plan Note (Signed)
BMI 32.24.  Recommended eating smaller high protein, low fat meals more frequently and exercising 30 mins a day 5 times a week with a goal of 10-15lb weight loss in the next 3 months. Patient voiced their understanding and motivation to adhere to these recommendations.

## 2022-11-19 NOTE — Telephone Encounter (Signed)
Marnee Guarneri T, NP  -- Seeing this patient today -- he was in ER on 11/13/22 for chest pain, continues to have heaviness. This has been going on since you changed him to Hydralazine and stopped Coreg. Continues to have this feeling. EKG in office overall stable today with bradycardia only. Thoughts? Has not taken any NTG.:( Not sure if you want to see him sooner then scheduled. Overall ER visit was reassuring.   Marnee Guarneri T, NP  -- 11:41 AM He has also been out of Ranexa for over 14 days, which may be where this is coming from. Pharmacy is giving him issues with this saying it needs a PA. I see where you refilled it too. FYI.  Marnee Guarneri T, NP  -- 11:51 AM JC We called [facility] and they have said it is filled --fingers crossed, I suspect his increased CP may be related to being out of Ranexa vs HTN med changes. EKG in office today stable brady.  12:19 PM BA Kate Sable, MD lets see how he does with restarting ranexa. Bradycardia prevents BB. cialis use prevents nitrates. we will try to see him earlier pending scheduling availability. Thanks  24 mins You were added by Kate Sable, MD. 24 mins JC Venita Lick, NP Thank you. I was frustrated that pharmacy had not filled Ranexa and he was out for 14 days with chest pain.    Called patient and offered a sooner appt (12/22/2022 with cadence furth, PA) Patient declined appt due to him having a trip [place] in [place].

## 2022-11-19 NOTE — Assessment & Plan Note (Signed)
Chronic, ongoing with recent medication changes by cardiology will defer further changes at this time.  BP remains slightly above goal.  Recommend he monitor BP at least a few mornings a week at home and document.  DASH diet at home.  Continue current medication regimen and adjust as needed.  Labs today: CMP and lipid.  Continue collaboration with cardiology, secure chat sent to them today to update them.

## 2022-11-19 NOTE — Assessment & Plan Note (Signed)
A1c remains 6.2% today, no trend up.  Recommend continue focus on diet and exercise. Urine ALB 16 November 2022.

## 2022-11-19 NOTE — Assessment & Plan Note (Signed)
Chronic, exacerbated recently with HTN medication changes and being out of Ranexa for 14 days.  Reached out to cardiology via secure chart to alert them and attain recommendations.  CMA reached out to pharmacy who reports Ranexa is ready for pick-up, alerted patient to this and he will get today -- to alert [place] if any issues.  Suspect recent CP episode related to being out of Ranexa vs HTN medication changes.  Continue current medication regimen and collaboration with cardiology.  Recent note reviewed.  Obtain labs today.

## 2022-11-19 NOTE — Assessment & Plan Note (Signed)
Chronic, ongoing.  Continue current medication regimen and adjust as needed. Lipid panel today. 

## 2022-11-19 NOTE — Assessment & Plan Note (Signed)
Chronic, ongoing.  Well-controlled with PPI, has been on for years.  Tried reductions without success.  Risks of PPI use were discussed with patient including bone loss, C. Diff diarrhea, pneumonia, infections, CKD, electrolyte abnormalities. Verbalizes understanding and chooses to continue the medication.  Mag level annually.

## 2022-11-19 NOTE — Progress Notes (Signed)
BP (!) 142/80 (BP Location: Left Arm, Patient Position: Sitting, Cuff Size: Normal)   Pulse (!) 54   Temp 97.7 F (36.5 C) (Oral)   Ht 5' 6.26" (1.683 m)   Wt 201 lb 4.8 oz (91.3 kg)   SpO2 96%   BMI 32.24 kg/m    Subjective:    Patient ID: Jaime [name], male    DOB: Mar 09, 1961, 62 y.o.   MRN: [id number]  HPI: Jaime [name] is a 62 y.o. male  Chief Complaint  Patient presents with   Chest Pain    Follow up from Chest pain , was seen in [facility] ER on Thursday last week. Patient states that he is still having pain and pressure, states it feels like someone is pushing on his chest and heartburn at same time.Has not used Nitroglycerin that was given to him at hospital. Patient states that the pain he is feeling moves from chest to back, to left neck, left shoulder and left arm.    Hypertension   Hyperlipidemia   ER FOLLOW UP Seen in ER on 11/13/22 for chest pain.  Having pain and pressure to chest mid sternal which radiates to posterior left shoulder blade and neck. Denies nausea, diaphoresis, fatigue.  A little SOB present with this + slight headache.  Feels like someone is pushing on chest.  Troponin levels stable in ER.  Labs in ER noted elevation in glucose at 165 + calcium 8.7.  Continues to have discomfort, but not near as bad.  Has not taken any NTG since hospital, was given 3 doses in hospital and this did help.   Time since discharge:  Hospital/facility: [facility] Diagnosis: atypical chest pain Procedures/tests: EKG and labs Consultants: none New medications: none Discharge instructions:  follow-up with PCP Status: stable   HYPERTENSION / HYPERLIPIDEMIA Currently Amlodipine, Hydralazine, Losartan, ASA, Vascepa, Crestor, Ranexa + NTG.  Follows with cardiology last visit 10/22/22 and they added on Hydralazine and stopped Coreg.    He tried to get Ranexa filled 14 days ago, pharmacy is reporting they can not fill it -- states he may need a PA. Has been out of this.  Has  history of blood clots multiple times (DVTs, after knee surgery and with Covid) and reports previous PCP took him off Eliquis, but recommendations on old records report lifelong use and he reports being told this due to significant history of multiple clots. Satisfied with current treatment? yes Duration of hypertension: chronic BP monitoring frequency: rarely BP range: 130/80 range with HR 60 range BP medication side effects: no Duration of hyperlipidemia: chronic Cholesterol medication side effects: no Cholesterol supplements: none Medication compliance: good compliance Aspirin: yes Recent stressors: no Recurrent headaches: no Visual changes: no Palpitations: no Dyspnea: occasional Chest pain: as above Lower extremity edema: no Dizzy/lightheaded: no   GERD Continues on Protonix 40 MG BID, has been on 2012 -- has tried to come off and this caused worsening -- more chest pain. GERD control status: stable Satisfied with current treatment? yes Heartburn frequency:  Medication side effects: no  Medication compliance: stable Dysphagia: no Odynophagia:  no Hematemesis: no Blood in stool: no EGD: yes   PREDIABETES On A1c check in November A1c 6.2%. Polydipsia/polyuria: no Visual disturbance: no Chest pain:  as above  Relevant past medical, surgical, family and social history reviewed and updated as indicated. Interim medical history since our last visit reviewed. Allergies and medications reviewed and updated.  Review of Systems  Constitutional:  Negative for activity change, diaphoresis, fatigue  and fever.  Respiratory:  Negative for cough, chest tightness, shortness of breath and wheezing.   Cardiovascular:  Positive for chest pain. Negative for palpitations and leg swelling.  Gastrointestinal: Negative.   Neurological: Negative.   Psychiatric/Behavioral: Negative.      Per HPI unless specifically indicated above     Objective:    BP (!) 142/80 (BP Location: Left  Arm, Patient Position: Sitting, Cuff Size: Normal)   Pulse (!) 54   Temp 97.7 F (36.5 C) (Oral)   Ht 5' 6.26" (1.683 m)   Wt 201 lb 4.8 oz (91.3 kg)   SpO2 96%   BMI 32.24 kg/m   Wt Readings from Last 3 Encounters:  11/19/22 201 lb 4.8 oz (91.3 kg)  11/13/22 200 lb (90.7 kg)  10/22/22 200 lb (90.7 kg)    Physical Exam Vitals and nursing note reviewed.  Constitutional:      General: He is awake. He is not in acute distress.    Appearance: He is well-developed and well-groomed. He is obese. He is not ill-appearing or toxic-appearing.  HENT:     Head: Normocephalic.     Right Ear: Hearing and external ear normal.     Left Ear: Hearing and external ear normal.  Eyes:     General: Lids are normal.     Extraocular Movements: Extraocular movements intact.     Conjunctiva/sclera: Conjunctivae normal.  Neck:     Thyroid: No thyromegaly.     Vascular: No carotid bruit.  Cardiovascular:     Rate and Rhythm: Normal rate and regular rhythm.     Heart sounds: Normal heart sounds.  Pulmonary:     Effort: No accessory muscle usage or respiratory distress.     Breath sounds: Normal breath sounds.  Abdominal:     General: Bowel sounds are normal. There is no distension.     Palpations: Abdomen is soft.     Tenderness: There is no abdominal tenderness.  Musculoskeletal:     Cervical back: Full passive range of motion without pain.     Right lower leg: No edema.     Left lower leg: No edema.  Lymphadenopathy:     Cervical: No cervical adenopathy.  Skin:    General: Skin is warm.     Capillary Refill: Capillary refill takes less than 2 seconds.  Neurological:     Mental Status: He is alert and oriented to person, place, and time.     Deep Tendon Reflexes: Reflexes are normal and symmetric.     Reflex Scores:      Brachioradialis reflexes are 2+ on the right side and 2+ on the left side.      Patellar reflexes are 2+ on the right side and 2+ on the left side. Psychiatric:         Attention and Perception: Attention normal.        Mood and Affect: Mood normal.        Speech: Speech normal.        Behavior: Behavior normal. Behavior is cooperative.        Thought Content: Thought content normal.    EKG My review and personal interpretation at Time: 1150  Indication: chest pain  Rate: 56  Rhythm: sinus Axis: normal Other: no nonspecific st abn, no stemi, no lvh  Results for orders placed or performed during the hospital encounter of [id number]  Basic metabolic panel  Result Value Ref Range   Sodium 136 135 - 145 mmol/L  Potassium 3.3 (L) 3.5 - 5.1 mmol/L   Chloride 99 98 - 111 mmol/L   CO2 26 22 - 32 mmol/L   Glucose, Bld 165 (H) 70 - 99 mg/dL   BUN 18 6 - 20 mg/dL   Creatinine, Ser 1.08 0.61 - 1.24 mg/dL   Calcium 8.7 (L) 8.9 - 10.3 mg/dL   GFR, Estimated >60 >60 mL/min   Anion gap 11 5 - 15  CBC  Result Value Ref Range   WBC 7.1 4.0 - 10.5 K/uL   RBC 5.04 4.22 - 5.81 MIL/uL   Hemoglobin 15.4 13.0 - 17.0 g/dL   HCT 45.6 39.0 - 52.0 %   MCV 90.5 80.0 - 100.0 fL   MCH 30.6 26.0 - 34.0 pg   MCHC 33.8 30.0 - 36.0 g/dL   RDW 12.4 11.5 - 15.5 %   Platelets 194 150 - 400 K/uL   nRBC 0.0 0.0 - 0.2 %  Hepatic function panel  Result Value Ref Range   Total Protein 6.8 6.5 - 8.1 g/dL   Albumin 3.8 3.5 - 5.0 g/dL   AST 25 15 - 41 U/L   ALT 30 0 - 44 U/L   Alkaline Phosphatase 60 38 - 126 U/L   Total Bilirubin 0.8 0.3 - 1.2 mg/dL   Bilirubin, Direct <0.1 0.0 - 0.2 mg/dL   Indirect Bilirubin NOT CALCULATED 0.3 - 0.9 mg/dL  Lipase, blood  Result Value Ref Range   Lipase 35 11 - 51 U/L  Troponin I (High Sensitivity)  Result Value Ref Range   Troponin I (High Sensitivity) <2 <18 ng/L  Troponin I (High Sensitivity)  Result Value Ref Range   Troponin I (High Sensitivity) 3 <18 ng/L      Assessment & Plan:   Problem List Items Addressed This Visit       Cardiovascular and Mediastinum   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  Reached out to cardiology via secure chart to alert them and attain recommendations.  CMA reached out to pharmacy who reports Ranexa is ready for pick-up, alerted patient to this and he will get today -- to alert [place] if any issues.  Suspect recent CP episode related to being out of Ranexa vs HTN medication changes.  Continue current medication regimen and collaboration with cardiology.  Recent note reviewed.  Obtain labs today.      Relevant Medications   apixaban (ELIQUIS) 5 MG TABS tablet   Other Relevant Orders   Comprehensive metabolic panel   Lipid Panel w/o Chol/HDL Ratio   Primary hypertension    Chronic, ongoing with recent medication changes by cardiology will defer further changes at this time.  BP remains slightly above goal.  Recommend he monitor BP at least a few mornings a week at home and document.  DASH diet at home.  Continue current medication regimen and adjust as needed.  Labs today: CMP and lipid.  Continue collaboration with cardiology, secure chat sent to them today to update them.         Relevant Medications   apixaban (ELIQUIS) 5 MG TABS tablet   Other Relevant Orders   Microalbumin, Urine Waived   Comprehensive metabolic panel     Digestive   GERD without esophagitis    Chronic, ongoing.  Well-controlled with PPI, has been on for years.  Tried reductions without success.  Risks of PPI use were discussed with patient including bone loss, C. Diff diarrhea, pneumonia, infections, CKD, electrolyte abnormalities. Verbalizes understanding and chooses to continue the medication.  Mag level annually.         Other   Chest pain    Underlying angina at baseline.  Exacerbated recently with HTN medication changes and being out of Ranexa for 14 days.  Reached out to cardiology via secure chart to alert them and attain recommendations.  CMA  reached out to pharmacy who reports Ranexa is ready for pick-up, alerted patient to this and he will get today -- to alert [place] if any issues.  Suspect recent CP episode related to being out of Ranexa vs HTN medication changes, however will monitor closely.  Recommend NTG use at home as needed, since this offered benefit in ER.  Suspect once back on Ranexa CP may improve.  Return in 2 weeks for recheck.      Relevant Orders   EKG 12-Lead (Completed)   Elevated hemoglobin A1c measurement    A1c remains 6.2% today, no trend up.  Recommend continue focus on diet and exercise. Urine ALB 16 November 2022.      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  Will restart Eliquis 5 MG BID for prevention and may warrant a visit to hematology in future for work-up to ensure no underlying cause for frequent DVTs.      Hyperlipidemia    Chronic, ongoing.  Continue current medication regimen and adjust as needed.  Lipid panel today.      Relevant Medications   apixaban (ELIQUIS) 5 MG TABS tablet   Other Relevant Orders   Comprehensive metabolic panel   Lipid Panel w/o Chol/HDL Ratio   Obesity    BMI 32.24.  Recommended eating smaller high protein, low fat meals more frequently and exercising 30 mins a day 5 times a week with a goal of 10-15lb weight loss in the next 3 months. Patient voiced their understanding and motivation to adhere to these recommendations.         Follow up plan: Return in about 2 weeks (around 12/03/2022) for Chest pain.

## 2022-11-19 NOTE — Assessment & Plan Note (Signed)
Underlying angina at baseline.  Exacerbated recently with HTN medication changes and being out of Ranexa for 14 days.  Reached out to cardiology via secure chart to alert them and attain recommendations.  CMA reached out to pharmacy who reports Ranexa is ready for pick-up, alerted patient to this and he will get today -- to alert [place] if any issues.  Suspect recent CP episode related to being out of Ranexa vs HTN medication changes, however will monitor closely.  Recommend NTG use at home as needed, since this offered benefit in ER.  Suspect once back on Ranexa CP may improve.  Return in 2 weeks for recheck.

## 2022-11-19 NOTE — Assessment & Plan Note (Signed)
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  Will restart Eliquis 5 MG BID for prevention and may warrant a visit to hematology in future for work-up to ensure no underlying cause for frequent DVTs.

## 2022-11-20 LAB — COMPREHENSIVE METABOLIC PANEL
ALT: 41 IU/L (ref 0–44)
AST: 34 IU/L (ref 0–40)
Albumin/Globulin Ratio: 1.8 (ref 1.2–2.2)
Albumin: 4.5 g/dL (ref 3.8–4.9)
Alkaline Phosphatase: 80 IU/L (ref 44–121)
BUN/Creatinine Ratio: 12 (ref 10–24)
BUN: 13 mg/dL (ref 8–27)
Bilirubin Total: 0.4 mg/dL (ref 0.0–1.2)
CO2: 24 mmol/L (ref 20–29)
Calcium: 9.5 mg/dL (ref 8.6–10.2)
Chloride: 101 mmol/L (ref 96–106)
Creatinine, Ser: 1.09 mg/dL (ref 0.76–1.27)
Globulin, Total: 2.5 g/dL (ref 1.5–4.5)
Glucose: 104 mg/dL — ABNORMAL HIGH (ref 70–99)
Potassium: 3.7 mmol/L (ref 3.5–5.2)
Sodium: 142 mmol/L (ref 134–144)
Total Protein: 7 g/dL (ref 6.0–8.5)
eGFR: 78 mL/min/{1.73_m2} (ref >59)

## 2022-11-20 LAB — LIPID PANEL W/O CHOL/HDL RATIO
Cholesterol, Total: 107 mg/dL (ref 100–199)
HDL: 38 mg/dL — ABNORMAL LOW (ref >39)
LDL Chol Calc (NIH): 52 mg/dL (ref 0–99)
Triglycerides: 89 mg/dL (ref 0–149)
VLDL Cholesterol Cal: 17 mg/dL (ref 5–40)

## 2022-11-20 NOTE — Progress Notes (Signed)
Contacted via [place] [facility] [place], your labs have returned.  How is your chest pain since restarting your medication? - Kidney function, creatinine and eGFR, remains normal, as is liver function, AST and ALT.  - Cholesterol labs at goal with current medications.  Wonderful. - The A1c is the diabetes testing we talked about, this looks at your blood sugars over the past 3 months and turns the average into a number.  Your number is 6.2%, meaning you are prediabetic, this is similar to November check.  Any number 5.7 to 6.4 is considered prediabetes and any number 6.5 or greater is considered diabetes.   I would recommend heavy focus on decreasing foods high in sugar and your intake of things like bread products, pasta, and rice.  The American Diabetes Association online has a large amount of information on diet changes to make.  We will recheck this number in 3 months to ensure you are not continuing to trend upwards and move into diabetes.  Any questions for me on these? Keep being stellar!!  Thank you for allowing me to participate in your care.  I appreciate you. Kindest regards, Satrina Magallanes

## 2022-12-04 ENCOUNTER — Ambulatory Visit: Payer: BC Managed Care – PPO | Admitting: Physician Assistant

## 2022-12-22 ENCOUNTER — Ambulatory Visit: Payer: BC Managed Care – PPO | Admitting: Medical

## 2023-01-04 NOTE — Patient Instructions (Signed)
Be Involved in Your Health Care:  Taking Medications When medications are taken as directed, they can greatly improve your health. But if they are not taken as instructed, they may not work. In some cases, not taking them correctly can be harmful. To help ensure your treatment remains effective and safe, understand your medications and how to take them.  Your lab results, notes and after visit summary will be available on My Chart. We strongly encourage you to use this feature. If lab results are abnormal the clinic will contact you with the appropriate steps. If the clinic does not contact you assume the results are satisfactory. You can always see your results on My Chart. If you have questions regarding your condition, please contact the clinic during office hours. You can also ask questions on My Chart.  We at [facility] are grateful that you chose us to provide care. We strive to provide excellent and compassionate care and are always looking for feedback. If you get a survey from the clinic please complete this.   Prediabetes Eating Plan Prediabetes is a condition that causes blood sugar (glucose) levels to be higher than normal. This increases the risk for developing type 2 diabetes (type 2 diabetes mellitus). Working with a health care provider or nutrition specialist (dietitian) to make diet and lifestyle changes can help prevent the onset of diabetes. These changes may help you: Control your blood glucose levels. Improve your cholesterol levels. Manage your blood pressure. What are tips for following this plan? Reading food labels Read food labels to check the amount of fat, salt (sodium), and sugar in prepackaged foods. Avoid foods that have: Saturated fats. Trans fats. Added sugars. Avoid foods that have more than 300 milligrams (mg) of sodium per serving. Limit your sodium intake to less than 2,300 mg each day. Shopping Avoid buying pre-made and processed foods. Avoid  buying drinks with added sugar. Cooking Cook with olive oil. Do not use butter, lard, or ghee. Bake, broil, grill, steam, or boil foods. Avoid frying. Meal planning  Work with your dietitian to create an eating plan that is right for you. This may include tracking how many calories you take in each day. Use a food diary, notebook, or mobile application to track what you eat at each meal. Consider following a Mediterranean diet. This includes: Eating several servings of fresh fruits and vegetables each day. Eating fish at least twice a week. Eating one serving each day of whole grains, beans, nuts, and seeds. Using olive oil instead of other fats. Limiting alcohol. Limiting red meat. Using nonfat or low-fat dairy products. Consider following a plant-based diet. This includes dietary choices that focus on eating mostly vegetables and fruit, grains, beans, nuts, and seeds. If you have high blood pressure, you may need to limit your sodium intake or follow a diet such as the DASH (Dietary Approaches to Stop Hypertension) eating plan. The DASH diet aims to lower high blood pressure. Lifestyle Set weight loss goals with help from your health care team. It is recommended that most people with prediabetes lose 7% of their body weight. Exercise for at least 30 minutes 5 or more days a week. Attend a support group or seek support from a mental health counselor. Take over-the-counter and prescription medicines only as told by your health care provider. What foods are recommended? Fruits Berries. Bananas. Apples. Oranges. Grapes. Papaya. Mango. Pomegranate. Kiwi. Grapefruit. Cherries. Vegetables Lettuce. Spinach. Peas. Beets. Cauliflower. Cabbage. Broccoli. Carrots. Tomatoes. Squash. Eggplant. Herbs.   Peppers. Onions. Cucumbers. Brussels sprouts. Grains Whole grains, such as whole-wheat or whole-grain breads, crackers, cereals, and pasta. Unsweetened oatmeal. Bulgur. Barley. Quinoa. Brown rice. Corn  or whole-wheat flour tortillas or taco shells. Meats and other proteins Seafood. Poultry without skin. Lean cuts of pork and beef. Tofu. Eggs. Nuts. Beans. Dairy Low-fat or fat-free dairy products, such as yogurt, cottage cheese, and cheese. Beverages Water. Tea. Coffee. Sugar-free or diet soda. Seltzer water. Low-fat or nonfat milk. Milk alternatives, such as soy or almond milk. Fats and oils Olive oil. Canola oil. Sunflower oil. Grapeseed oil. Avocado. Walnuts. Sweets and desserts Sugar-free or low-fat pudding. Sugar-free or low-fat ice cream and other frozen treats. Seasonings and condiments Herbs. Sodium-free spices. Mustard. Relish. Low-salt, low-sugar ketchup. Low-salt, low-sugar barbecue sauce. Low-fat or fat-free mayonnaise. The items listed above may not be a complete list of recommended foods and beverages. Contact a dietitian for more information. What foods are not recommended? Fruits Fruits canned with syrup. Vegetables Canned vegetables. Frozen vegetables with butter or cream sauce. Grains Refined white flour and flour products, such as bread, pasta, snack foods, and cereals. Meats and other proteins Fatty cuts of meat. Poultry with skin. Breaded or fried meat. Processed meats. Dairy Full-fat yogurt, cheese, or milk. Beverages Sweetened drinks, such as iced tea and soda. Fats and oils Butter. Lard. Ghee. Sweets and desserts Baked goods, such as cake, cupcakes, pastries, cookies, and cheesecake. Seasonings and condiments Spice mixes with added salt. Ketchup. Barbecue sauce. Mayonnaise. The items listed above may not be a complete list of foods and beverages that are not recommended. Contact a dietitian for more information. Where to find more information American Diabetes Association: www.diabetes.org Summary You may need to make diet and lifestyle changes to help prevent the onset of diabetes. These changes can help you control blood sugar, improve cholesterol  levels, and manage blood pressure. Set weight loss goals with help from your health care team. It is recommended that most people with prediabetes lose 7% of their body weight. Consider following a Mediterranean diet. This includes eating plenty of fresh fruits and vegetables, whole grains, beans, nuts, seeds, fish, and low-fat dairy, and using olive oil instead of other fats. This information is not intended to replace advice given to you by your health care provider. Make sure you discuss any questions you have with your health care provider. Document Revised: 11/24/2019 Document Reviewed: 11/24/2019 Elsevier Patient Education  2023 Elsevier Inc.  

## 2023-01-05 ENCOUNTER — Encounter: Payer: Self-pay | Admitting: Nurse Practitioner

## 2023-01-05 ENCOUNTER — Ambulatory Visit: Payer: BC Managed Care – PPO | Admitting: Nurse Practitioner

## 2023-01-05 VITALS — BP 132/80 | HR 72 | Temp 97.8°F | Resp 18 | Ht 66.26 in | Wt 194.4 lb

## 2023-01-05 DIAGNOSIS — E782 Mixed hyperlipidemia: Secondary | ICD-10-CM

## 2023-01-05 DIAGNOSIS — Z86718 Personal history of other venous thrombosis and embolism: Secondary | ICD-10-CM

## 2023-01-05 DIAGNOSIS — K219 Gastro-esophageal reflux disease without esophagitis: Secondary | ICD-10-CM

## 2023-01-05 DIAGNOSIS — R7309 Other abnormal glucose: Secondary | ICD-10-CM

## 2023-01-05 DIAGNOSIS — I10 Essential (primary) hypertension: Secondary | ICD-10-CM | POA: Diagnosis not present

## 2023-01-05 DIAGNOSIS — I25112 Atherosclerotic heart disease of native coronary artery with refractory angina pectoris: Secondary | ICD-10-CM | POA: Diagnosis not present

## 2023-01-05 DIAGNOSIS — Z6831 Body mass index (BMI) 31.0-31.9, adult: Secondary | ICD-10-CM

## 2023-01-05 DIAGNOSIS — E6609 Other obesity due to excess calories: Secondary | ICD-10-CM

## 2023-01-05 LAB — BAYER DCA HB A1C WAIVED: HB A1C (BAYER DCA - WAIVED): 6.2 % — ABNORMAL HIGH (ref 4.8–5.6)

## 2023-01-05 MED ORDER — RANOLAZINE ER 1000 MG PO TB12: 1000.0000 mg | ORAL_TABLET | Freq: Two times a day (BID) | ORAL | 4 refills | Status: DC

## 2023-01-05 MED ORDER — HYDROCHLOROTHIAZIDE 25 MG PO TABS: 25.0000 mg | ORAL_TABLET | Freq: Every day | ORAL | 4 refills | Status: DC

## 2023-01-05 MED ORDER — ICOSAPENT ETHYL 1 G PO CAPS: 2.0000 g | ORAL_CAPSULE | Freq: Two times a day (BID) | ORAL | 4 refills | Status: DC

## 2023-01-05 MED ORDER — PANTOPRAZOLE SODIUM 40 MG PO TBEC: 40.0000 mg | DELAYED_RELEASE_TABLET | Freq: Two times a day (BID) | ORAL | 4 refills | Status: DC

## 2023-01-05 MED ORDER — LOSARTAN POTASSIUM 100 MG PO TABS: 100.0000 mg | ORAL_TABLET | Freq: Every day | ORAL | 4 refills | Status: DC

## 2023-01-05 NOTE — Assessment & Plan Note (Signed)
Chronic, ongoing.  Continue current medication regimen and adjust as needed. Lipid panel today. 

## 2023-01-05 NOTE — Assessment & Plan Note (Signed)
A1c remains 6.2% today, no trend up.  Recommend continue focus on diet and exercise. Urine ALB 16 November 2022. 

## 2023-01-05 NOTE — Assessment & Plan Note (Addendum)
Chronic, sable with no recent chest pain.  Continue collaboration with cardiology and reviewed recent notes and labs.

## 2023-01-05 NOTE — Assessment & Plan Note (Signed)
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  Will continue Eliquis 5 MG BID for prevention and may warrant a visit to hematology in future for work-up to ensure no underlying cause for frequent DVTs.

## 2023-01-05 NOTE — Assessment & Plan Note (Signed)
BMI 31.13.  Recommended eating smaller high protein, low fat meals more frequently and exercising 30 mins a day 5 times a week with a goal of 10-15lb weight loss in the next 3 months. Patient voiced their understanding and motivation to adhere to these recommendations. ? ?

## 2023-01-05 NOTE — Assessment & Plan Note (Signed)
Chronic, stable.  BP remains at goal.  Recommend he monitor BP at least a few mornings a week at home and document.  DASH diet at home.  Continue current medication regimen and adjust as needed.  Labs today: CMP and lipid.  Continue collaboration with cardiology, recent notes reviewed.

## 2023-01-05 NOTE — Assessment & Plan Note (Signed)
Chronic, ongoing.  Well-controlled with PPI, has been on for years.  Tried reductions without success.  Risks of PPI use were discussed with patient including bone loss, C. Diff diarrhea, pneumonia, infections, CKD, electrolyte abnormalities. Verbalizes understanding and chooses to continue the medication.  Mag level annually.  

## 2023-01-05 NOTE — Progress Notes (Signed)
BP 132/80 (BP Location: Left Arm, Patient Position: Sitting, Cuff Size: Normal)   Pulse 72   Temp 97.8 F (36.6 C) (Oral)   Resp 18   Ht 5' 6.26" (1.683 m)   Wt 194 lb 6.4 oz (88.2 kg)   SpO2 97%   BMI 31.13 kg/m    Subjective:    Patient ID: Jaime [name], male    DOB: 06/03/1961, 62 y.o.   MRN: [id number]  HPI: Jaime [name] is a 62 y.o. male  Chief Complaint  Patient presents with   Hypertension   Hyperlipidemia    PT has eaten today    Gastroesophageal Reflux   H/O DVT   HYPERTENSION / HYPERLIPIDEMIA Currently Amlodipine, Hydralazine, Losartan, ASA, Vascepa, Crestor, Ranexa.  Follows with cardiology last visit 10/22/22 when they started Hydralazine and stopped Coreg.  Continues on Eliquis for history of DVT.   Satisfied with current treatment? yes Duration of hypertension: chronic BP monitoring frequency: rarely BP range:  BP medication side effects: no Duration of hyperlipidemia: chronic Cholesterol medication side effects: no Cholesterol supplements: none Medication compliance: good compliance Aspirin: yes Recent stressors: no Recurrent headaches: no Visual changes: no Palpitations: no Dyspnea: no Chest pain: no Lower extremity edema: no Dizzy/lightheaded: no   GERD Continues on Protonix.  Has taken this since 2012 -- has tried to come off and this caused worsening symptoms in past.   GERD control status: stable Satisfied with current treatment? yes Heartburn frequency:  Medication side effects: no  Medication compliance: stable Dysphagia: no Odynophagia:  no Hematemesis: no Blood in stool: no EGD: yes   PREDIABETES A1c in November was 6.2%. Polydipsia/polyuria: no Visual disturbance: no Chest pain: no Paresthesias: no  Relevant past medical, surgical, family and social history reviewed and updated as indicated. Interim medical history since our last visit reviewed. Allergies and medications reviewed and updated.  Review of Systems   Constitutional:  Negative for activity change, diaphoresis, fatigue and fever.  Respiratory:  Negative for cough, chest tightness, shortness of breath and wheezing.   Cardiovascular:  Negative for chest pain, palpitations and leg swelling.  Gastrointestinal: Negative.   Neurological: Negative.   Psychiatric/Behavioral: Negative.     Per HPI unless specifically indicated above     Objective:    BP 132/80 (BP Location: Left Arm, Patient Position: Sitting, Cuff Size: Normal)   Pulse 72   Temp 97.8 F (36.6 C) (Oral)   Resp 18   Ht 5' 6.26" (1.683 m)   Wt 194 lb 6.4 oz (88.2 kg)   SpO2 97%   BMI 31.13 kg/m   Wt Readings from Last 3 Encounters:  01/05/23 194 lb 6.4 oz (88.2 kg)  11/19/22 201 lb 4.8 oz (91.3 kg)  11/13/22 200 lb (90.7 kg)    Physical Exam Vitals and nursing note reviewed.  Constitutional:      General: He is awake. He is not in acute distress.    Appearance: He is well-developed and well-groomed. He is obese. He is not ill-appearing or toxic-appearing.  HENT:     Head: Normocephalic.     Right Ear: Hearing and external ear normal.     Left Ear: Hearing and external ear normal.  Eyes:     General: Lids are normal.     Extraocular Movements: Extraocular movements intact.     Conjunctiva/sclera: Conjunctivae normal.  Neck:     Thyroid: No thyromegaly.     Vascular: No carotid bruit.  Cardiovascular:     Rate and Rhythm: Normal  rate and regular rhythm.     Heart sounds: Normal heart sounds.  Pulmonary:     Effort: No accessory muscle usage or respiratory distress.     Breath sounds: Normal breath sounds.  Abdominal:     General: Bowel sounds are normal. There is no distension.     Palpations: Abdomen is soft.     Tenderness: There is no abdominal tenderness.  Musculoskeletal:     Cervical back: Full passive range of motion without pain.     Right lower leg: No edema.     Left lower leg: No edema.  Lymphadenopathy:     Cervical: No cervical  adenopathy.  Skin:    General: Skin is warm.     Capillary Refill: Capillary refill takes less than 2 seconds.  Neurological:     Mental Status: He is alert and oriented to person, place, and time.     Deep Tendon Reflexes: Reflexes are normal and symmetric.     Reflex Scores:      Brachioradialis reflexes are 2+ on the right side and 2+ on the left side.      Patellar reflexes are 2+ on the right side and 2+ on the left side. Psychiatric:        Attention and Perception: Attention normal.        Mood and Affect: Mood normal.        Speech: Speech normal.        Behavior: Behavior normal. Behavior is cooperative.        Thought Content: Thought content normal.    Results for orders placed or performed in visit on 11/19/22  Bayer DCA Hb A1c Waived  Result Value Ref Range   HB A1C (BAYER DCA - WAIVED) 6.2 (H) 4.8 - 5.6 %  Microalbumin, Urine Waived  Result Value Ref Range   Microalb, Ur Waived 10 0 - 19 mg/L   Creatinine, Urine Waived 50 10 - 300 mg/dL   Microalb/Creat Ratio 30-300 (H) <30 mg/g  Comprehensive metabolic panel  Result Value Ref Range   Glucose 104 (H) 70 - 99 mg/dL   BUN 13 8 - 27 mg/dL   Creatinine, Ser 1.61 0.76 - 1.27 mg/dL   eGFR 78 >09 UE/AVW/0.98   BUN/Creatinine Ratio 12 10 - 24   Sodium 142 134 - 144 mmol/L   Potassium 3.7 3.5 - 5.2 mmol/L   Chloride 101 96 - 106 mmol/L   CO2 24 20 - 29 mmol/L   Calcium 9.5 8.6 - 10.2 mg/dL   Total Protein 7.0 6.0 - 8.5 g/dL   Albumin 4.5 3.8 - 4.9 g/dL   Globulin, Total 2.5 1.5 - 4.5 g/dL   Albumin/Globulin Ratio 1.8 1.2 - 2.2   Bilirubin Total 0.4 0.0 - 1.2 mg/dL   Alkaline Phosphatase 80 44 - 121 IU/L   AST 34 0 - 40 IU/L   ALT 41 0 - 44 IU/L  Lipid Panel w/o Chol/HDL Ratio  Result Value Ref Range   Cholesterol, Total 107 100 - 199 mg/dL   Triglycerides 89 0 - 149 mg/dL   HDL 38 (L) >11 mg/dL   VLDL Cholesterol Cal 17 5 - 40 mg/dL   LDL Chol Calc (NIH) 52 0 - 99 mg/dL      Assessment & Plan:   Problem  List Items Addressed This Visit       Cardiovascular and Mediastinum   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  Continue collaboration with cardiology and reviewed recent notes and labs.      Relevant Medications   hydrochlorothiazide (HYDRODIURIL) 25 MG tablet   icosapent Ethyl (VASCEPA) 1 g capsule   losartan (COZAAR) 100 MG tablet   ranolazine (RANEXA) 1000 MG SR tablet   Primary hypertension - Primary    Chronic, stable.  BP remains at goal.  Recommend he monitor BP at least a few mornings a week at home and document.  DASH diet at home.  Continue current medication regimen and adjust as needed.  Labs today: CMP and lipid.  Continue collaboration with cardiology, recent notes reviewed.       Relevant Medications   hydrochlorothiazide (HYDRODIURIL) 25 MG tablet   icosapent Ethyl (VASCEPA) 1 g capsule   losartan (COZAAR) 100 MG tablet   ranolazine (RANEXA) 1000 MG SR tablet   Other Relevant Orders   Comprehensive metabolic panel     Digestive   GERD without esophagitis    Chronic, ongoing.  Well-controlled with PPI, has been on for years.  Tried reductions without success.  Risks of PPI use were discussed with patient including bone loss, C. Diff diarrhea, pneumonia, infections, CKD, electrolyte abnormalities. Verbalizes understanding and chooses to continue the medication.  Mag level annually.       Relevant Medications   pantoprazole (PROTONIX) 40 MG tablet     Other   Elevated hemoglobin A1c measurement    A1c remains 6.2% today, no trend up.  Recommend continue focus on diet and exercise. Urine ALB 16 November 2022.      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  Will continue Eliquis 5 MG BID for prevention and may warrant  a visit to hematology in future for work-up to ensure no underlying cause for frequent DVTs.      Hyperlipidemia    Chronic, ongoing.  Continue current medication regimen and adjust as needed.  Lipid panel today.      Relevant Medications   hydrochlorothiazide (HYDRODIURIL) 25 MG tablet   icosapent Ethyl (VASCEPA) 1 g capsule   losartan (COZAAR) 100 MG tablet   ranolazine (RANEXA) 1000 MG SR tablet   Other Relevant Orders   Comprehensive metabolic panel   Lipid Panel w/o Chol/HDL Ratio   Obesity    BMI 31.13.  Recommended eating smaller high protein, low fat meals more frequently and exercising 30 mins a day 5 times a week with a goal of 10-15lb weight loss in the next 3 months. Patient voiced their understanding and motivation to adhere to these recommendations.         Follow up plan: Return in about 6 months (around 07/07/2023) for HTN/HLD, Prediabetes, GERD.

## 2023-01-06 LAB — COMPREHENSIVE METABOLIC PANEL
ALT: 32 IU/L (ref 0–44)
AST: 21 IU/L (ref 0–40)
Albumin/Globulin Ratio: 1.9 (ref 1.2–2.2)
Albumin: 4.3 g/dL (ref 3.8–4.9)
Alkaline Phosphatase: 88 IU/L (ref 44–121)
BUN/Creatinine Ratio: 18 (ref 10–24)
BUN: 22 mg/dL (ref 8–27)
Bilirubin Total: 0.5 mg/dL (ref 0.0–1.2)
CO2: 20 mmol/L (ref 20–29)
Calcium: 9.3 mg/dL (ref 8.6–10.2)
Chloride: 99 mmol/L (ref 96–106)
Creatinine, Ser: 1.23 mg/dL (ref 0.76–1.27)
Globulin, Total: 2.3 g/dL (ref 1.5–4.5)
Glucose: 177 mg/dL — ABNORMAL HIGH (ref 70–99)
Potassium: 3.3 mmol/L — ABNORMAL LOW (ref 3.5–5.2)
Sodium: 141 mmol/L (ref 134–144)
Total Protein: 6.6 g/dL (ref 6.0–8.5)
eGFR: 67 mL/min/{1.73_m2} (ref >59)

## 2023-01-06 LAB — LIPID PANEL W/O CHOL/HDL RATIO
Cholesterol, Total: 104 mg/dL (ref 100–199)
HDL: 37 mg/dL — ABNORMAL LOW (ref >39)
LDL Chol Calc (NIH): 49 mg/dL (ref 0–99)
Triglycerides: 96 mg/dL (ref 0–149)
VLDL Cholesterol Cal: 18 mg/dL (ref 5–40)

## 2023-01-06 NOTE — Progress Notes (Signed)
Contacted via MyChart -- need lab only visit in 2 weeks please   Good afternoon Jaime [name], your labs have returned and overall remain stable with exception of potassium.  Potassium level is a little low, I recommend for this to increase potassium rich foods in diet like bananas, mangoes, dried fruit, and rich green leafy foods.  I would like to recheck on outpatient lab visit in 2 weeks, my staff will call to schedule.  Your hydrochlorothiazide can lower potassium levels, so be aware that sometimes you may need to add to diet to keep levels stable.  Any questions? Keep being amazing!!  Thank you for allowing me to participate in your care.  I appreciate you. Kindest regards, Bunny Lowdermilk

## 2023-01-07 NOTE — Progress Notes (Signed)
Called and scheduled patient for lab only visit on 01/20/2023 @ 8:20 am.

## 2023-01-20 ENCOUNTER — Other Ambulatory Visit: Payer: Self-pay | Admitting: Nurse Practitioner

## 2023-01-20 ENCOUNTER — Ambulatory Visit: Payer: BC Managed Care – PPO | Attending: Cardiology | Admitting: Cardiology

## 2023-01-20 ENCOUNTER — Encounter: Payer: Self-pay | Admitting: Cardiology

## 2023-01-20 ENCOUNTER — Other Ambulatory Visit: Payer: BC Managed Care – PPO

## 2023-01-20 VITALS — BP 138/80 | HR 53 | Ht 67.0 in | Wt 197.0 lb

## 2023-01-20 DIAGNOSIS — E7800 Pure hypercholesterolemia, unspecified: Secondary | ICD-10-CM

## 2023-01-20 DIAGNOSIS — E876 Hypokalemia: Secondary | ICD-10-CM

## 2023-01-20 DIAGNOSIS — I10 Essential (primary) hypertension: Secondary | ICD-10-CM

## 2023-01-20 DIAGNOSIS — I2510 Atherosclerotic heart disease of native coronary artery without angina pectoris: Secondary | ICD-10-CM | POA: Diagnosis not present

## 2023-01-20 NOTE — Patient Instructions (Signed)
Medication Instructions:   Your physician recommends that you continue on your current medications as directed. Please refer to the Current Medication list given to you today.  *If you need a refill on your cardiac medications before your next appointment, please call your pharmacy*   Lab Work:  None Ordered  If you have labs (blood work) drawn today and your tests are completely normal, you will receive your results only by: MyChart Message (if you have MyChart) OR A paper copy in the mail If you have any lab test that is abnormal or we need to change your treatment, we will call you to review the results.   Testing/Procedures:  None Ordered   Follow-Up: At [facility], you and your health needs are our priority.  As part of our continuing mission to provide you with exceptional heart care, we have created designated Provider Care Teams.  These Care Teams include your primary Cardiologist (physician) and Advanced Practice Providers (APPs -  Physician Assistants and Nurse Practitioners) who all work together to provide you with the care you need, when you need it.  We recommend signing up for the patient portal called "MyChart".  Sign up information is provided on this After Visit Summary.  MyChart is used to connect with patients for Virtual Visits (Telemedicine).  Patients are able to view lab/test results, encounter notes, upcoming appointments, etc.  Non-urgent messages can be sent to your provider as well.   To learn more about what you can do with MyChart, go to [email or web address].    Your next appointment:   6 month(s)  Provider:   You may see Brian Agbor-Etang, MD or one of the following Advanced Practice Providers on your designated Care Team:   Christopher Berge, NP Ryan Dunn, PA-C Cadence Furth, PA-C Sheri Hammock, NP 

## 2023-01-20 NOTE — Progress Notes (Signed)
Cardiology Office Note:    Date:  01/20/2023   ID:  Jaime [name], DOB July 23, 1961, MRN [id number]  PCP:  Jaime Skiff, NP   [facility] Providers Cardiologist:  Debbe Odea, MD     Referring MD: Jaime Skiff, NP   Chief Complaint  Patient presents with   Follow-up    3 Month f/u no complaints today. Meds reviewed verbally with pt.    History of Present Illness:    Jaime [name] is a 62 y.o. male with a hx of CAD s/p PCI to LAD in 2012, hypertension, hyperlipidemia, DVT who presents for follow-up.   Carvedilol stopped last visit due to bradycardia with heart rates in the 40s.  He states having an episode of chest pain after stopping carvedilol, these have since resolved.  Currently takes Ranexa for angina.  Compliant with medications as prescribed.  Hydralazine was started after stopping Coreg.  States feeling well, no concerns at this time.  Does not check blood pressure frequently at home.   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  He was treated with Coumadin for a year.  Diagnosed with COVID-19 04/27/2020, developed right lower extremity DVT.  Was started on Eliquis.  He was told he would likely be on Eliquis long-term.  He took Eliquis for about 30 days and then stop.  Did not refill.  Being seen by hematology.  Past Medical History:  Diagnosis Date   Allergy    Clotting disorder (HCC)    GERD (gastroesophageal reflux disease)    History of heart attack 2012   Hyperlipidemia    Hypertension    Kidney stones     Past Surgical History:  Procedure Laterality Date   CORONARY ANGIOPLASTY WITH STENT PLACEMENT  2012   EXTRACORPOREAL SHOCK WAVE LITHOTRIPSY Left 01/30/2022   Procedure: EXTRACORPOREAL SHOCK WAVE LITHOTRIPSY (ESWL);  Surgeon: Riki Altes, MD;  Location: [facility] ORS;  Service: Urology;  Laterality: Left;   REPLACEMENT TOTAL KNEE BILATERAL Bilateral    2017 left knee, 2019 right  knee   WRIST SURGERY Left    Carpel Tunnel    Current Medications: Current Meds  Medication Sig   amLODipine (NORVASC) 10 MG tablet Take 1 tablet (10 mg total) by mouth daily.   apixaban (ELIQUIS) 5 MG TABS tablet Take 1 tablet (5 mg total) by mouth 2 (two) times daily.   Desoximetasone 0.25 % LIQD Apply to scalp daily for 2 weeks and then only use as needed for itchy scalp (no more then twice weekly).   ezetimibe (ZETIA) 10 MG tablet Take 1 tablet (10 mg total) by mouth daily.   hydrALAZINE (APRESOLINE) 50 MG tablet Take 1 tablet (50 mg total) by mouth in the morning and at bedtime.   hydrochlorothiazide (HYDRODIURIL) 25 MG tablet Take 1 tablet (25 mg total) by mouth daily.   icosapent Ethyl (VASCEPA) 1 g capsule Take 2 capsules (2 g total) by mouth 2 (two) times daily.   ketoconazole (NIZORAL) 2 % shampoo Apply 1 Application topically 2 (two) times a week.   losartan (COZAAR) 100 MG tablet Take 1 tablet (100 mg total) by mouth daily.   montelukast (SINGULAIR) 10 MG tablet Take 1 tablet (10 mg total) by mouth daily.   nitroGLYCERIN (NITROSTAT) 0.4 MG SL tablet Place 1 tablet (0.4 mg total) under the tongue every 5 (five) minutes as needed for chest pain. For a maximum of 3 doses in  24 hours.   pantoprazole (PROTONIX) 40 MG tablet Take 1 tablet (40 mg total) by mouth in the morning and at bedtime. Need appt for further refills   ranolazine (RANEXA) 1000 MG SR tablet Take 1 tablet (1,000 mg total) by mouth 2 (two) times daily.   rosuvastatin (CRESTOR) 40 MG tablet Take 1 tablet (40 mg total) by mouth daily.   tadalafil (CIALIS) 20 MG tablet Take 1 tablet (20 mg total) by mouth daily as needed for erectile dysfunction.     Allergies:   Codeine   Social History   Socioeconomic History   Marital status: Married    Spouse name: Jaime [name]   Number of children: Not on file   Years of education: Not on file   Highest education level: 12th grade  Occupational History   Not on file  Tobacco  Use   Smoking status: Never   Smokeless tobacco: Never  Vaping Use   Vaping Use: Never used  Substance and Sexual Activity   Alcohol use: Yes    Comment: RARE- 1 to 2 a month   Drug use: Never   Sexual activity: Yes    Birth control/protection: None  Other Topics Concern   Not on file  Social History Narrative   Not on file   Social Determinants of Health   Financial Resource Strain: Not on file  Food Insecurity: No Food Insecurity (11/30/2022)   Hunger Vital Sign    Worried About Running Out of Food in the Last Year: Never true    Ran Out of Food in the Last Year: Never true  Transportation Needs: No Transportation Needs (11/30/2022)   PRAPARE - Administrator, Civil Service (Medical): No    Lack of Transportation (Non-Medical): No  Physical Activity: Insufficiently Active (11/30/2022)   Exercise Vital Sign    Days of Exercise per Week: 3 days    Minutes of Exercise per Session: 10 min  Stress: No Stress Concern Present (11/30/2022)   [employer] of Occupational Health - Occupational Stress Questionnaire    Feeling of Stress : Only a little  Social Connections: Unknown (11/30/2022)   Social Connection and Isolation Panel [NHANES]    Frequency of Communication with Friends and Family: Not on file    Frequency of Social Gatherings with Friends and Family: More than three times a week    Attends Religious Services: More than 4 times per year    Active Member of [employer] or Organizations: Not on file    Attends Banker Meetings: Not on file    Marital Status: Married     Family History: The patient's family history includes Cancer in his father; Heart attack in his paternal aunt; Pancreatic cancer in his mother.  ROS:   Please see the history of present illness.     All other systems reviewed and are negative.  EKGs/Labs/Other Studies Reviewed:    The following studies were reviewed today:   EKG:  EKG is  ordered today.  The ekg ordered today  demonstrates sinus bradycardia, otherwise normal ECG.  Recent Labs: 07/04/2022: Magnesium 2.1 08/04/2022: TSH 0.622 11/13/2022: Hemoglobin 15.4; Platelets 194 01/05/2023: ALT 32; BUN 22; Creatinine, Ser 1.23; Potassium 3.3; Sodium 141  Recent Lipid Panel    Component Value Date/Time   CHOL 104 01/05/2023 0820   TRIG 96 01/05/2023 0820   HDL 37 (L) 01/05/2023 0820   CHOLHDL 2.9 07/22/2021 0740   LDLCALC 49 01/05/2023 0820     Risk Assessment/Calculations:  Physical Exam:    VS:  BP 138/80 (BP Location: Left Arm, Patient Position: Sitting, Cuff Size: Normal)   Pulse (!) 53   Ht 5\' 7"  (1.702 m)   Wt 197 lb (89.4 kg)   SpO2 97%   BMI 30.85 kg/m     Wt Readings from Last 3 Encounters:  01/20/23 197 lb (89.4 kg)  01/05/23 194 lb 6.4 oz (88.2 kg)  11/19/22 201 lb 4.8 oz (91.3 kg)     GEN:  Well nourished, well developed in mild to moderate distress HEENT: Normal NECK: No JVD; No carotid bruits CARDIAC: Regular, bradycardic. RESPIRATORY:  Clear to auscultation without rales, wheezing or rhonchi  ABDOMEN: Soft, non-tender, non-distended MUSCULOSKELETAL:  No edema; No deformity  SKIN: Warm and dry NEUROLOGIC:  Alert and oriented x 3 PSYCHIATRIC:  Normal affect   ASSESSMENT:    1. Coronary artery disease involving native heart without angina pectoris, unspecified vessel or lesion type   2. Primary hypertension   3. Pure hypercholesterolemia    PLAN:    In order of problems listed above:  CAD s/p PCI to LAD 2012.  1 episode of chest pain with stopping Coreg, this has since resolved.  Echo 09/2021 EF 55 to 60%..  Continue aspirin, Crestor, Ranexa. Hypertension, BP controlled., bradycardic, heart rate 53 overall improved since stopping beta-blocker/carvedilol.  Continue hydralazine 50 mg twice daily,  Continue Norvasc, losartan, hydrochlorothiazide 25 mg daily.  Consider titrating hydralazine if BP stays elevated. Hyperlipidemia, cholesterol controlled.  Continue  Zetia, Crestor, Vascepa.  Follow-up in 6 months.      Medication Adjustments/Labs and Tests Ordered: Current medicines are reviewed at length with the patient today.  Concerns regarding medicines are outlined above.  Orders Placed This Encounter  Procedures   EKG 12-Lead    No orders of the defined types were placed in this encounter.    Patient Instructions  Medication Instructions:   Your physician recommends that you continue on your current medications as directed. Please refer to the Current Medication list given to you today.   *If you need a refill on your cardiac medications before your next appointment, please call your pharmacy*   Lab Work:  None Ordered  If you have labs (blood work) drawn today and your tests are completely normal, you will receive your results only by: MyChart Message (if you have MyChart) OR A paper copy in the mail If you have any lab test that is abnormal or we need to change your treatment, we will call you to review the results.   Testing/Procedures:  None Ordered   Follow-Up: At [facility], you and your health needs are our priority.  As part of our continuing mission to provide you with exceptional heart care, we have created designated Provider Care Teams.  These Care Teams include your primary Cardiologist (physician) and Advanced Practice Providers (APPs -  Physician Assistants and Nurse Practitioners) who all work together to provide you with the care you need, when you need it.  We recommend signing up for the patient portal called "MyChart".  Sign up information is provided on this After Visit Summary.  MyChart is used to connect with patients for Virtual Visits (Telemedicine).  Patients are able to view lab/test results, encounter notes, upcoming appointments, etc.  Non-urgent messages can be sent to your provider as well.   To learn more about what you can do with MyChart, go to [email or web address].    Your  next appointment:   6 month(s)  Provider:   You may see Debbe Odea, MD or one of the following Advanced Practice Providers on your designated Care Team:   Nicolasa Ducking, NP Eula Listen, PA-C Cadence Fransico Michael, PA-C Charlsie Quest, NP   Signed, Debbe Odea, MD  01/20/2023 10:00 AM    [facility]

## 2023-01-21 LAB — BASIC METABOLIC PANEL
BUN/Creatinine Ratio: 14 (ref 10–24)
BUN: 15 mg/dL (ref 8–27)
CO2: 24 mmol/L (ref 20–29)
Calcium: 9.3 mg/dL (ref 8.6–10.2)
Chloride: 103 mmol/L (ref 96–106)
Creatinine, Ser: 1.09 mg/dL (ref 0.76–1.27)
Glucose: 110 mg/dL — ABNORMAL HIGH (ref 70–99)
Potassium: 4.1 mmol/L (ref 3.5–5.2)
Sodium: 141 mmol/L (ref 134–144)
eGFR: 78 mL/min/{1.73_m2} (ref >59)

## 2023-01-21 NOTE — Progress Notes (Signed)
Contacted via MyChart   Good morning Jaime [name], your labs have returned and potassium level has improved.  Continue focus on getting good potassium in diet daily.  Any questions?

## 2023-01-22 IMAGING — CR DG ABDOMEN 1V
1 series · 2 of 2 positions shown · non-contrast
Comparison: CT abdomen pelvis 12/18/2021

CLINICAL DATA: History of left-sided renal stone.

EXAM:
ABDOMEN - 1 VIEW

[Series 1: dg abd 1 view · 0.14mm/px · 2 of 2 slices shown]
[im 1/2]
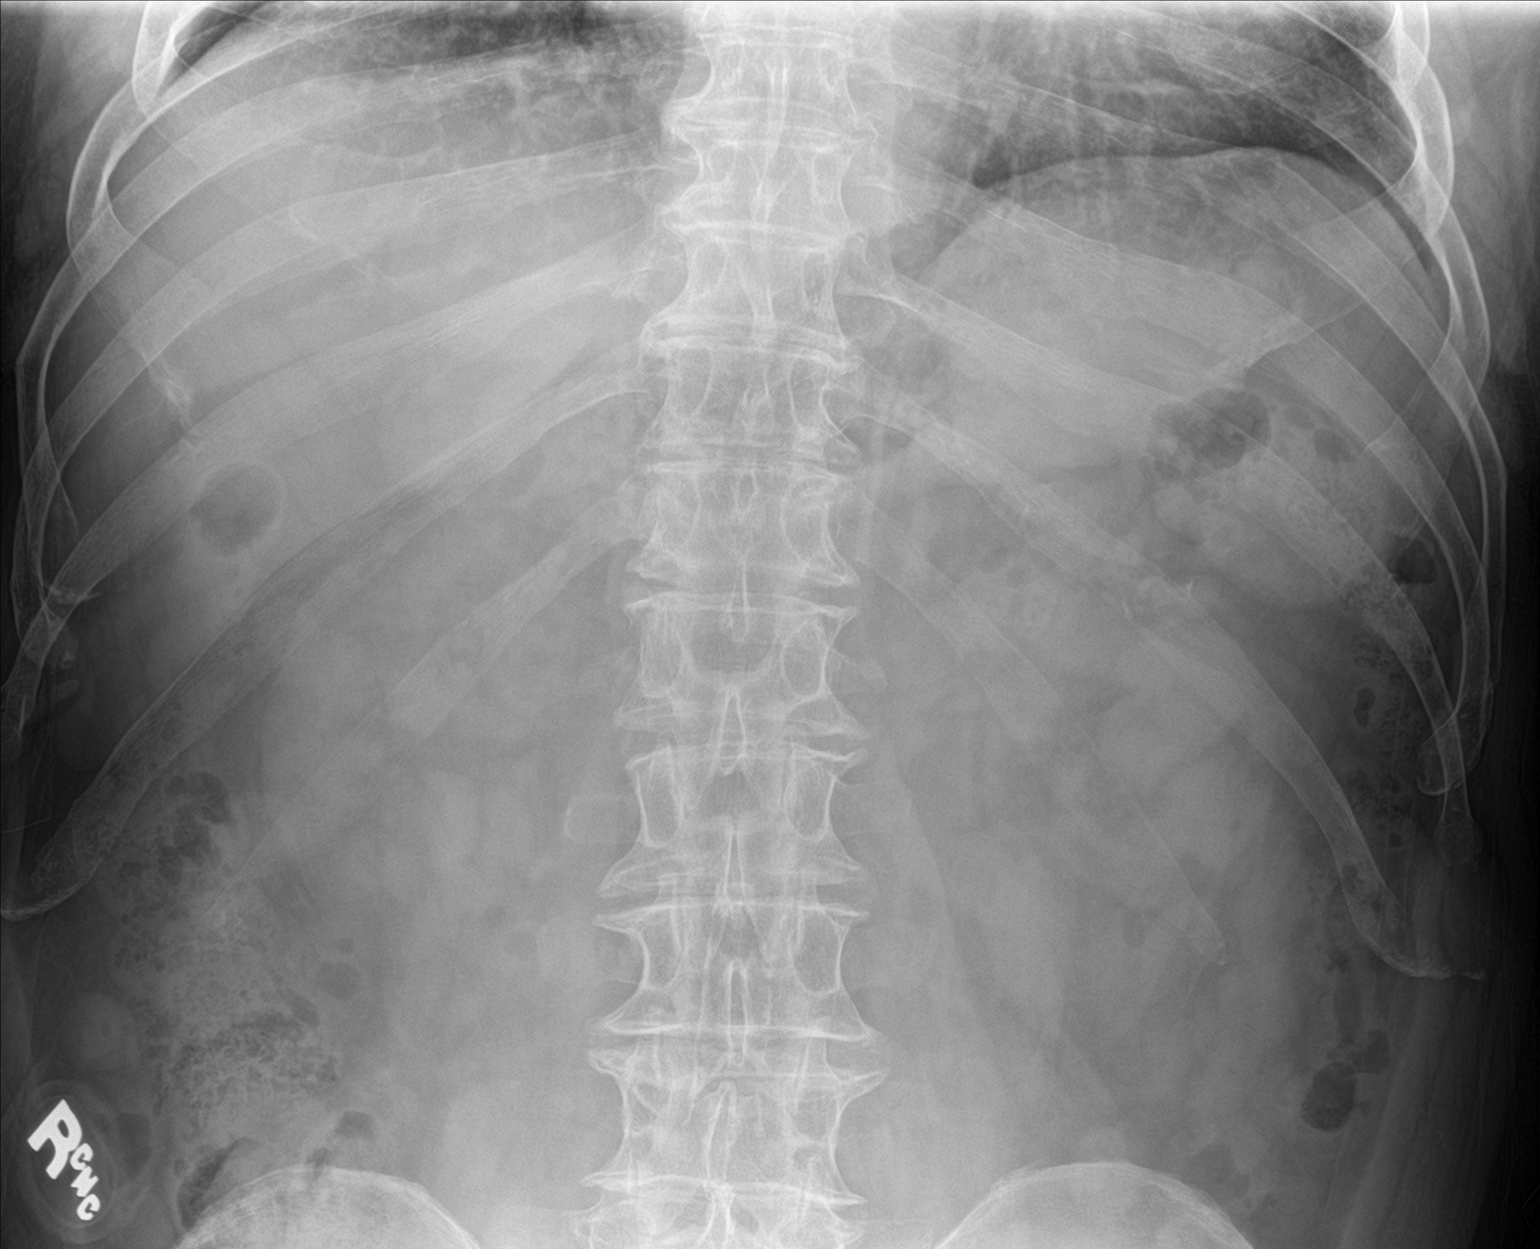
[im 2/2]
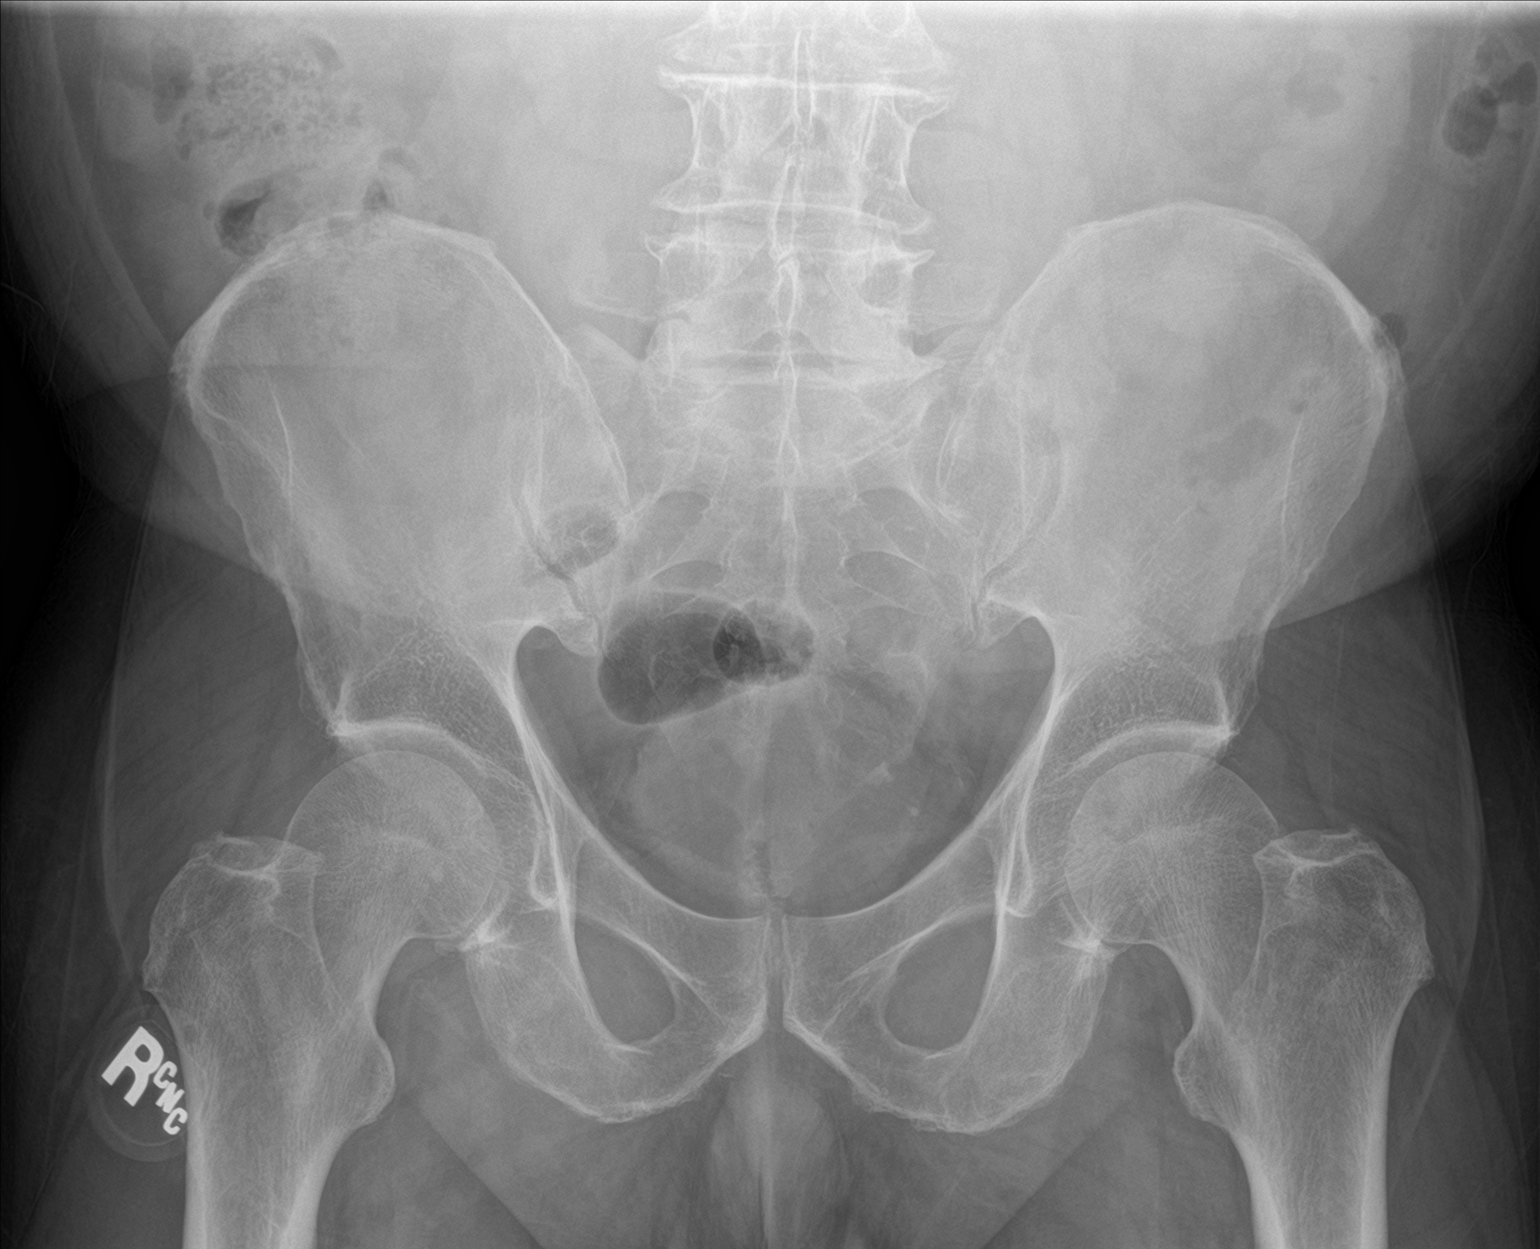

[2 of 2 positions shown; findings below may reference images not displayed]

FINDINGS: Lung bases are clear. Nonobstructive pattern. Possible radiodensity
projecting over the left hemipelvis lumbar spine degenerative
changes.
IMPRESSION: Possible small radiodensity projecting over the left hemipelvis
which may represent the previously visualized distal left ureteral
stone.

## 2023-03-12 IMAGING — CR DG ABDOMEN 1V
1 series · 1 of 1 positions shown · non-contrast
Comparison: [DATE] [DATE], [DATE], [DATE] [DATE], [DATE], CT abdomen pelvis December 18, 2021

CLINICAL DATA: Kidney stones.

EXAM:
ABDOMEN - 1 VIEW

[dg abd 1 view]
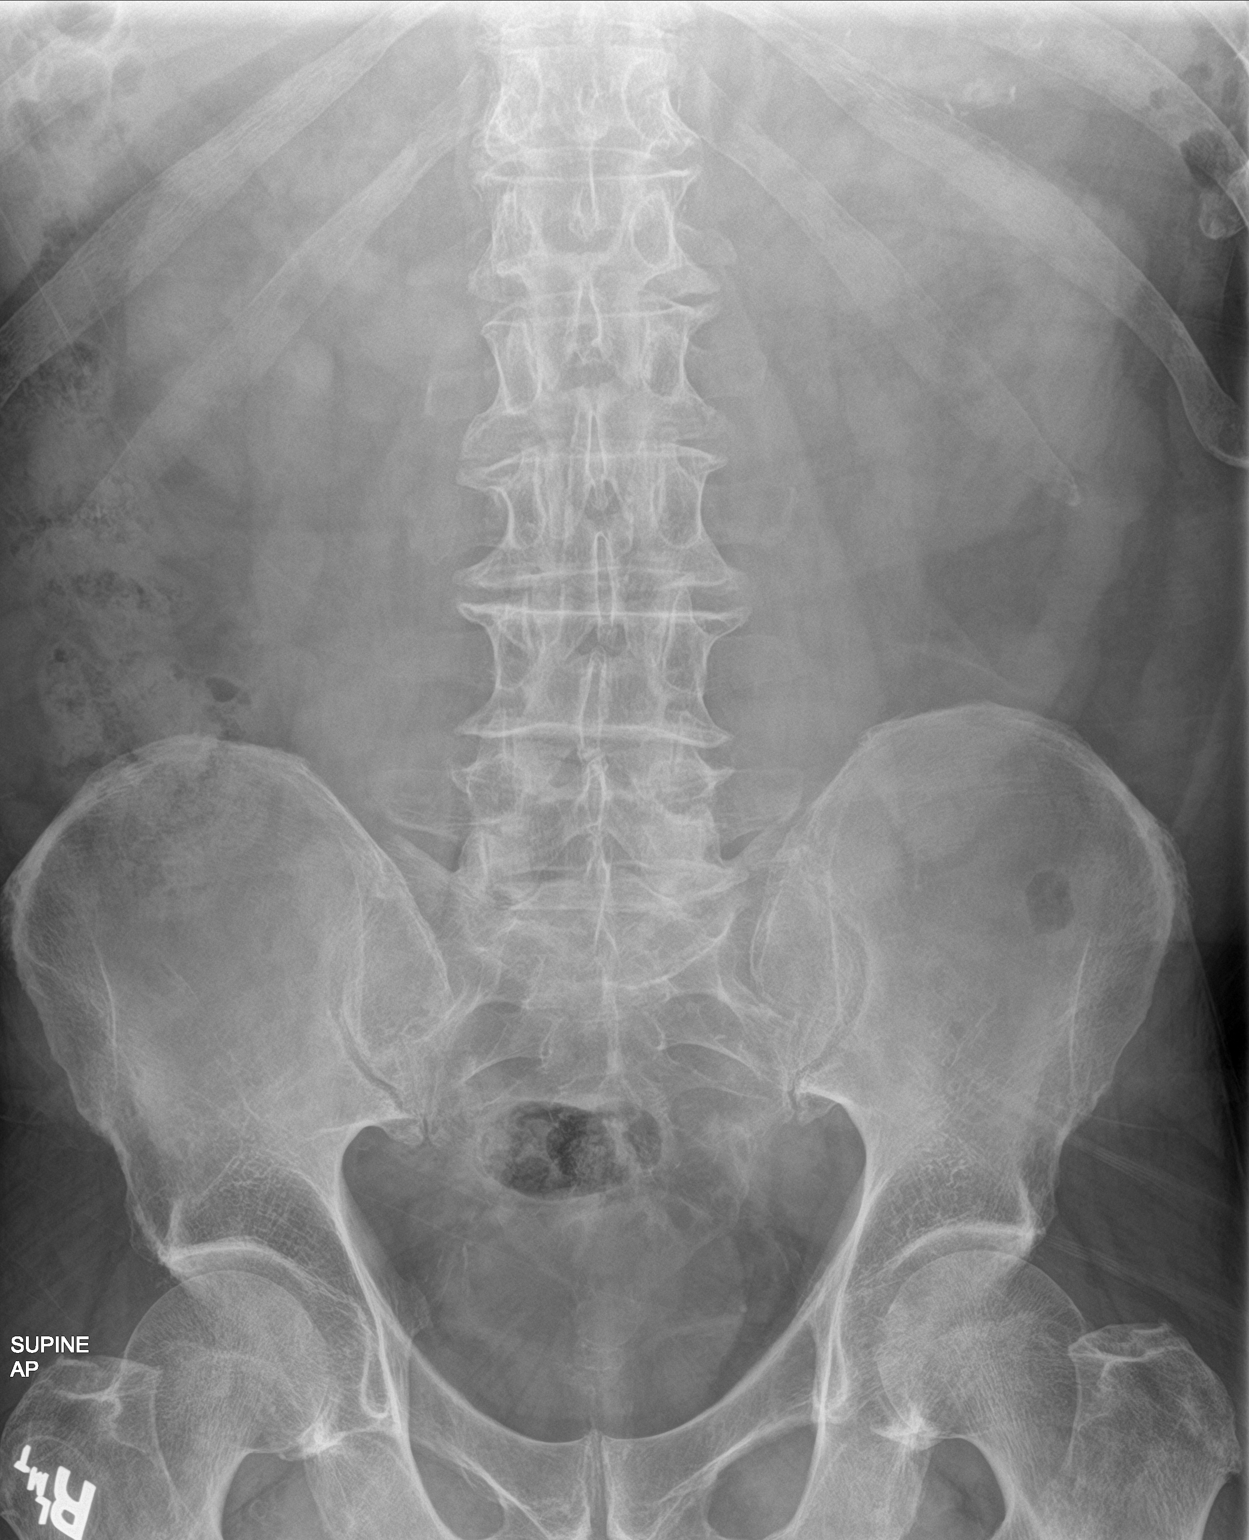

[1 of 1 positions shown; findings below may reference images not displayed]

FINDINGS: The bowel gas pattern is normal. Previously noted faint
calcification in the left ureteral vesicular junction is not seen on
current exam. Left pelvic phleboliths is unchanged compared to prior
exams.
IMPRESSION: Previously noted faint calcification in the left ureteral vesicular
junction is not seen on current exam.

## 2023-05-17 NOTE — Patient Instructions (Signed)
Be Involved in Caring For Your Health:  Taking Medications When medications are taken as directed, they can greatly improve your health. But if they are not taken as prescribed, they may not work. In some cases, not taking them correctly can be harmful. To help ensure your treatment remains effective and safe, understand your medications and how to take them. Bring your medications to each visit for review by your provider.  Your lab results, notes, and after visit summary will be available on My Chart. We strongly encourage you to use this feature. If lab results are abnormal the clinic will contact you with the appropriate steps. If the clinic does not contact you assume the results are satisfactory. You can always view your results on My Chart. If you have questions regarding your health or results, please contact the clinic during office hours. You can also ask questions on My Chart.  We at [facility] are grateful that you chose [place] to provide your care. We strive to provide evidence-based and compassionate care and are always looking for feedback. If you get a survey from the clinic please complete this so we can hear your opinions.  Hand Pain Hand pain can make it hard to do daily activities. Many things can cause hand pain. Some common causes are: Injuries. These may include: Broken bones (fractures)and cuts. Overuse injuries from doing the same movements many times (repetitive activity). Arthritis. Lumps in the tendons or joints of the hand and wrist (ganglion cysts). Nerve compression syndromes (carpal tunnel syndrome). Inflammation of the tendons (tendinitis). Infection. Follow these instructions at home: Managing pain, stiffness, and swelling     Take over-the-counter and prescription medicines only as told by your health care provider. If told, put ice on the affected area. Put ice in a plastic bag. Place a towel between your skin and the bag. Leave the ice on for  20 minutes, 2-3 times a day. If told, apply heat to the affected area before you exercise or as often as told by your provider. Use the heat source that your provider recommends, such as a moist heat pack or a heating pad. Place a towel between your skin and the heat source. Leave the heat on for 20-30 minutes. If your skin turns bright red, remove the ice or heat right away to prevent skin damage. The risk of damage is higher if you cannot feel pain, heat, or cold. Activity Take breaks from repetitive activity often. Minimize stress on your hands and wrists as much as possible. Do stretches or exercises as told by your provider. Do not do activities that make your pain worse. Wear a hand splint or support as told by your provider. Contact a health care provider if: Your pain does not get better after a few days. Your pain gets worse. Your pain affects your ability to do your daily activities. Your hand becomes warm, red, or swollen. Your hand is numb or tingling. Get help right away if: Your hand is extremely swollen or is an unusual shape. Your hand or fingers turn white or blue. You cannot move your hand, wrist, or fingers. This information is not intended to replace advice given to you by your health care provider. Make sure you discuss any questions you have with your health care provider. Document Revised: 04/02/2022 Document Reviewed: 04/02/2022 Elsevier Patient Education  2024 [employer].

## 2023-05-18 ENCOUNTER — Ambulatory Visit: Payer: BC Managed Care – PPO | Admitting: Nurse Practitioner

## 2023-05-18 ENCOUNTER — Encounter: Payer: Self-pay | Admitting: Nurse Practitioner

## 2023-05-18 VITALS — BP 111/68 | HR 61 | Temp 98.1°F | Resp 18 | Ht 66.0 in | Wt 200.2 lb

## 2023-05-18 DIAGNOSIS — G5602 Carpal tunnel syndrome, left upper limb: Secondary | ICD-10-CM | POA: Diagnosis not present

## 2023-05-18 DIAGNOSIS — K429 Umbilical hernia without obstruction or gangrene: Secondary | ICD-10-CM | POA: Diagnosis not present

## 2023-05-18 DIAGNOSIS — T753XXA Motion sickness, initial encounter: Secondary | ICD-10-CM | POA: Diagnosis not present

## 2023-05-18 DIAGNOSIS — Z8719 Personal history of other diseases of the digestive system: Secondary | ICD-10-CM | POA: Insufficient documentation

## 2023-05-18 DIAGNOSIS — K420 Umbilical hernia with obstruction, without gangrene: Secondary | ICD-10-CM | POA: Insufficient documentation

## 2023-05-18 MED ORDER — SCOPOLAMINE 1 MG/3DAYS TD PT72: 1.0000 | MEDICATED_PATCH | TRANSDERMAL | 0 refills | Status: DC

## 2023-05-18 NOTE — Assessment & Plan Note (Signed)
Ongoing, but he reports getting bigger and is tender.  Will place referral to general surgery for further recommendations.  Recommend to wear abdominal binder while up and moving/lifting.

## 2023-05-18 NOTE — Progress Notes (Signed)
BP 111/68   Pulse 61   Temp 98.1 F (36.7 C) (Oral)   Resp 18   Ht 5\' 6"  (1.676 m)   Wt 200 lb 3.2 oz (90.8 kg)   SpO2 98%   BMI 32.31 kg/m    Subjective:    Patient ID: Jaime [name], male    DOB: 10-Aug-1961, 62 y.o.   MRN: [id number]  HPI: Jaime [name] is a 62 y.o. male  Chief Complaint  Patient presents with   Carpal Tunnel    Pt states he wants to discuss carpal tunnel in his L hand. States he was supposed to have surgery but didn't. States he is having issues with pain in his hand   Naval Swelling    Pt states he has been noticing swelling in his naval off and on for the past month. States it has been "poking out" and then will go back in. States it is sore when pressed on.    Medication Refill    Patient requesting something for motion sickness as he will be traveling next month    Is traveling soon and would like something for nausea.  HAND PAIN (LEFT) Has left hand pain ongoing, was to have surgery for carpal tunnel to this hand in 2020.  Had right hand surgery in 2019.  Pain has been worsening over past year. He is right dominant. Duration: months Involved hand: left Mechanism of injury: unknown Location: palmar into fingers Onset: gradual Severity: 10/10  Quality: sharp, burning, and throbbing Frequency: intermittent -- getting more frequent Radiation: into thumb to third finger Aggravating factors: when using hands a lot Alleviating factors: nothing Treatments attempted: resting Relief with NSAIDs?: No NSAIDs Taken Weakness: no Numbness: yes Redness: no Swelling:no Bruising: no Fevers: no   HERNIA Has noticed this for awhile, but usually goes down and has not gone down.  When presses on it gets sore. Duration: months Location: naval area Painful: yes intermittent -- stretching and touching it Discomfort: yes Bulge: yes Quality:  dull, aching, and throbbing Onset: gradual Severity: 8/10 Context: bigger Aggravating factors: lifting and  coughing   Relevant past medical, surgical, family and social history reviewed and updated as indicated. Interim medical history since our last visit reviewed. Allergies and medications reviewed and updated.  Review of Systems  Constitutional:  Negative for activity change, appetite change, diaphoresis, fatigue and fever.  Respiratory:  Negative for cough, chest tightness, shortness of breath and wheezing.   Cardiovascular:  Negative for chest pain, palpitations and leg swelling.  Gastrointestinal:  Positive for abdominal pain (to umbilical area). Negative for abdominal distention, constipation, diarrhea, nausea and vomiting.  Musculoskeletal:  Positive for arthralgias.  Neurological: Negative.   Psychiatric/Behavioral: Negative.      Per HPI unless specifically indicated above     Objective:    BP 111/68   Pulse 61   Temp 98.1 F (36.7 C) (Oral)   Resp 18   Ht 5\' 6"  (1.676 m)   Wt 200 lb 3.2 oz (90.8 kg)   SpO2 98%   BMI 32.31 kg/m   Wt Readings from Last 3 Encounters:  05/18/23 200 lb 3.2 oz (90.8 kg)  01/20/23 197 lb (89.4 kg)  01/05/23 194 lb 6.4 oz (88.2 kg)    Physical Exam Vitals and nursing note reviewed.  Constitutional:      General: He is awake. He is not in acute distress.    Appearance: He is well-developed and well-groomed. He is obese. He is not ill-appearing or  toxic-appearing.  HENT:     Head: Normocephalic.     Right Ear: Hearing and external ear normal.     Left Ear: Hearing and external ear normal.  Eyes:     General: Lids are normal.     Extraocular Movements: Extraocular movements intact.     Conjunctiva/sclera: Conjunctivae normal.  Neck:     Thyroid: No thyromegaly.     Vascular: No carotid bruit.  Cardiovascular:     Rate and Rhythm: Normal rate and regular rhythm.     Heart sounds: Normal heart sounds.  Pulmonary:     Effort: No accessory muscle usage or respiratory distress.     Breath sounds: Normal breath sounds.  Abdominal:      General: Bowel sounds are normal. There is no distension.     Palpations: Abdomen is soft.     Tenderness: There is abdominal tenderness (to area of hernia on palpation) in the periumbilical area.     Hernia: A hernia is present. Hernia is present in the umbilical area.     Comments: Mild erythema to umbilical area.  Hernia palpated.  Musculoskeletal:     Right wrist: Normal.     Left wrist: Tenderness present. No swelling, bony tenderness or crepitus. Normal range of motion. Normal pulse.     Right hand: Normal.     Left hand: No swelling or tenderness. Normal range of motion. Normal strength. Decreased sensation of the radial distribution. Normal capillary refill.     Cervical back: Full passive range of motion without pain.     Right lower leg: No edema.     Left lower leg: No edema.     Comments: Positive Phalen to left on exam.  Tinel performed no changes noted with this.  Lymphadenopathy:     Cervical: No cervical adenopathy.  Skin:    General: Skin is warm.     Capillary Refill: Capillary refill takes less than 2 seconds.  Neurological:     Mental Status: He is alert and oriented to person, place, and time.     Deep Tendon Reflexes: Reflexes are normal and symmetric.     Reflex Scores:      Brachioradialis reflexes are 2+ on the right side and 2+ on the left side.      Patellar reflexes are 2+ on the right side and 2+ on the left side. Psychiatric:        Attention and Perception: Attention normal.        Mood and Affect: Mood normal.        Speech: Speech normal.        Behavior: Behavior normal. Behavior is cooperative.        Thought Content: Thought content normal.     Results for orders placed or performed in visit on 01/20/23  Basic Metabolic Panel (BMET)  Result Value Ref Range   Glucose 110 (H) 70 - 99 mg/dL   BUN 15 8 - 27 mg/dL   Creatinine, Ser 1.61 0.76 - 1.27 mg/dL   eGFR 78 >09 UE/AVW/0.98   BUN/Creatinine Ratio 14 10 - 24   Sodium 141 134 - 144 mmol/L    Potassium 4.1 3.5 - 5.2 mmol/L   Chloride 103 96 - 106 mmol/L   CO2 24 20 - 29 mmol/L   Calcium 9.3 8.6 - 10.2 mg/dL      Assessment & Plan:   Problem List Items Addressed This Visit       Nervous and Auditory  Carpal tunnel syndrome on left - Primary    Ongoing for over one year, was supposed to have surgery in 2020 but moved here and had life stressors at time.  Will place referral to hand specialist to further assess for possible surgical intervention.      Relevant Orders   Ambulatory referral to Hand Surgery     Other   Umbilical hernia without obstruction and without gangrene    Ongoing, but he reports getting bigger and is tender.  Will place referral to general surgery for further recommendations.  Recommend to wear abdominal binder while up and moving/lifting.      Relevant Orders   Ambulatory referral to General Surgery   Other Visit Diagnoses     Motion sickness, initial encounter       [place] scopolamine [place]        Follow up plan: Return for reschedule his October 29th visit, late November -- chronic disease visit.

## 2023-05-18 NOTE — Assessment & Plan Note (Signed)
Ongoing for over one year, was supposed to have surgery in 2020 but moved here and had life stressors at time.  Will place referral to hand specialist to further assess for possible surgical intervention.

## 2023-05-27 ENCOUNTER — Telehealth: Payer: Self-pay | Admitting: Cardiology

## 2023-05-27 ENCOUNTER — Ambulatory Visit: Payer: BC Managed Care – PPO | Admitting: Surgery

## 2023-05-27 ENCOUNTER — Telehealth: Payer: Self-pay | Admitting: *Deleted

## 2023-05-27 ENCOUNTER — Encounter: Payer: Self-pay | Admitting: Surgery

## 2023-05-27 ENCOUNTER — Telehealth: Payer: Self-pay

## 2023-05-27 VITALS — BP 151/88 | HR 64 | Temp 98.2°F | Ht 67.0 in | Wt 201.4 lb

## 2023-05-27 DIAGNOSIS — K4090 Unilateral inguinal hernia, without obstruction or gangrene, not specified as recurrent: Secondary | ICD-10-CM | POA: Diagnosis not present

## 2023-05-27 DIAGNOSIS — K420 Umbilical hernia with obstruction, without gangrene: Secondary | ICD-10-CM

## 2023-05-27 NOTE — Telephone Encounter (Signed)
  Patient Consent for Virtual Visit   Jaime [name] has provided verbal consent on 05/27/2023 for a virtual visit (video or telephone).   CONSENT FOR VIRTUAL VISIT FOR:  Jaime [name]  By participating in this virtual visit I agree to the following:  I hereby voluntarily request, consent and authorize [facility] and its employed or contracted physicians, physician assistants, nurse practitioners or other licensed health care professionals (the Practitioner), to provide me with telemedicine health care services (the "Services") as deemed necessary by the treating Practitioner. I acknowledge and consent to receive the Services by the Practitioner via telemedicine. I understand that the telemedicine visit will involve communicating with the Practitioner through live audiovisual communication technology and the disclosure of certain medical information by electronic transmission. I acknowledge that I have been given the opportunity to request an in-person assessment or other available alternative prior to the telemedicine visit and am voluntarily participating in the telemedicine visit.  I understand that I have the right to withhold or withdraw my consent to the use of telemedicine in the course of my care at any time, without affecting my right to future care or treatment, and that the Practitioner or I may terminate the telemedicine visit at any time. I understand that I have the right to inspect all information obtained and/or recorded in the course of the telemedicine visit and may receive copies of available information for a reasonable fee.  I understand that some of the potential risks of receiving the Services via telemedicine include:  Delay or interruption in medical evaluation due to technological equipment failure or disruption; Information transmitted may not be sufficient (e.g. poor resolution of images) to allow for appropriate medical decision making by the Practitioner; and/or   In rare instances, security protocols could fail, causing a breach of personal health information.  Furthermore, I acknowledge that it is my responsibility to provide information about my medical history, conditions and care that is complete and accurate to the best of my ability. I acknowledge that Practitioner's advice, recommendations, and/or decision may be based on factors not within their control, such as incomplete or inaccurate data provided by me or distortions of diagnostic images or specimens that may result from electronic transmissions. I understand that the practice of medicine is not an exact science and that Practitioner makes no warranties or guarantees regarding treatment outcomes. I acknowledge that a copy of this consent can be made available to me via my patient portal [facility] MyChart), or I can request a printed copy by calling the office of [facility].    I understand that my insurance will be billed for this visit.   I have read or had this consent read to me. I understand the contents of this consent, which adequately explains the benefits and risks of the Services being provided via telemedicine.  I have been provided ample opportunity to ask questions regarding this consent and the Services and have had my questions answered to my satisfaction. I give my informed consent for the services to be provided through the use of telemedicine in my medical care

## 2023-05-27 NOTE — Telephone Encounter (Signed)
Faxed medical clearance to Aura Dials, NP at [phone].

## 2023-05-27 NOTE — Telephone Encounter (Signed)
Patient schedule 06-11-23

## 2023-05-27 NOTE — Telephone Encounter (Signed)
Name: Josen Amenta  DOB: November 05, 1960  MRN: [id number]  Primary Cardiologist: Debbe Odea, MD  Chart reviewed as part of pre-operative protocol coverage. Because of Sebastion Stouffer's past medical history and time since last visit, he will require a follow-up telephone visit in order to better assess preoperative cardiovascular risk.  Pre-op covering staff: - Please schedule appointment and call patient to inform them. If patient already had an upcoming appointment within acceptable timeframe, please add "pre-op clearance" to the appointment notes so provider is aware. - Please contact requesting surgeon's office via preferred method (i.e, phone, fax) to inform them of need for appointment prior to surgery.  No medications indicated as needing held.We do not prescribe his Eliquis.  Sharlene Dory, PA-C  05/27/2023, 1:59 PM

## 2023-05-27 NOTE — Patient Instructions (Addendum)
You have requested for your Umbilical Hernia be repaired. This will be scheduled with Dr. Aleen [name] at [facility].   Please see your (blue)pre-care sheet for information. Our surgery scheduler will call you to verify surgery date and to go over information.   You will need to arrange to be off work for 1-2 weeks but will have to have a lifting restriction of no more than 15 lbs for 6 weeks following your surgery. If you have FMLA or disability paperwork that needs filled out you may drop this off at our office or this can be faxed to [phone].     Umbilical Hernia, Adult A hernia is a bulge of tissue that pushes through an opening between muscles. An umbilical hernia happens in the abdomen, near the belly button (umbilicus). The hernia may contain tissues from the small intestine, large intestine, or fatty tissue covering the intestines (omentum). Umbilical hernias in adults tend to get worse over time, and they require surgical treatment. There are several types of umbilical hernias. You may have: A hernia located just above or below the umbilicus (indirect hernia). This is the most common type of umbilical hernia in adults. A hernia that forms through an opening formed by the umbilicus (direct hernia). A hernia that comes and goes (reducible hernia). A reducible hernia may be visible only when you strain, lift something heavy, or cough. This type of hernia can be pushed back into the abdomen (reduced). A hernia that traps abdominal tissue inside the hernia (incarcerated hernia). This type of hernia cannot be reduced. A hernia that cuts off blood flow to the tissues inside the hernia (strangulated hernia). The tissues can start to die if this happens. This type of hernia requires emergency treatment.  What are the causes? An umbilical hernia happens when tissue inside the abdomen presses on a weak area of the abdominal muscles. What increases the risk? You may have a  greater risk of this condition if you: Are obese. Have had several pregnancies. Have a buildup of fluid inside your abdomen (ascites). Have had surgery that weakens the abdominal muscles.  What are the signs or symptoms? The main symptom of this condition is a painless bulge at or near the belly button. A reducible hernia may be visible only when you strain, lift something heavy, or cough. Other symptoms may include: Dull pain. A feeling of pressure.  Symptoms of a strangulated hernia may include: Pain that gets increasingly worse. Nausea and vomiting. Pain when pressing on the hernia. Skin over the hernia becoming red or purple. Constipation. Blood in the stool.  How is this diagnosed? This condition may be diagnosed based on: A physical exam. You may be asked to cough or strain while standing. These actions increase the pressure inside your abdomen and force the hernia through the opening in your muscles. Your health care provider may try to reduce the hernia by pressing on it. Your symptoms and medical history.  How is this treated? Surgery is the only treatment for an umbilical hernia. Surgery for a strangulated hernia is done as soon as possible. If you have a small hernia that is not incarcerated, you may need to lose weight before having surgery. Follow these instructions at home: Lose weight, if told by your health care provider. Do not try to push the hernia back in. Watch your hernia for any changes in color or size. Tell your health care provider if any changes occur. You may need to avoid activities  that increase pressure on your hernia. Do not lift anything that is heavier than 10 lb (4.5 kg) until your health care provider says that this is safe. Take over-the-counter and prescription medicines only as told by your health care provider. Keep all follow-up visits as told by your health care provider. This is important. Contact a health care provider if: Your hernia  gets larger. Your hernia becomes painful. Get help right away if: You develop sudden, severe pain near the area of your hernia. You have pain as well as nausea or vomiting. You have pain and the skin over your hernia changes color. You develop a fever. This information is not intended to replace advice given to you by your health care provider. Make sure you discuss any questions you have with your health care provider. Document Released: 01/25/2016 Document Revised: 04/27/2016 Document Reviewed: 01/25/2016 Elsevier Interactive Patient Education  [employer].

## 2023-05-27 NOTE — Telephone Encounter (Signed)
Lvm for patient to call back and schedule appointment

## 2023-05-27 NOTE — Progress Notes (Signed)
05/27/2023  Reason for Visit:  Umbilical hernia  Requesting Provider:  Aura Dials, NP  History of Present Illness: Jaime [name] is a 62 y.o. male presenting for evaluation of an umbilical hernia.  The patient reports that he's had this hernia for about 4 years.  He reports that initially it was overall asymptomatic and easily reducible.  However, more recently, he's noticed that the hernia bulging is further out, now flush with the rest of his skin, and is more constantly bulging out rather than push in.  It is also somewhat tender/uncomfortable when trying to push on it.  Heavy/strenuous activity aggravates it.  He's changed some of his duties at work so he does not have to do as much strenuous activity.  Sometimes reports some nausea, but otherwise denies any GI symptoms.  The pain is mostly around the umbilicus and does not radiate.  Of note he also notes sometimes discomfort in the low pelvis/groin area.  Of note he had a CT scan in April 2023 which does show a fat containing umbilical hernia.  This was done for evaluation of left kidney stone.  Past Medical History: Past Medical History:  Diagnosis Date   Allergy    Clotting disorder (HCC)    GERD (gastroesophageal reflux disease)    History of heart attack 2012   Hyperlipidemia    Hypertension    Kidney stones      Past Surgical History: Past Surgical History:  Procedure Laterality Date   CORONARY ANGIOPLASTY WITH STENT PLACEMENT  2012   EXTRACORPOREAL SHOCK WAVE LITHOTRIPSY Left 01/30/2022   Procedure: EXTRACORPOREAL SHOCK WAVE LITHOTRIPSY (ESWL);  Surgeon: Riki Altes, MD;  Location: [facility] ORS;  Service: Urology;  Laterality: Left;   REPLACEMENT TOTAL KNEE BILATERAL Bilateral    2017 left knee, 2019 right knee   WRIST SURGERY Left    Carpel Tunnel    Home Medications: Prior to Admission medications   Medication Sig Start Date End Date Taking? Authorizing Provider  amLODipine (NORVASC) 10 MG tablet Take 1 tablet  (10 mg total) by mouth daily. 02/06/22  Yes Agbor-Etang, Arlys John, MD  apixaban (ELIQUIS) 5 MG TABS tablet Take 1 tablet (5 mg total) by mouth 2 (two) times daily. 11/19/22  Yes Cannady, Jolene T, NP  Desoximetasone 0.25 % LIQD Apply to scalp daily for 2 weeks and then only use as needed for itchy scalp (no more then twice weekly). 07/04/22  Yes Cannady, Jolene T, NP  ezetimibe (ZETIA) 10 MG tablet Take 1 tablet (10 mg total) by mouth daily. 07/04/22  Yes Cannady, Jolene T, NP  hydrALAZINE (APRESOLINE) 50 MG tablet Take 1 tablet (50 mg total) by mouth in the morning and at bedtime. 10/22/22 10/22/23 Yes Agbor-Etang, Arlys John, MD  hydrochlorothiazide (HYDRODIURIL) 25 MG tablet Take 1 tablet (25 mg total) by mouth daily. 01/05/23 04/05/24 Yes Cannady, Dorie Rank, NP  icosapent Ethyl (VASCEPA) 1 g capsule Take 2 capsules (2 g total) by mouth 2 (two) times daily. 01/05/23  Yes Cannady, Jolene T, NP  ketoconazole (NIZORAL) 2 % shampoo Apply 1 Application topically 2 (two) times a week. 07/07/22  Yes Cannady, Jolene T, NP  losartan (COZAAR) 100 MG tablet Take 1 tablet (100 mg total) by mouth daily. 01/05/23  Yes Cannady, Jolene T, NP  montelukast (SINGULAIR) 10 MG tablet Take 1 tablet (10 mg total) by mouth daily. 07/04/22  Yes Cannady, Jolene T, NP  nitroGLYCERIN (NITROSTAT) 0.4 MG SL tablet Place 1 tablet (0.4 mg total) under the tongue every  5 (five) minutes as needed for chest pain. For a maximum of 3 doses in 24 hours. 09/24/21  Yes Agbor-Etang, Arlys John, MD  pantoprazole (PROTONIX) 40 MG tablet Take 1 tablet (40 mg total) by mouth in the morning and at bedtime. Need appt for further refills 01/05/23  Yes Cannady, Jolene T, NP  ranolazine (RANEXA) 1000 MG SR tablet Take 1 tablet (1,000 mg total) by mouth 2 (two) times daily. 01/05/23  Yes Cannady, Jolene T, NP  rosuvastatin (CRESTOR) 40 MG tablet Take 1 tablet (40 mg total) by mouth daily. 07/04/22  Yes Cannady, Jolene T, NP  scopolamine (TRANSDERM-SCOP) 1 MG/3DAYS Place 1  patch (1.5 mg total) onto the skin every 3 (three) days. 05/18/23  Yes Cannady, Jolene T, NP  tadalafil (CIALIS) 20 MG tablet Take 1 tablet (20 mg total) by mouth daily as needed for erectile dysfunction. 02/28/22  Yes McGowan, Wellington Hampshire, PA-C    Allergies: Allergies  Allergen Reactions   Codeine Other (See Comments)    "Black outs"    Social History:  reports that he has never smoked. He has never used smokeless tobacco. He reports current alcohol use. He reports that he does not use drugs.   Family History: Family History  Problem Relation Age of Onset   Pancreatic cancer Mother    Cancer Father    Heart attack Paternal Aunt     Review of Systems: Review of Systems  Constitutional:  Negative for chills and fever.  HENT:  Negative for hearing loss.   Respiratory:  Negative for shortness of breath.   Cardiovascular:  Negative for chest pain.  Gastrointestinal:  Positive for abdominal pain and nausea. Negative for constipation and vomiting.  Genitourinary:  Negative for dysuria.  Musculoskeletal:  Negative for myalgias.  Skin:  Negative for rash.  Neurological:  Negative for dizziness.  Psychiatric/Behavioral:  Negative for depression.     Physical Exam BP (!) 151/88 (BP Location: Left Arm, Patient Position: Sitting, Cuff Size: Large)   Pulse 64   Temp 98.2 F (36.8 C) (Oral)   Ht 5\' 7"  (1.702 m)   Wt 201 lb 6.4 oz (91.4 kg)   SpO2 99%   BMI 31.54 kg/m  CONSTITUTIONAL: No acute distress, well nourished. HEENT:  Normocephalic, atraumatic, extraocular motion intact. NECK: Trachea is midline, and there is no jugular venous distension.  RESPIRATORY:  Lungs are clear, and breath sounds are equal bilaterally. Normal respiratory effort without pathologic use of accessory muscles. CARDIOVASCULAR: Heart is regular without murmurs, gallops, or rubs. GI: The abdomen is soft, non-distended, with some soreness to palpation at the umbilicus.  The patient has an incarcerated umbilical  hernia, that is about 2.5 cm in size.  No overlying skin changes and no evidence of strangulation. The patient also has a small reducible right inguinal hernia.  No palpable hernia in the left groin.  MUSCULOSKELETAL:  Normal muscle strength and tone in all four extremities.  No peripheral edema or cyanosis. SKIN: Skin turgor is normal. There are no pathologic skin lesions.  NEUROLOGIC:  Motor and sensation is grossly normal.  Cranial nerves are grossly intact. PSYCH:  Alert and oriented to person, place and time. Affect is normal.  Laboratory Analysis: Labs from 01/20/23: Sodium 141, potassium 4.1, chloride 103, CO2 24, BUN 15, creatinine 1.09.  Imaging: No results found.  Assessment and Plan: This is a 62 y.o. male with an incarcerated umbilical hernia and a reducible right inguinal hernia.  - Discussed with the patient the additional finding  of a right inguinal hernia as well as his incarcerated umbilical hernia.  Overall discussed how hernias can form and that the natural progression of hernias is to only progressively get larger or more symptomatic with time.  Overall the patient has had this umbilical hernia for a few years and at first it was reducible and not symptomatic and now is incarcerated with some additional discomfort.  His right inguinal hernia was unknown to the patient but is palpable on exam today.  I do not see a specific inguinal hernia in the right groin on his CT scan from last year so this may be relatively a new finding.  Discussed with patient that unfortunately there is no medical or exercise or dietary treatment to repair the hernia and only surgical methods can help.  However discussed with him that we could potentially do both repairs during minimally invasive.  He is in agreement with this and would like to have surgery done at some point in the future. - Discussed with him then the plan for robotic assisted right inguinal hernia repair with an open incarcerated  umbilical hernia repair.  Reviewed with the patient the surgery at length including the planned incisions, the use of mesh, and postoperative activity restrictions, and he is willing to proceed. -The patient would like to schedule surgery for November he goes on a trip in October.  As such, we will schedule the patient for surgery on 07/23/2023.  Will send for both medical and cardiology clearance and to help [place] with stopping his Eliquis prior to surgery.  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  Will defer to his primary team for this.  Is able to stop his Eliquis, last dose will be on 07/20/2023. - Patient will follow-up with me towards the end of October for H&P update.  All of his questions have been answered.  I spent 40 minutes dedicated to the care of this patient on the date of this encounter to include pre-visit review of records, face-to-face time with the patient discussing diagnosis and management, and any post-visit coordination of care.   Howie Ill, MD [facility]

## 2023-05-27 NOTE — Telephone Encounter (Signed)
Faxed cardiac clearance to Dr. Azucena Cecil at [phone].

## 2023-05-27 NOTE — Telephone Encounter (Signed)
Pre-operative Risk Assessment    Patient Name: Jaime [name]  DOB: [birth year] MRN: [id number]      Request for Surgical Clearance    Procedure:  Robotic Umbilical Hernia Repair  Date of Surgery:  Clearance 07/23/23                                 Surgeon:  Dr. Henrene Dodge Surgeon's Group or Practice Name:  [facility] Phone number:  [phone] Fax number:  [phone]   Type of Clearance Requested:   - Medical    Type of Anesthesia:  General    Additional requests/questions:    Signed, Lauralee Evener V   05/27/2023, 1:15 PM

## 2023-05-28 ENCOUNTER — Telehealth: Payer: Self-pay | Admitting: Surgery

## 2023-05-28 NOTE — Telephone Encounter (Signed)
Error/bmv

## 2023-05-28 NOTE — Telephone Encounter (Signed)
Patient has been advised of Pre-Admission date/time, and Surgery date at [facility].  Surgery Date: 07/23/23 Preadmission Testing Date: 07/15/23 (phone 8a-1p)  Patient has been made aware to call [phone], between 1-3:00pm the day before surgery, to find out what time to arrive for surgery.

## 2023-05-29 ENCOUNTER — Telehealth: Payer: Self-pay

## 2023-05-29 NOTE — Telephone Encounter (Signed)
Received surgery clearance form for the patient. Surgery is scheduled for 07/23/23. Patient has a scheduled appointment 07/21/23.   Please reach out to patient to see if he can reschedule for a few days sooner in November. Labs and other requirements have to be done before surgery and having the appointment only 2 days sooner may be to close.

## 2023-05-29 NOTE — Telephone Encounter (Signed)
Clearance form placed in the incomplete bin.

## 2023-05-29 NOTE — Telephone Encounter (Signed)
Called and scheduled patient appointment on 07/20/2023 @ 8:40 am.

## 2023-06-03 ENCOUNTER — Emergency Department: Payer: BC Managed Care – PPO

## 2023-06-03 ENCOUNTER — Other Ambulatory Visit: Payer: Self-pay

## 2023-06-03 ENCOUNTER — Emergency Department
Admission: EM | Admit: 2023-06-03 | Discharge: 2023-06-03 | Disposition: A | Discharge status: Home / Self Care | Payer: BC Managed Care – PPO | Attending: Emergency Medicine | Admitting: Emergency Medicine

## 2023-06-03 DIAGNOSIS — I10 Essential (primary) hypertension: Secondary | ICD-10-CM | POA: Insufficient documentation

## 2023-06-03 DIAGNOSIS — I2510 Atherosclerotic heart disease of native coronary artery without angina pectoris: Secondary | ICD-10-CM | POA: Insufficient documentation

## 2023-06-03 DIAGNOSIS — R110 Nausea: Secondary | ICD-10-CM | POA: Diagnosis not present

## 2023-06-03 DIAGNOSIS — R1084 Generalized abdominal pain: Secondary | ICD-10-CM | POA: Diagnosis present

## 2023-06-03 LAB — COMPREHENSIVE METABOLIC PANEL
ALT: 34 U/L (ref 0–44)
AST: 23 U/L (ref 15–41)
Albumin: 3.8 g/dL (ref 3.5–5.0)
Alkaline Phosphatase: 56 U/L (ref 38–126)
Anion gap: 11 (ref 5–15)
BUN: 17 mg/dL (ref 6–20)
CO2: 26 mmol/L (ref 22–32)
Calcium: 8.9 mg/dL (ref 8.9–10.3)
Chloride: 102 mmol/L (ref 98–111)
Creatinine, Ser: 1.28 mg/dL — ABNORMAL HIGH (ref 0.61–1.24)
GFR, Estimated: >60 mL/min (ref >60)
Glucose, Bld: 132 mg/dL — ABNORMAL HIGH (ref 70–99)
Potassium: 3.6 mmol/L (ref 3.5–5.1)
Sodium: 139 mmol/L (ref 135–145)
Total Bilirubin: 0.6 mg/dL (ref 0.3–1.2)
Total Protein: 7.1 g/dL (ref 6.5–8.1)

## 2023-06-03 LAB — LIPASE, BLOOD: Lipase: 29 U/L (ref 11–51)

## 2023-06-03 LAB — URINALYSIS, ROUTINE W REFLEX MICROSCOPIC
Bilirubin Urine: NEGATIVE
Glucose, UA: NEGATIVE mg/dL
Hgb urine dipstick: NEGATIVE
Ketones, ur: NEGATIVE mg/dL
Leukocytes,Ua: NEGATIVE
Nitrite: NEGATIVE
Protein, ur: NEGATIVE mg/dL
Specific Gravity, Urine: 1.017 (ref 1.005–1.030)
pH: 5 (ref 5.0–8.0)

## 2023-06-03 LAB — CBC
HCT: 44.4 % (ref 39.0–52.0)
Hemoglobin: 15.6 g/dL (ref 13.0–17.0)
MCH: 30.5 pg (ref 26.0–34.0)
MCHC: 35.1 g/dL (ref 30.0–36.0)
MCV: 86.9 fL (ref 80.0–100.0)
Platelets: 225 10*3/uL (ref 150–400)
RBC: 5.11 MIL/uL (ref 4.22–5.81)
RDW: 12.5 % (ref 11.5–15.5)
WBC: 9.7 10*3/uL (ref 4.0–10.5)
nRBC: 0 % (ref 0.0–0.2)

## 2023-06-03 LAB — LACTIC ACID, PLASMA: Lactic Acid, Venous: 1.4 mmol/L (ref 0.5–1.9)

## 2023-06-03 MED ORDER — IOHEXOL 300 MG/ML  SOLN
100.0000 mL | Freq: Once | INTRAMUSCULAR | Status: AC | PRN
  Administered 2023-06-03: 100 mL via INTRAVENOUS

## 2023-06-03 NOTE — Discharge Instructions (Signed)
Follow-up with Dr. Aleen Campi.  Return to the ER for new, worsening, or persistent severe abdominal pain, distension or swelling, nausea or vomiting, fever, or any other new or worsening symptoms that concern you.

## 2023-06-03 NOTE — Consult Note (Signed)
Subjective:   CC: abdominal pain   HPI:  Jaime [name] is a 62 y.o. male who was referred by [facility] for evaluation of above cc.   Symptoms were first noted 1 day ago. Pain is sharp, recurrent, entire abdomen including flanks.nothing specific associated with nausea, exacerbated by touch. lump is reducible. Last BM this am and little more this pm. No changes in BMs    Past Medical History:  has a past medical history of Allergy, Clotting disorder (HCC), GERD (gastroesophageal reflux disease), History of heart attack (2012), Hyperlipidemia, Hypertension, and Kidney stones.  Past Surgical History:  Past Surgical History:  Procedure Laterality Date   CORONARY ANGIOPLASTY WITH STENT PLACEMENT  2012   EXTRACORPOREAL SHOCK WAVE LITHOTRIPSY Left 01/30/2022   Procedure: EXTRACORPOREAL SHOCK WAVE LITHOTRIPSY (ESWL);  Surgeon: Riki Altes, MD;  Location: [facility] ORS;  Service: Urology;  Laterality: Left;   REPLACEMENT TOTAL KNEE BILATERAL Bilateral    2017 left knee, 2019 right knee   WRIST SURGERY Left    Carpel Tunnel    Family History: family history includes Cancer in his father; Heart attack in his paternal aunt; Pancreatic cancer in his mother.  Social History:  reports that he has never smoked. He has never used smokeless tobacco. He reports current alcohol use. He reports that he does not use drugs.  Current Medications:  Prior to Admission medications   Medication Sig Start Date End Date Taking? Authorizing Provider  amLODipine (NORVASC) 10 MG tablet Take 1 tablet (10 mg total) by mouth daily. 02/06/22   Debbe Odea, MD  apixaban (ELIQUIS) 5 MG TABS tablet Take 1 tablet (5 mg total) by mouth 2 (two) times daily. 11/19/22   Cannady, Corrie Dandy T, NP  Desoximetasone 0.25 % LIQD Apply to scalp daily for 2 weeks and then only use as needed for itchy scalp (no more then twice weekly). 07/04/22   Cannady, Corrie Dandy T, NP  ezetimibe (ZETIA) 10 MG tablet Take 1 tablet (10 mg total) by mouth  daily. 07/04/22   Cannady, Corrie Dandy T, NP  hydrALAZINE (APRESOLINE) 50 MG tablet Take 1 tablet (50 mg total) by mouth in the morning and at bedtime. 10/22/22 10/22/23  Debbe Odea, MD  hydrochlorothiazide (HYDRODIURIL) 25 MG tablet Take 1 tablet (25 mg total) by mouth daily. 01/05/23 04/05/24  Aura Dials T, NP  icosapent Ethyl (VASCEPA) 1 g capsule Take 2 capsules (2 g total) by mouth 2 (two) times daily. 01/05/23   Cannady, Corrie Dandy T, NP  ketoconazole (NIZORAL) 2 % shampoo Apply 1 Application topically 2 (two) times a week. 07/07/22   Cannady, Corrie Dandy T, NP  losartan (COZAAR) 100 MG tablet Take 1 tablet (100 mg total) by mouth daily. 01/05/23   Cannady, Corrie Dandy T, NP  montelukast (SINGULAIR) 10 MG tablet Take 1 tablet (10 mg total) by mouth daily. 07/04/22   Cannady, Corrie Dandy T, NP  nitroGLYCERIN (NITROSTAT) 0.4 MG SL tablet Place 1 tablet (0.4 mg total) under the tongue every 5 (five) minutes as needed for chest pain. For a maximum of 3 doses in 24 hours. 09/24/21   Debbe Odea, MD  pantoprazole (PROTONIX) 40 MG tablet Take 1 tablet (40 mg total) by mouth in the morning and at bedtime. Need appt for further refills 01/05/23   Aura Dials T, NP  ranolazine (RANEXA) 1000 MG SR tablet Take 1 tablet (1,000 mg total) by mouth 2 (two) times daily. 01/05/23   Cannady, Corrie Dandy T, NP  rosuvastatin (CRESTOR) 40 MG tablet Take 1 tablet (40 mg  total) by mouth daily. 07/04/22   Cannady, Corrie Dandy T, NP  scopolamine (TRANSDERM-SCOP) 1 MG/3DAYS Place 1 patch (1.5 mg total) onto the skin every 3 (three) days. 05/18/23   Cannady, Corrie Dandy T, NP  tadalafil (CIALIS) 20 MG tablet Take 1 tablet (20 mg total) by mouth daily as needed for erectile dysfunction. 02/28/22   Harle Battiest, PA-C    Allergies:  Allergies as of 06/03/2023 - Review Complete 06/03/2023  Allergen Reaction Noted   Codeine Other (See Comments) 06/11/2021    ROS:  General: Denies weight loss, weight gain, fatigue, fevers, chills, and night  sweats. Eyes: Denies blurry vision, double vision, eye pain, itchy eyes, and tearing. Ears: Denies hearing loss, earache, and ringing in ears. Nose: Denies sinus pain, congestion, infections, runny nose, and nosebleeds. Mouth/throat: Denies hoarseness, sore throat, bleeding gums, and difficulty swallowing. Heart: Denies chest pain, palpitations, racing heart, irregular heartbeat, leg pain or swelling, and decreased activity tolerance. Respiratory: Denies breathing difficulty, shortness of breath, wheezing, cough, and sputum. GI: Denies change in appetite, heartburn, nausea, vomiting, constipation, diarrhea, and blood in stool. GU: Denies difficulty urinating, pain with urinating, urgency, frequency, blood in urine. Musculoskeletal: Denies joint stiffness, pain, swelling, muscle weakness. Skin: Denies rash, itching, mass, tumors, sores, and boils Neurologic: Denies headache, fainting, dizziness, seizures, numbness, and tingling. Psychiatric: Denies depression, anxiety, difficulty sleeping, and memory loss. Endocrine: Denies heat or cold intolerance, and increased thirst or urination. Blood/lymph: Denies easy bruising, easy bruising, and swollen glands     Objective:     BP (!) 147/87 (BP Location: Right Arm)   Pulse 80   Temp 98.3 F (36.8 C) (Oral)   Resp 16   Ht 5\' 7"  (1.702 m)   Wt 91 kg   SpO2 90%   BMI 31.42 kg/m   Constitutional :  alert, cooperative, appears stated age, and no distress  Lymphatics/Throat:  no asymmetry, masses, or scars  Respiratory:  clear to auscultation bilaterally  Cardiovascular:  regular rate and rhythm  Gastrointestinal: Soft, but voluntary guarding with palpation in all four quadrants, no increased focal tenderness at the partially reducible umbilical and right inguinal hernia .   Musculoskeletal: Steady gait and movement around halls  Skin: Cool and moist. No rashes  Psychiatric: Normal affect, non-agitated, not confused       LABS:      Latest Ref Rng & Units 06/03/2023    2:46 PM 01/20/2023    8:49 AM 01/05/2023    8:20 AM  CMP  Glucose 70 - 99 mg/dL 295  621  308   BUN 6 - 20 mg/dL 17  15  22    Creatinine 0.61 - 1.24 mg/dL 6.57  8.46  9.62   Sodium 135 - 145 mmol/L 139  141  141   Potassium 3.5 - 5.1 mmol/L 3.6  4.1  3.3   Chloride 98 - 111 mmol/L 102  103  99   CO2 22 - 32 mmol/L 26  24  20    Calcium 8.9 - 10.3 mg/dL 8.9  9.3  9.3   Total Protein 6.5 - 8.1 g/dL 7.1   6.6   Total Bilirubin 0.3 - 1.2 mg/dL 0.6   0.5   Alkaline Phos 38 - 126 U/L 56   88   AST 15 - 41 U/L 23   21   ALT 0 - 44 U/L 34   32       Latest Ref Rng & Units 06/03/2023    2:46 PM 11/13/2022  7:51 PM 07/04/2022    4:05 PM  CBC  WBC 4.0 - 10.5 K/uL 9.7  7.1  7.2   Hemoglobin 13.0 - 17.0 g/dL 40.9  81.1  91.4   Hematocrit 39.0 - 52.0 % 44.4  45.6  47.6   Platelets 150 - 400 K/uL 225  194  222     RADS: CLINICAL DATA:  Fluid retention. History of hernia. Nausea and pain.   EXAM: CT ABDOMEN AND PELVIS WITH CONTRAST   TECHNIQUE: Multidetector CT imaging of the abdomen and pelvis was performed using the standard protocol following bolus administration of intravenous contrast.   RADIATION DOSE REDUCTION: This exam was performed according to the departmental dose-optimization program which includes automated exposure control, adjustment of the mA and/or kV according to patient size and/or use of iterative reconstruction technique.   CONTRAST:  OMNIPAQUE IOHEXOL 300 MG/ML  SOLN   COMPARISON:  CT abdomen and pelvis 12/18/2021   FINDINGS: Lower chest: No acute abnormality.   Hepatobiliary: Hepatic steatosis. Cholelithiasis without evidence of cholecystitis. No biliary dilation.   Pancreas: Unremarkable.   Spleen: Unremarkable.   Adrenals/Urinary Tract: Normal adrenal glands. No urinary calculi or hydronephrosis. Bladder is unremarkable.   Stomach/Bowel: Normal caliber large and small bowel. No bowel wall thickening. The  appendix is normal.Stomach is within normal limits.   Vascular/Lymphatic: Aortic atherosclerosis. No enlarged abdominal or pelvic lymph nodes.   Reproductive: Unremarkable.   Other: No free intraperitoneal fluid or air. Tiny fat containing umbilical hernia.   Musculoskeletal: No acute fracture.   IMPRESSION: 1. No acute abnormality in the abdomen or pelvis. 2. Hepatic steatosis. 3. Cholelithiasis without evidence of cholecystitis.   Aortic Atherosclerosis (ICD10-I70.0).     Electronically Signed   By: Minerva Fester M.D.   On: 06/03/2023 19:10 Assessment:   Abdominal pain Plan:   Pain pattern very atypical for pain from hernias, since there is no complaint of acute focal pain at hernia sites, that remain soft.  CT unremarkable for any acute pathology either.  Recommend some gas-x, and monitor as outpatient since patient came in on boss' insistence, and is currently declining any pain medication even though he has complained specifically of abdominal pain.  He can f/u with Dr. Adelene Idler office for further questions or concerns.  No leukocytosis noted. Will add lactic acid level for possible ischemic cause for pain?  If negative, ok to be discharged for outpt followup.     labs/images/medications/previous chart entries reviewed personally and relevant changes/updates noted above.

## 2023-06-03 NOTE — ED Triage Notes (Signed)
Pt to ED for fluid retention to abd started this am. Reports has hernia. +nausea, pain. Scheduled for hernia surgery in november

## 2023-06-03 NOTE — ED Provider Notes (Signed)
[facility] Provider Note    Event Date/Time   First MD Initiated Contact with Patient 06/03/23 1618     (approximate)   History   Hernia   HPI  Jaime [name] is a 62 y.o. male with a history of CAD, DVT, hypertension, and GERD who presents with abdominal pain, acutely worsened today while he was at work, diffuse, and associated with distention.  The patient has umbilical and inguinal hernias.  He saw surgery a week ago and was recommended for surgery on them at that time but wanted to wait until after a trip he is planning to take next month.  However, the pain and discomfort have acutely worsened today.  He reports nausea but no vomiting.  He had a bowel movement this morning but has not passed any gas since that time.  I reviewed the past medical records.  The patient's most recent outpatient encounter was with general surgery on 9/18 for evaluation of an umbilical hernia.  Per the records, he is planned for surgery in November once medically cleared.   Physical Exam   Triage Vital Signs: ED Triage Vitals  Encounter Vitals Group     BP 06/03/23 1433 (!) 147/87     Systolic BP Percentile --      Diastolic BP Percentile --      Pulse Rate 06/03/23 1433 80     Resp 06/03/23 1433 16     Temp 06/03/23 1433 98.3 F (36.8 C)     Temp Source 06/03/23 1433 Oral     SpO2 06/03/23 1433 90 %     Weight 06/03/23 1444 200 lb 9.9 oz (91 kg)     Height 06/03/23 1444 5\' 7"  (1.702 m)     Head Circumference --      Peak Flow --      Pain Score 06/03/23 1444 10     Pain Loc --      Pain Education --      Exclude from Growth Chart --     Most recent vital signs: Vitals:   06/03/23 1433 06/03/23 2100  BP: (!) 147/87 (!) 145/82  Pulse: 80 78  Resp: 16 18  Temp: 98.3 F (36.8 C) 98.4 F (36.9 C)  SpO2: 90% 94%     General: Awake, no distress.  CV:  Good peripheral perfusion.  Resp:  Normal effort.  Abd:  Soft with mild diffuse tenderness.  Moderately  distended. Other:  No jaundice or scleral icterus.   ED Results / Procedures / Treatments   Labs (all labs ordered are listed, but only abnormal results are displayed) Labs Reviewed  COMPREHENSIVE METABOLIC PANEL - Abnormal; Notable for the following components:      Result Value   Glucose, Bld 132 (*)    Creatinine, Ser 1.28 (*)    All other components within normal limits  URINALYSIS, ROUTINE W REFLEX MICROSCOPIC - Abnormal; Notable for the following components:   Color, Urine YELLOW (*)    APPearance CLEAR (*)    All other components within normal limits  LIPASE, BLOOD  CBC  LACTIC ACID, PLASMA     EKG    RADIOLOGY  CT abdomen/pelvis: I independently viewed and interpreted the images; there are no dilated bowel loops or any free air or free fluid.  Radiology report indicates no acute abnormality.  PROCEDURES:  Critical Care performed: No  Procedures   MEDICATIONS ORDERED IN ED: Medications  iohexol (OMNIPAQUE) 300 MG/ML solution 100 mL (100  mLs Intravenous Contrast Given 06/03/23 1725)     IMPRESSION / MDM / ASSESSMENT AND PLAN / ED COURSE  I reviewed the triage vital signs and the nursing notes.  62 year old male with PMH as noted above including known umbilical and inguinal hernias presents with acute onset of worsened abdominal pain today associated with distention and nausea.  On exam the abdomen is distended and mildly tender.  Differential diagnosis includes, but is not limited to, incarcerated hernia, SBO, ileus, volvulus, gastroenteritis.  We will obtain lab workup and CT for further evaluation.  Patient's presentation is most consistent with acute complicated illness / injury requiring diagnostic workup.  The patient is on the cardiac monitor to evaluate for evidence of arrhythmia and/or significant heart rate changes.  ----------------------------------------- 9:15 PM on 06/03/2023 -----------------------------------------  Lab workup is  unremarkable.  There is no leukocytosis.  LFTs and lipase are normal.  CT shows no evidence of an incarcerated hernia, SBO, or other acute findings.  The patient appears relatively well although is still complaining of pain.  I consulted Dr. Tonna Boehringer from general surgery to evaluate the patient to determine whether his pain could be related to the hernia or whether there was any indication for surgery more urgently than November.  He recommended obtaining a lactate, although clinically we have a low suspicion for ischemic colitis.  The lactate is normal.  Dr. Tonna Boehringer does not recommend surgery or other acute intervention.  I counseled the patient on the results of the workup.  His pain seems to be improved and he appears much more comfortable.  At this time there is no evidence of an acute intra-abdominal process or indication for further workup or management in the hospital.  He feels comfortable going home.  I gave strict return precautions and he expressed understanding.  He will follow-up with Dr. Aleen Campi.   FINAL CLINICAL IMPRESSION(S) / ED DIAGNOSES   Final diagnoses:  Generalized abdominal pain     Rx / DC Orders   ED Discharge Orders     None        Note:  This document was prepared using Dragon voice recognition software and may include unintentional dictation errors.    Dionne Bucy, MD 06/04/23 [phone]

## 2023-06-04 ENCOUNTER — Telehealth: Payer: Self-pay | Admitting: Surgery

## 2023-06-04 ENCOUNTER — Telehealth: Payer: Self-pay | Admitting: Cardiology

## 2023-06-04 NOTE — Telephone Encounter (Signed)
Patient is scheduled for inguinal and umbilical hernia surgery on 07/23/23.  He wants to have done sooner, as he is having more pain with it.  Patient is pending medical and cardiac clearance.  Can we see if patient can see cardiology sooner?

## 2023-06-04 NOTE — Telephone Encounter (Signed)
Patient stated he will need to move his hernia surgery up and wants to get sooner clearance.

## 2023-06-04 NOTE — Telephone Encounter (Signed)
Patient called and said that he need appt asap instead of 10/3. Said that if not he will be heading over to [facility]. Says he wants to talk with Dr. Sandie Ano or nurse

## 2023-06-04 NOTE — Telephone Encounter (Signed)
Returned the call to the patient. He stated that he is currently in the [facility] ED for severe abdominal pain. He will keep his current appointment.

## 2023-06-04 NOTE — Telephone Encounter (Signed)
I s/w the surgeon's office and confirmed if surgery date had been moved up. Per surgery scheduler for Dr. Aleen Campi, surgery has not been moved up as Dr. Aleen Campi is on vacation and is not back until next week.   I then called the pt and tried to explain to him that we are all working very hard to care for for him and it is not our goal to ever keep our pt's in pain, see previous notes pt was in ED and c/o pain. Pt states he has been calling Dr. Helane Gunther cardiologist about clearance. I explained to the pt even if Dr. Azucena Cecil cleared him today, surgery still cannot be moved up as Dr. Aleen Campi is on vacation. I also informed the pt that the anesthesiologist will want that cardiac clearance as well. I tried my very best to explain this all to the pt. I understand the pt is not happy. Pt's wife then got on the phone and said that the hernia seems to be getting bigger and more red and afraid of rupture. I stated that if this is the case then I suggested they call Dr. Adelene Idler office and tell them. I stated that they may want to see him in the office. Pt's wife hung up on me. I am not sure if she heard me suggesting they call Dr. Adelene Idler office and let them know hernia seems to be getting bigger.   In the meantime I will fax notes to Dr. Adelene Idler office as Lorain Childes as well.

## 2023-06-08 NOTE — Telephone Encounter (Signed)
Patient Name: Jaime [name]  DOB: 14-Jan-1961 MRN: [id number]  Primary Cardiologist: Debbe Odea, MD  Chart reviewed as part of pre-operative protocol coverage.  Patient has telephone clearance visit scheduled for 06/11/2023.  Patient is on Eliquis for history of lower extremity DVT and is not managed by cardiology.  Please let us know if you have any further questions.   Napoleon Form, Leodis Rains, NP 06/08/2023, 11:30 AM

## 2023-06-08 NOTE — Telephone Encounter (Signed)
I will forward this back to pre op APP to review notes from Dr. Everlene Farrier and Dr. Aleen Campi.

## 2023-06-08 NOTE — Progress Notes (Unsigned)
Virtual Visit via Telephone Note   Because of Jaime [name]'s co-morbid illnesses, he is at least at moderate risk for complications without adequate follow up.  This format is felt to be most appropriate for this patient at this time.  The patient did not have access to video technology/had technical difficulties with video requiring transitioning to audio format only (telephone).  All issues noted in this document were discussed and addressed.  No physical exam could be performed with this format.  Please refer to the patient's chart for his consent to telehealth for [facility].  Evaluation Performed:  Preoperative cardiovascular risk assessment _____________   Date:  06/08/2023   Patient ID:  Jaime [name], DOB 1961-02-06, MRN [id number] Patient Location:  Home Provider location:   Office  Primary Care Provider:  Marjie Skiff, NP Primary Cardiologist:  Jaime Odea, MD  Chief Complaint / Patient Profile   62 y.o. 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  He is pending Robotic Umbilical Hernia repair on 07/23/2023, and presents today for telephonic preoperative cardiovascular risk assessment.  History of Present Illness    Jaime [name] is a 62 y.o. male who presents via audio/video conferencing for a telehealth visit today.  Pt was last seen in cardiology clinic on 01/20/2023  by y Dr. Azucena Cecil.  At that time Jaime [name] was doing well .  The patient is now pending procedure as outlined above. Since his last visit, he has had increased abdominal pain, seen recently in the ED at [facility] for worsening pain. Eliquis is not managed by cardiology as this is for DVT. Refill last authorized by Jaime Skiff, NP   Past Medical History    Past Medical History:  Diagnosis Date   Allergy    Clotting disorder (HCC)    GERD (gastroesophageal reflux disease)    History of heart  attack 2012   Hyperlipidemia    Hypertension    Kidney stones    Past Surgical History:  Procedure Laterality Date   CORONARY ANGIOPLASTY WITH STENT PLACEMENT  2012   EXTRACORPOREAL SHOCK WAVE LITHOTRIPSY Left 01/30/2022   Procedure: EXTRACORPOREAL SHOCK WAVE LITHOTRIPSY (ESWL);  Surgeon: Riki Altes, MD;  Location: [facility] ORS;  Service: Urology;  Laterality: Left;   REPLACEMENT TOTAL KNEE BILATERAL Bilateral    2017 left knee, 2019 right knee   WRIST SURGERY Left    Carpel Tunnel    Allergies  Allergies  Allergen Reactions   Codeine Other (See Comments)    "Black outs"    Home Medications    Prior to Admission medications   Medication Sig Start Date End Date Taking? Authorizing Provider  amLODipine (NORVASC) 10 MG tablet Take 1 tablet (10 mg total) by mouth daily. 02/06/22   Jaime Odea, MD  apixaban (ELIQUIS) 5 MG TABS tablet Take 1 tablet (5 mg total) by mouth 2 (two) times daily. 11/19/22   Cannady, Corrie Dandy T, NP  Desoximetasone 0.25 % LIQD Apply to scalp daily for 2 weeks and then only use as needed for itchy scalp (no more then twice weekly). 07/04/22   Cannady, Corrie Dandy T, NP  ezetimibe (ZETIA) 10 MG tablet Take 1 tablet (10 mg total) by mouth daily. 07/04/22   Cannady, Corrie Dandy T, NP  hydrALAZINE (APRESOLINE) 50 MG tablet Take 1 tablet (50 mg total) by mouth in the morning and at bedtime. 10/22/22 10/22/23  Jaime Odea, MD  hydrochlorothiazide (  HYDRODIURIL) 25 MG tablet Take 1 tablet (25 mg total) by mouth daily. 01/05/23 04/05/24  Aura Dials T, NP  icosapent Ethyl (VASCEPA) 1 g capsule Take 2 capsules (2 g total) by mouth 2 (two) times daily. 01/05/23   Cannady, Corrie Dandy T, NP  ketoconazole (NIZORAL) 2 % shampoo Apply 1 Application topically 2 (two) times a week. 07/07/22   Cannady, Corrie Dandy T, NP  losartan (COZAAR) 100 MG tablet Take 1 tablet (100 mg total) by mouth daily. 01/05/23   Cannady, Corrie Dandy T, NP  montelukast (SINGULAIR) 10 MG tablet Take 1 tablet (10 mg  total) by mouth daily. 07/04/22   Cannady, Corrie Dandy T, NP  nitroGLYCERIN (NITROSTAT) 0.4 MG SL tablet Place 1 tablet (0.4 mg total) under the tongue every 5 (five) minutes as needed for chest pain. For a maximum of 3 doses in 24 hours. 09/24/21   Jaime Odea, MD  pantoprazole (PROTONIX) 40 MG tablet Take 1 tablet (40 mg total) by mouth in the morning and at bedtime. Need appt for further refills 01/05/23   Aura Dials T, NP  ranolazine (RANEXA) 1000 MG SR tablet Take 1 tablet (1,000 mg total) by mouth 2 (two) times daily. 01/05/23   Cannady, Corrie Dandy T, NP  rosuvastatin (CRESTOR) 40 MG tablet Take 1 tablet (40 mg total) by mouth daily. 07/04/22   Cannady, Corrie Dandy T, NP  scopolamine (TRANSDERM-SCOP) 1 MG/3DAYS Place 1 patch (1.5 mg total) onto the skin every 3 (three) days. 05/18/23   Cannady, Corrie Dandy T, NP  tadalafil (CIALIS) 20 MG tablet Take 1 tablet (20 mg total) by mouth daily as needed for erectile dysfunction. 02/28/22   Harle Battiest, PA-C    Physical Exam    Vital Signs:  [name] does not have vital signs available for review today.***  Given telephonic nature of communication, physical exam is limited. AAOx3. NAD. Normal affect.  Speech and respirations are unlabored.  Accessory Clinical Findings    None  Assessment & Plan    1.  Preoperative Cardiovascular Risk Assessment:  According to the Revised Cardiac Risk Index (RCRI), his    His   according to the [facility] Activity Status Index (DASI).   Therefore, based on ACC/AHA guidelines, patient would be at acceptable risk for the planned procedure without further cardiovascular testing. I will route this recommendation to the requesting party via Epic fax function.   The patient was advised that if he develops new symptoms prior to surgery to contact our office to arrange for a follow-up visit, and he verbalized understanding.  Eliquis is not managed by cardiology.  Last refilled by PCP, for DVT. Please reach out to that  practice for recommendations concerning cessation perioperatively.    A copy of this note will be routed to requesting surgeon.  Time:   Today, I have spent *** minutes with the patient with telehealth technology discussing medical history, symptoms, and management plan.     Joni Reining, NP  06/08/2023, 7:45 AM

## 2023-06-11 ENCOUNTER — Ambulatory Visit: Payer: BC Managed Care – PPO | Attending: Internal Medicine

## 2023-06-11 DIAGNOSIS — Z01818 Encounter for other preprocedural examination: Secondary | ICD-10-CM | POA: Diagnosis not present

## 2023-06-11 NOTE — Addendum Note (Signed)
Addended by: Jodelle Gross on: 06/11/2023 09:34 AM   Modules accepted: Level of Service

## 2023-06-15 ENCOUNTER — Telehealth: Payer: Self-pay | Admitting: Nurse Practitioner

## 2023-06-15 NOTE — Telephone Encounter (Signed)
Appointment has been made

## 2023-06-15 NOTE — Telephone Encounter (Signed)
Copied from CRM [id number]. Topic: General - Other >> Jun 15, 2023  8:31 AM Everette C wrote: Reason for CRM: [facility] has called to follow up on a previously submitted request for surgical clearance and apixaban (ELIQUIS) 5 MG TABS tablet [[id number]] cessation  The patient has surgery scheduled for 07/23/23 Please contact further when possible

## 2023-06-15 NOTE — Telephone Encounter (Signed)
Form was received via fax this morning. Patient needs to be scheduled for a surgical clearance appointment. Surgery is scheduled for 07/23/23. Clearance appointment must be done within 30 days prior to appointment. Please reach out to patient to schedule.   Form placed in the incomplete bin until clearance appointment.

## 2023-06-17 ENCOUNTER — Ambulatory Visit: Payer: BC Managed Care – PPO | Admitting: Surgery

## 2023-07-02 ENCOUNTER — Ambulatory Visit: Payer: BC Managed Care – PPO | Admitting: Nurse Practitioner

## 2023-07-03 ENCOUNTER — Encounter: Payer: Self-pay | Admitting: Nurse Practitioner

## 2023-07-03 ENCOUNTER — Ambulatory Visit: Payer: BC Managed Care – PPO | Admitting: Surgery

## 2023-07-03 ENCOUNTER — Ambulatory Visit: Payer: BC Managed Care – PPO | Admitting: Nurse Practitioner

## 2023-07-03 VITALS — BP 119/80 | HR 84 | Temp 97.6°F | Ht 67.0 in | Wt 198.2 lb

## 2023-07-03 DIAGNOSIS — Z01818 Encounter for other preprocedural examination: Secondary | ICD-10-CM

## 2023-07-03 DIAGNOSIS — Z86718 Personal history of other venous thrombosis and embolism: Secondary | ICD-10-CM

## 2023-07-03 DIAGNOSIS — K4090 Unilateral inguinal hernia, without obstruction or gangrene, not specified as recurrent: Secondary | ICD-10-CM | POA: Diagnosis not present

## 2023-07-03 NOTE — Assessment & Plan Note (Signed)
Scheduled for surgery 07/13/23.  Overall cleared by PCP.  At this time have recommended he hold Eliquis 3 days prior to surgery and if no heavy bleeding may restart in 24 hours. Obtain CBC, CMP, and UA.  EKG overall reassuring.  Cardiology has also cleared patient.

## 2023-07-03 NOTE — Assessment & Plan Note (Signed)
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  Will continue Eliquis 5 MG BID for prevention and may warrant a visit to hematology in future for work-up to ensure no underlying cause for frequent DVTs.  At this time have recommended he hold Eliquis 3 days prior to surgery and if no heavy bleeding may restart in 24 hours.

## 2023-07-03 NOTE — Progress Notes (Signed)
BP 119/80 (BP Location: Left Arm, Patient Position: Sitting, Cuff Size: Large)   Pulse 84   Temp 97.6 F (36.4 C) (Oral)   Ht 5\' 7"  (1.702 m)   Wt 198 lb 3.2 oz (89.9 kg)   SpO2 95%   BMI 31.04 kg/m    Subjective:    Patient ID: Jaime [name], male    DOB: 10/16/1960, 62 y.o.   MRN: [id number]  HPI: Jaime [name] is a 61 y.o. male  Chief Complaint  Patient presents with   Medical Management of Chronic Issues    Surgical clearance, no new concerns today    INGUINAL HERNIA (RIGHT) Presents today for surgical clearance.  Is scheduled to have inguinal hernia repair on 07/23/23.  Currently had telephone clearance with cardiology on 06/11/23 -- which he was cleared for.  Takes Eliquis for history of DVT and recent eGFR was >60. Duration: months Location: right groin Painful: yes Discomfort: yes Bulge: yes Quality:  dull and aching dependent on position Onset: gradual Severity: 5/10 Context: stable Aggravating factors: lifting and changing position   Relevant past medical, surgical, family and social history reviewed and updated as indicated. Interim medical history since our last visit reviewed. Allergies and medications reviewed and updated.  Review of Systems  Constitutional:  Negative for activity change, diaphoresis, fatigue and fever.  Respiratory:  Negative for cough, chest tightness, shortness of breath and wheezing.   Cardiovascular:  Negative for chest pain, palpitations and leg swelling.  Neurological: Negative.   Psychiatric/Behavioral: Negative.      Per HPI unless specifically indicated above     Objective:    BP 119/80 (BP Location: Left Arm, Patient Position: Sitting, Cuff Size: Large)   Pulse 84   Temp 97.6 F (36.4 C) (Oral)   Ht 5\' 7"  (1.702 m)   Wt 198 lb 3.2 oz (89.9 kg)   SpO2 95%   BMI 31.04 kg/m   Wt Readings from Last 3 Encounters:  07/03/23 198 lb 3.2 oz (89.9 kg)  06/03/23 200 lb 9.9 oz (91 kg)  05/27/23 201 lb 6.4 oz (91.4 kg)     Physical Exam Vitals and nursing note reviewed.  Constitutional:      General: He is awake. He is not in acute distress.    Appearance: He is well-developed and well-groomed. He is obese. He is not ill-appearing or toxic-appearing.  HENT:     Head: Normocephalic.     Right Ear: Hearing and external ear normal.     Left Ear: Hearing and external ear normal.  Eyes:     General: Lids are normal.     Extraocular Movements: Extraocular movements intact.     Conjunctiva/sclera: Conjunctivae normal.  Neck:     Thyroid: No thyromegaly.     Vascular: No carotid bruit.  Cardiovascular:     Rate and Rhythm: Normal rate and regular rhythm.     Heart sounds: Normal heart sounds.  Pulmonary:     Effort: No accessory muscle usage or respiratory distress.     Breath sounds: Normal breath sounds.  Abdominal:     General: Bowel sounds are normal. There is no distension.     Palpations: Abdomen is soft.     Tenderness: There is no abdominal tenderness.  Musculoskeletal:     Cervical back: Full passive range of motion without pain.     Right lower leg: No edema.     Left lower leg: No edema.  Lymphadenopathy:     Cervical: No cervical  adenopathy.  Skin:    General: Skin is warm.     Capillary Refill: Capillary refill takes less than 2 seconds.  Neurological:     Mental Status: He is alert and oriented to person, place, and time.     Deep Tendon Reflexes: Reflexes are normal and symmetric.     Reflex Scores:      Brachioradialis reflexes are 2+ on the right side and 2+ on the left side.      Patellar reflexes are 2+ on the right side and 2+ on the left side. Psychiatric:        Attention and Perception: Attention normal.        Mood and Affect: Mood normal.        Speech: Speech normal.        Behavior: Behavior normal. Behavior is cooperative.        Thought Content: Thought content normal.   EKG My review and personal interpretation at Time: 1545   Indication: pre op Rate: 78   Rhythm: sinus Axis: normal Other: No nonspecific st abn, no stemi, no lvh  Results for orders placed or performed during the hospital encounter of 06/03/23  Lipase, blood  Result Value Ref Range   Lipase 29 11 - 51 U/L  Comprehensive metabolic panel  Result Value Ref Range   Sodium 139 135 - 145 mmol/L   Potassium 3.6 3.5 - 5.1 mmol/L   Chloride 102 98 - 111 mmol/L   CO2 26 22 - 32 mmol/L   Glucose, Bld 132 (H) 70 - 99 mg/dL   BUN 17 6 - 20 mg/dL   Creatinine, Ser 1.47 (H) 0.61 - 1.24 mg/dL   Calcium 8.9 8.9 - 82.9 mg/dL   Total Protein 7.1 6.5 - 8.1 g/dL   Albumin 3.8 3.5 - 5.0 g/dL   AST 23 15 - 41 U/L   ALT 34 0 - 44 U/L   Alkaline Phosphatase 56 38 - 126 U/L   Total Bilirubin 0.6 0.3 - 1.2 mg/dL   GFR, Estimated >56 >21 mL/min   Anion gap 11 5 - 15  CBC  Result Value Ref Range   WBC 9.7 4.0 - 10.5 K/uL   RBC 5.11 4.22 - 5.81 MIL/uL   Hemoglobin 15.6 13.0 - 17.0 g/dL   HCT 30.8 65.7 - 84.6 %   MCV 86.9 80.0 - 100.0 fL   MCH 30.5 26.0 - 34.0 pg   MCHC 35.1 30.0 - 36.0 g/dL   RDW 96.2 95.2 - 84.1 %   Platelets 225 150 - 400 K/uL   nRBC 0.0 0.0 - 0.2 %  Urinalysis, Routine w reflex microscopic -Urine, Clean Catch  Result Value Ref Range   Color, Urine YELLOW (A) YELLOW   APPearance CLEAR (A) CLEAR   Specific Gravity, Urine 1.017 1.005 - 1.030   pH 5.0 5.0 - 8.0   Glucose, UA NEGATIVE NEGATIVE mg/dL   Hgb urine dipstick NEGATIVE NEGATIVE   Bilirubin Urine NEGATIVE NEGATIVE   Ketones, ur NEGATIVE NEGATIVE mg/dL   Protein, ur NEGATIVE NEGATIVE mg/dL   Nitrite NEGATIVE NEGATIVE   Leukocytes,Ua NEGATIVE NEGATIVE  Lactic acid, plasma  Result Value Ref Range   Lactic Acid, Venous 1.4 0.5 - 1.9 mmol/L      Assessment & Plan:   Problem List Items Addressed This Visit       Other   History of DVT (deep vein thrombosis)    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  Will continue Eliquis 5 MG  BID for prevention and may warrant a visit to hematology in future for work-up to ensure no underlying cause for frequent DVTs.  At this time have recommended he hold Eliquis 3 days prior to surgery and if no heavy bleeding may restart in 24 hours.      Relevant Orders   CBC with Differential/Platelet   Comprehensive metabolic panel   Non-recurrent unilateral inguinal hernia without obstruction or gangrene - Primary    Scheduled for surgery 07/13/23.  Overall cleared by PCP.  At this time have recommended he hold Eliquis 3 days prior to surgery and if no heavy bleeding may restart in 24 hours. Obtain CBC, CMP, and UA.  EKG overall reassuring.  Cardiology has also cleared patient.      Relevant Orders   CBC with Differential/Platelet   Comprehensive metabolic panel   Urinalysis, Routine w reflex microscopic   Other Visit Diagnoses     Preoperative clearance       Scheduled for surgery 07/13/23.  Overall cleared by PCP.  At this time have recommended he hold Eliquis 3 days prior to surgery and restart 24 hours after.   Relevant Orders   EKG 12-Lead (Completed)   CBC with Differential/Platelet   Comprehensive metabolic panel   Urinalysis, Routine w reflex microscopic        Follow up plan: Return for ensure he has a future visit scheduled, if not should have one in 3 months.

## 2023-07-03 NOTE — Patient Instructions (Signed)
Hernia, Adult     A hernia happens when an organ or tissue inside your body pushes out through a weak spot in the muscles of your belly (abdomen). This makes a bulge. The bulge may be: In a scar from a surgery that was done in your belly (incisional hernia). Near your belly button (umbilical hernia). In your groin (inguinal hernia). Your groin is the area where your leg meets your lower belly. If you are a male, this type could also be in your scrotum. In your upper thigh (femoral hernia). Inside your belly (hiatal hernia). This happens when your stomach slides above the muscle between your belly and your chest (diaphragm). What are the causes? This condition may be caused by: Lifting heavy things. Coughing over a long period of time. Having trouble pooping (constipation). Trouble pooping can lead to straining. A cut from surgery in your belly. A physical problem that is present at birth. Being very overweight. Smoking. Too much fluid in your belly. A testicle that has not moved down into the scrotum, in males. What are the signs or symptoms? The main symptom is a bulge in the area of the hernia, but a bulge may not always be seen. It may grow bigger or be easier to see when you cough or strain (such as when lifting something heavy). A hernia that can be pushed back into the belly rarely causes pain. A hernia that cannot be pushed back into the belly may lose its blood supply. This may cause: Pain. Fever. A feeling like you may vomit, and vomiting. Swelling. Trouble pooping. How is this treated? A hernia that is small and painless may not need to be treated. A hernia that is large or painful may be treated with surgery. Surgery to treat a hernia involves pushing the bulge back into place and repairing the weak area of the muscle or belly. Follow these instructions at home: Activity Avoid straining the muscles near your hernia. This can happen when you: Lift something heavy. Poop  (have a bowel movement). Do not lift anything that is heavier than 10 lb (4.5 kg), or the limit that you are told. When you lift something heavy, use your leg muscles. Do not use your back muscles to lift. Prevent trouble pooping If told by your doctor, take steps to prevent trouble pooping. You may need to: Drink enough fluid to keep your pee (urine) pale yellow. Take medicines. You will be told what medicines to take. Eat foods that are high in fiber. These include beans, whole grains, and fresh fruits and vegetables. Limit foods that are high in fat and sugar. These include fried or sweet foods. General instructions When you cough, try to cough gently. You may try to push your hernia back in by gently pressing on it when you are lying down. Do not try to force the bulge back in if it will not go in easily. If you are overweight, work with your doctor to lose weight safely. Do not smoke or use any products that contain nicotine or tobacco. If you need help quitting, ask your doctor. If you will be having surgery, watch your hernia for changes in shape, size, or color. Tell your doctor if you see any changes. Take over-the-counter and prescription medicines only as told by your doctor. Keep all follow-up visits. Contact a doctor if: You get new pain, swelling, or redness near your hernia. You poop fewer times in a week than normal. You have trouble pooping. You have   poop that is more dry than normal. You have poop that is harder or larger than normal. Get help right away if: You have a fever or chills. You have belly pain that gets worse. You feel like you may vomit, or you vomit. Your hernia cannot be pushed in by gently pressing on it when you are lying down. Your hernia: Changes in shape or size. Changes color. Feels hard, or it hurts when you touch it. These symptoms may be an emergency. Get help right away. Call your local emergency services (911 in the [place] Do not wait to  see if the symptoms will go away. Do not drive yourself to the hospital. Summary A hernia happens when an organ or tissue inside your body pushes out through a weak spot in the belly muscles. This creates a bulge. If your hernia is small and it does not hurt, you may not need treatment. If your hernia is large or it hurts, you may need surgery. If you will be having surgery, watch your hernia for changes in shape, size, or color. Tell your doctor about any changes. This information is not intended to replace advice given to you by your health care provider. Make sure you discuss any questions you have with your health care provider. Document Revised: 04/02/2020 Document Reviewed: 04/02/2020 Elsevier Patient Education  2024 Elsevier Inc.  

## 2023-07-04 LAB — COMPREHENSIVE METABOLIC PANEL
ALT: 31 [IU]/L (ref 0–44)
AST: 24 [IU]/L (ref 0–40)
Albumin: 4.3 g/dL (ref 3.8–4.9)
Alkaline Phosphatase: 84 [IU]/L (ref 44–121)
BUN/Creatinine Ratio: 16 (ref 10–24)
BUN: 18 mg/dL (ref 8–27)
Bilirubin Total: 0.5 mg/dL (ref 0.0–1.2)
CO2: 26 mmol/L (ref 20–29)
Calcium: 9.7 mg/dL (ref 8.6–10.2)
Chloride: 102 mmol/L (ref 96–106)
Creatinine, Ser: 1.13 mg/dL (ref 0.76–1.27)
Globulin, Total: 2.7 g/dL (ref 1.5–4.5)
Glucose: 138 mg/dL — ABNORMAL HIGH (ref 70–99)
Potassium: 3.7 mmol/L (ref 3.5–5.2)
Sodium: 141 mmol/L (ref 134–144)
Total Protein: 7 g/dL (ref 6.0–8.5)
eGFR: 74 mL/min/{1.73_m2} (ref >59)

## 2023-07-04 LAB — URINALYSIS, ROUTINE W REFLEX MICROSCOPIC
Bilirubin, UA: NEGATIVE
Glucose, UA: NEGATIVE
Ketones, UA: NEGATIVE
Nitrite, UA: NEGATIVE
Protein,UA: NEGATIVE
RBC, UA: NEGATIVE
Specific Gravity, UA: >1.03 — ABNORMAL HIGH (ref 1.005–1.030)
Urobilinogen, Ur: 0.2 mg/dL (ref 0.2–1.0)
pH, UA: 5 (ref 5.0–7.5)

## 2023-07-04 LAB — MICROSCOPIC EXAMINATION
Bacteria, UA: NONE SEEN
RBC, Urine: NONE SEEN /[HPF] (ref 0–2)

## 2023-07-04 LAB — CBC WITH DIFFERENTIAL/PLATELET
Basophils Absolute: 0.1 10*3/uL (ref 0.0–0.2)
Basos: 1 %
EOS (ABSOLUTE): 0.3 10*3/uL (ref 0.0–0.4)
Eos: 3 %
Hematocrit: 47.7 % (ref 37.5–51.0)
Hemoglobin: 16 g/dL (ref 13.0–17.7)
Immature Grans (Abs): 0 10*3/uL (ref 0.0–0.1)
Immature Granulocytes: 0 %
Lymphocytes Absolute: 1.7 10*3/uL (ref 0.7–3.1)
Lymphs: 22 %
MCH: 31.1 pg (ref 26.6–33.0)
MCHC: 33.5 g/dL (ref 31.5–35.7)
MCV: 93 fL (ref 79–97)
Monocytes Absolute: 0.9 10*3/uL (ref 0.1–0.9)
Monocytes: 11 %
Neutrophils Absolute: 4.8 10*3/uL (ref 1.4–7.0)
Neutrophils: 63 %
Platelets: 223 10*3/uL (ref 150–450)
RBC: 5.14 x10E6/uL (ref 4.14–5.80)
RDW: 12.4 % (ref 11.6–15.4)
WBC: 7.7 10*3/uL (ref 3.4–10.8)

## 2023-07-06 ENCOUNTER — Ambulatory Visit: Payer: BC Managed Care – PPO | Admitting: Surgery

## 2023-07-06 ENCOUNTER — Encounter: Payer: Self-pay | Admitting: Surgery

## 2023-07-06 VITALS — BP 123/80 | HR 60 | Temp 98.1°F | Ht 67.0 in | Wt 201.2 lb

## 2023-07-06 DIAGNOSIS — K420 Umbilical hernia with obstruction, without gangrene: Secondary | ICD-10-CM | POA: Diagnosis not present

## 2023-07-06 DIAGNOSIS — K4090 Unilateral inguinal hernia, without obstruction or gangrene, not specified as recurrent: Secondary | ICD-10-CM

## 2023-07-06 NOTE — H&P (View-Only) (Signed)
07/06/2023  History of Present Illness: Jaime [name] is a 62 y.o. male presenting for follow-up of right inguinal hernia and incarcerated umbilical hernia.  The patient has been seen by both cardiology and his PCP and he has been cleared for surgery.  He presents today for H&P update.  He scheduled for surgery on 07/23/2023.  He reports that his right inguinal hernia is overall stable and does not feel like it has been bigger in size.  She did experience a recent fall had some more aggravation there but otherwise denies any worsening bulging or worsening pain.  The umbilical area remains stable as well without any symptoms.  Past Medical History: Past Medical History:  Diagnosis Date   Allergy    Clotting disorder (HCC)    GERD (gastroesophageal reflux disease)    History of heart attack 2012   Hyperlipidemia    Hypertension    Kidney stones      Past Surgical History: Past Surgical History:  Procedure Laterality Date   CORONARY ANGIOPLASTY WITH STENT PLACEMENT  2012   EXTRACORPOREAL SHOCK WAVE LITHOTRIPSY Left 01/30/2022   Procedure: EXTRACORPOREAL SHOCK WAVE LITHOTRIPSY (ESWL);  Surgeon: Riki Altes, MD;  Location: [facility] ORS;  Service: Urology;  Laterality: Left;   REPLACEMENT TOTAL KNEE BILATERAL Bilateral    2017 left knee, 2019 right knee   WRIST SURGERY Left    Carpel Tunnel    Home Medications: Prior to Admission medications   Medication Sig Start Date End Date Taking? Authorizing Provider  amLODipine (NORVASC) 10 MG tablet Take 1 tablet (10 mg total) by mouth daily. 02/06/22  Yes Agbor-Etang, Arlys John, MD  apixaban (ELIQUIS) 5 MG TABS tablet Take 1 tablet (5 mg total) by mouth 2 (two) times daily. 11/19/22  Yes Cannady, Jolene T, NP  Desoximetasone 0.25 % LIQD Apply to scalp daily for 2 weeks and then only use as needed for itchy scalp (no more then twice weekly). 07/04/22  Yes Cannady, Jolene T, NP  ezetimibe (ZETIA) 10 MG tablet Take 1 tablet (10 mg total) by mouth  daily. 07/04/22  Yes Cannady, Jolene T, NP  gabapentin (NEURONTIN) 300 MG capsule Take 300 mg by mouth 3 (three) times daily. 05/29/23  Yes [provider]  hydrALAZINE (APRESOLINE) 50 MG tablet Take 1 tablet (50 mg total) by mouth in the morning and at bedtime. 10/22/22 10/22/23 Yes Agbor-Etang, Arlys John, MD  hydrochlorothiazide (HYDRODIURIL) 25 MG tablet Take 1 tablet (25 mg total) by mouth daily. 01/05/23 04/05/24 Yes Cannady, Dorie Rank, NP  icosapent Ethyl (VASCEPA) 1 g capsule Take 2 capsules (2 g total) by mouth 2 (two) times daily. 01/05/23  Yes Cannady, Jolene T, NP  ketoconazole (NIZORAL) 2 % shampoo Apply 1 Application topically 2 (two) times a week. 07/07/22  Yes Cannady, Jolene T, NP  losartan (COZAAR) 100 MG tablet Take 1 tablet (100 mg total) by mouth daily. 01/05/23  Yes Cannady, Jolene T, NP  montelukast (SINGULAIR) 10 MG tablet Take 1 tablet (10 mg total) by mouth daily. 07/04/22  Yes Cannady, Jolene T, NP  nitroGLYCERIN (NITROSTAT) 0.4 MG SL tablet Place 1 tablet (0.4 mg total) under the tongue every 5 (five) minutes as needed for chest pain. For a maximum of 3 doses in 24 hours. 09/24/21  Yes Agbor-Etang, Arlys John, MD  pantoprazole (PROTONIX) 40 MG tablet Take 1 tablet (40 mg total) by mouth in the morning and at bedtime. Need appt for further refills 01/05/23  Yes Cannady, Jolene T, NP  ranolazine (RANEXA) 1000 MG  SR tablet Take 1 tablet (1,000 mg total) by mouth 2 (two) times daily. 01/05/23  Yes Cannady, Jolene T, NP  rosuvastatin (CRESTOR) 40 MG tablet Take 1 tablet (40 mg total) by mouth daily. 07/04/22  Yes Cannady, Jolene T, NP  scopolamine (TRANSDERM-SCOP) 1 MG/3DAYS Place 1 patch (1.5 mg total) onto the skin every 3 (three) days. 05/18/23  Yes Cannady, Jolene T, NP  tadalafil (CIALIS) 20 MG tablet Take 1 tablet (20 mg total) by mouth daily as needed for erectile dysfunction. 02/28/22  Yes McGowan, Wellington Hampshire, PA-C    Allergies: Allergies  Allergen Reactions   Codeine Other (See  Comments)    "Black outs"    Review of Systems: Review of Systems  Constitutional:  Negative for chills and fever.  Respiratory:  Negative for shortness of breath.   Cardiovascular:  Negative for chest pain.  Gastrointestinal:  Negative for abdominal pain, nausea and vomiting.    Physical Exam BP 123/80 (BP Location: Left Arm, Patient Position: Sitting, Cuff Size: Large)   Pulse 60   Temp 98.1 F (36.7 C) (Oral)   Ht 5\' 7"  (1.702 m)   Wt 201 lb 3.2 oz (91.3 kg)   SpO2 98%   BMI 31.51 kg/m  CONSTITUTIONAL: No acute distress, well-nourished HEENT:  Normocephalic, atraumatic, extraocular motion intact. RESPIRATORY:  Normal respiratory effort without pathologic use of accessory muscles. CARDIOVASCULAR: Regular rhythm and rate. GI: Exam deferred today. NEUROLOGIC:  Motor and sensation is grossly normal.  Cranial nerves are grossly intact. PSYCH:  Alert and oriented to person, place and time. Affect is normal.  Labs/Imaging: Labs from 07/03/2023: Sodium 141, potassium 3.7, chloride 102, CO2 26, BUN 18, creatinine 1.13.  LFTs within normal limits.  WBC 7.7, hemoglobin 16, hematocrit 47.7, platelets 223.  Assessment and Plan: This is a 62 y.o. male with a right inguinal hernia and incarcerated umbilical hernia.  - The patient currently scheduled for surgery on 07/23/2023.  He has been cleared both by cardiology and medical team.  He is on Eliquis and it is okay to hold it for surgery.  Discussed with the patient that given the date of surgery, his last dose of Eliquis will be on 07/20/2023.  This would give [place] 2 full days without Eliquis in his system.  Discussed with him again the plan for robotic assisted right inguinal hernia repair and open umbilical hernia repair.  Reviewed with him the surgery at length again including the planned incisions, the risks of bleeding, infection, injury to surrounding structures, I would be able to evaluate the left groin and have any hernias forming I  will be able to repair the same setting, the use of mesh, postoperative activity restrictions, pain control, and he is willing to proceed. - All of his questions have been answered.  I spent 20 minutes dedicated to the care of this patient on the date of this encounter to include pre-visit review of records, face-to-face time with the patient discussing diagnosis and management, and any post-visit coordination of care.   Howie Ill, MD [facility]

## 2023-07-06 NOTE — Patient Instructions (Signed)
You have requested for your Umbilical Hernia be repaired. This will be scheduled with Dr. Piscoya at [facility].   Please see your (blue)pre-care sheet for information. Our surgery scheduler will call you to verify surgery date and to go over information.   You will need to arrange to be off work for 1-2 weeks but will have to have a lifting restriction of no more than 15 lbs for 6 weeks following your surgery. If you have FMLA or disability paperwork that needs filled out you may drop this off at our office or this can be faxed to [phone].     Umbilical Hernia, Adult A hernia is a bulge of tissue that pushes through an opening between muscles. An umbilical hernia happens in the abdomen, near the belly button (umbilicus). The hernia may contain tissues from the small intestine, large intestine, or fatty tissue covering the intestines (omentum). Umbilical hernias in adults tend to get worse over time, and they require surgical treatment. There are several types of umbilical hernias. You may have: A hernia located just above or below the umbilicus (indirect hernia). This is the most common type of umbilical hernia in adults. A hernia that forms through an opening formed by the umbilicus (direct hernia). A hernia that comes and goes (reducible hernia). A reducible hernia may be visible only when you strain, lift something heavy, or cough. This type of hernia can be pushed back into the abdomen (reduced). A hernia that traps abdominal tissue inside the hernia (incarcerated hernia). This type of hernia cannot be reduced. A hernia that cuts off blood flow to the tissues inside the hernia (strangulated hernia). The tissues can start to die if this happens. This type of hernia requires emergency treatment.  What are the causes? An umbilical hernia happens when tissue inside the abdomen presses on a weak area of the abdominal muscles. What increases the risk? You may have a  greater risk of this condition if you: Are obese. Have had several pregnancies. Have a buildup of fluid inside your abdomen (ascites). Have had surgery that weakens the abdominal muscles.  What are the signs or symptoms? The main symptom of this condition is a painless bulge at or near the belly button. A reducible hernia may be visible only when you strain, lift something heavy, or cough. Other symptoms may include: Dull pain. A feeling of pressure.  Symptoms of a strangulated hernia may include: Pain that gets increasingly worse. Nausea and vomiting. Pain when pressing on the hernia. Skin over the hernia becoming red or purple. Constipation. Blood in the stool.  How is this diagnosed? This condition may be diagnosed based on: A physical exam. You may be asked to cough or strain while standing. These actions increase the pressure inside your abdomen and force the hernia through the opening in your muscles. Your health care provider may try to reduce the hernia by pressing on it. Your symptoms and medical history.  How is this treated? Surgery is the only treatment for an umbilical hernia. Surgery for a strangulated hernia is done as soon as possible. If you have a small hernia that is not incarcerated, you may need to lose weight before having surgery. Follow these instructions at home: Lose weight, if told by your health care provider. Do not try to push the hernia back in. Watch your hernia for any changes in color or size. Tell your health care provider if any changes occur. You may need to avoid activities   that increase pressure on your hernia. Do not lift anything that is heavier than 10 lb (4.5 kg) until your health care provider says that this is safe. Take over-the-counter and prescription medicines only as told by your health care provider. Keep all follow-up visits as told by your health care provider. This is important. Contact a health care provider if: Your hernia  gets larger. Your hernia becomes painful. Get help right away if: You develop sudden, severe pain near the area of your hernia. You have pain as well as nausea or vomiting. You have pain and the skin over your hernia changes color. You develop a fever. This information is not intended to replace advice given to you by your health care provider. Make sure you discuss any questions you have with your health care provider. Document Released: 01/25/2016 Document Revised: 04/27/2016 Document Reviewed: 01/25/2016 Elsevier Interactive Patient Education  2018 Elsevier Inc.    

## 2023-07-06 NOTE — Progress Notes (Signed)
07/06/2023  History of Present Illness: Jaime [name] is a 62 y.o. male presenting for follow-up of right inguinal hernia and incarcerated umbilical hernia.  The patient has been seen by both cardiology and his PCP and he has been cleared for surgery.  He presents today for H&P update.  He scheduled for surgery on 07/23/2023.  He reports that his right inguinal hernia is overall stable and does not feel like it has been bigger in size.  She did experience a recent fall had some more aggravation there but otherwise denies any worsening bulging or worsening pain.  The umbilical area remains stable as well without any symptoms.  Past Medical History: Past Medical History:  Diagnosis Date   Allergy    Clotting disorder (HCC)    GERD (gastroesophageal reflux disease)    History of heart attack 2012   Hyperlipidemia    Hypertension    Kidney stones      Past Surgical History: Past Surgical History:  Procedure Laterality Date   CORONARY ANGIOPLASTY WITH STENT PLACEMENT  2012   EXTRACORPOREAL SHOCK WAVE LITHOTRIPSY Left 01/30/2022   Procedure: EXTRACORPOREAL SHOCK WAVE LITHOTRIPSY (ESWL);  Surgeon: Riki Altes, MD;  Location: [facility] ORS;  Service: Urology;  Laterality: Left;   REPLACEMENT TOTAL KNEE BILATERAL Bilateral    2017 left knee, 2019 right knee   WRIST SURGERY Left    Carpel Tunnel    Home Medications: Prior to Admission medications   Medication Sig Start Date End Date Taking? Authorizing Provider  amLODipine (NORVASC) 10 MG tablet Take 1 tablet (10 mg total) by mouth daily. 02/06/22  Yes Agbor-Etang, Arlys John, MD  apixaban (ELIQUIS) 5 MG TABS tablet Take 1 tablet (5 mg total) by mouth 2 (two) times daily. 11/19/22  Yes Cannady, Jolene T, NP  Desoximetasone 0.25 % LIQD Apply to scalp daily for 2 weeks and then only use as needed for itchy scalp (no more then twice weekly). 07/04/22  Yes Cannady, Jolene T, NP  ezetimibe (ZETIA) 10 MG tablet Take 1 tablet (10 mg total) by mouth  daily. 07/04/22  Yes Cannady, Jolene T, NP  gabapentin (NEURONTIN) 300 MG capsule Take 300 mg by mouth 3 (three) times daily. 05/29/23  Yes [provider]  hydrALAZINE (APRESOLINE) 50 MG tablet Take 1 tablet (50 mg total) by mouth in the morning and at bedtime. 10/22/22 10/22/23 Yes Agbor-Etang, Arlys John, MD  hydrochlorothiazide (HYDRODIURIL) 25 MG tablet Take 1 tablet (25 mg total) by mouth daily. 01/05/23 04/05/24 Yes Cannady, Dorie Rank, NP  icosapent Ethyl (VASCEPA) 1 g capsule Take 2 capsules (2 g total) by mouth 2 (two) times daily. 01/05/23  Yes Cannady, Jolene T, NP  ketoconazole (NIZORAL) 2 % shampoo Apply 1 Application topically 2 (two) times a week. 07/07/22  Yes Cannady, Jolene T, NP  losartan (COZAAR) 100 MG tablet Take 1 tablet (100 mg total) by mouth daily. 01/05/23  Yes Cannady, Jolene T, NP  montelukast (SINGULAIR) 10 MG tablet Take 1 tablet (10 mg total) by mouth daily. 07/04/22  Yes Cannady, Jolene T, NP  nitroGLYCERIN (NITROSTAT) 0.4 MG SL tablet Place 1 tablet (0.4 mg total) under the tongue every 5 (five) minutes as needed for chest pain. For a maximum of 3 doses in 24 hours. 09/24/21  Yes Agbor-Etang, Arlys John, MD  pantoprazole (PROTONIX) 40 MG tablet Take 1 tablet (40 mg total) by mouth in the morning and at bedtime. Need appt for further refills 01/05/23  Yes Cannady, Jolene T, NP  ranolazine (RANEXA) 1000 MG  SR tablet Take 1 tablet (1,000 mg total) by mouth 2 (two) times daily. 01/05/23  Yes Cannady, Jolene T, NP  rosuvastatin (CRESTOR) 40 MG tablet Take 1 tablet (40 mg total) by mouth daily. 07/04/22  Yes Cannady, Jolene T, NP  scopolamine (TRANSDERM-SCOP) 1 MG/3DAYS Place 1 patch (1.5 mg total) onto the skin every 3 (three) days. 05/18/23  Yes Cannady, Jolene T, NP  tadalafil (CIALIS) 20 MG tablet Take 1 tablet (20 mg total) by mouth daily as needed for erectile dysfunction. 02/28/22  Yes McGowan, Wellington Hampshire, PA-C    Allergies: Allergies  Allergen Reactions   Codeine Other (See  Comments)    "Black outs"    Review of Systems: Review of Systems  Constitutional:  Negative for chills and fever.  Respiratory:  Negative for shortness of breath.   Cardiovascular:  Negative for chest pain.  Gastrointestinal:  Negative for abdominal pain, nausea and vomiting.    Physical Exam BP 123/80 (BP Location: Left Arm, Patient Position: Sitting, Cuff Size: Large)   Pulse 60   Temp 98.1 F (36.7 C) (Oral)   Ht 5\' 7"  (1.702 m)   Wt 201 lb 3.2 oz (91.3 kg)   SpO2 98%   BMI 31.51 kg/m  CONSTITUTIONAL: No acute distress, well-nourished HEENT:  Normocephalic, atraumatic, extraocular motion intact. RESPIRATORY:  Normal respiratory effort without pathologic use of accessory muscles. CARDIOVASCULAR: Regular rhythm and rate. GI: Exam deferred today. NEUROLOGIC:  Motor and sensation is grossly normal.  Cranial nerves are grossly intact. PSYCH:  Alert and oriented to person, place and time. Affect is normal.  Labs/Imaging: Labs from 07/03/2023: Sodium 141, potassium 3.7, chloride 102, CO2 26, BUN 18, creatinine 1.13.  LFTs within normal limits.  WBC 7.7, hemoglobin 16, hematocrit 47.7, platelets 223.  Assessment and Plan: This is a 62 y.o. male with a right inguinal hernia and incarcerated umbilical hernia.  - The patient currently scheduled for surgery on 07/23/2023.  He has been cleared both by cardiology and medical team.  He is on Eliquis and it is okay to hold it for surgery.  Discussed with the patient that given the date of surgery, his last dose of Eliquis will be on 07/20/2023.  This would give [place] 2 full days without Eliquis in his system.  Discussed with him again the plan for robotic assisted right inguinal hernia repair and open umbilical hernia repair.  Reviewed with him the surgery at length again including the planned incisions, the risks of bleeding, infection, injury to surrounding structures, I would be able to evaluate the left groin and have any hernias forming I  will be able to repair the same setting, the use of mesh, postoperative activity restrictions, pain control, and he is willing to proceed. - All of his questions have been answered.  I spent 20 minutes dedicated to the care of this patient on the date of this encounter to include pre-visit review of records, face-to-face time with the patient discussing diagnosis and management, and any post-visit coordination of care.   Howie Ill, MD [facility]

## 2023-07-06 NOTE — Progress Notes (Unsigned)
Medical Clearance received from [facility], the patient is cleared at Medium risk for surgery. May hold Eliquis up to 3 days before surgery.   Cardiology clearance has been received. All notes are in Epic.

## 2023-07-07 ENCOUNTER — Ambulatory Visit: Payer: BC Managed Care – PPO | Admitting: Nurse Practitioner

## 2023-07-15 ENCOUNTER — Other Ambulatory Visit: Payer: BC Managed Care – PPO

## 2023-07-20 ENCOUNTER — Encounter
Admission: RE | Admit: 2023-07-20 | Discharge: 2023-07-20 | Disposition: A | Discharge status: Home / Self Care | Payer: BC Managed Care – PPO | Source: Ambulatory Visit | Attending: Surgery | Admitting: Surgery

## 2023-07-20 ENCOUNTER — Ambulatory Visit: Payer: BC Managed Care – PPO | Admitting: Family Medicine

## 2023-07-20 HISTORY — DX: Presence of coronary angioplasty implant and graft: Z95.5

## 2023-07-20 HISTORY — DX: Unilateral inguinal hernia, without obstruction or gangrene, not specified as recurrent: K40.90

## 2023-07-20 HISTORY — DX: Atherosclerotic heart disease of native coronary artery without angina pectoris: I25.10

## 2023-07-20 HISTORY — DX: Male erectile dysfunction, unspecified: N52.9

## 2023-07-20 HISTORY — DX: Bradycardia, unspecified: R00.1

## 2023-07-20 HISTORY — DX: Benign prostatic hyperplasia without lower urinary tract symptoms: N40.0

## 2023-07-20 HISTORY — DX: Umbilical hernia without obstruction or gangrene: K42.9

## 2023-07-20 HISTORY — DX: Seborrheic dermatitis, unspecified: L21.9

## 2023-07-20 HISTORY — DX: Other abnormal glucose: R73.09

## 2023-07-20 NOTE — Patient Instructions (Addendum)
Your procedure is scheduled on:07-23-23 Thursday Report to the Registration Desk on the 1st floor of the [facility].Then proceed to the 2nd floor Surgery Desk To find out your arrival time, please call [phone] between 1PM - 3PM on:07-22-23 Wednesday If your arrival time is 6:00 am, do not arrive before that time as the [facility] entrance doors do not open until 6:00 am.  REMEMBER: Instructions that are not followed completely may result in serious medical risk, up to and including death; or upon the discretion of your surgeon and anesthesiologist your surgery may need to be rescheduled.  Do not eat food after midnight the night before surgery.  No gum chewing or hard candies.  You may however, drink CLEAR liquids up to 2 hours before you are scheduled to arrive for your surgery. Do not drink anything within 2 hours of your scheduled arrival time.  Clear liquids include: - water  - apple juice without pulp - gatorade (not RED colors) - black coffee or tea (Do NOT add milk or creamers to the coffee or tea) Do NOT drink anything that is not on this list..  One week prior to surgery:Stop NOW (07-20-23) Stop Anti-inflammatories (NSAIDS) such as Advil, Aleve, Ibuprofen, Motrin, Naproxen, Naprosyn and Aspirin based products such as Excedrin, Goody's Powder, BC Powder. Stop ANY OVER THE COUNTER supplements until after surgery.  You may however, continue to take Tylenol if needed for pain up until the day of surgery.  Stop apixaban (ELIQUIS) 3 days prior to surgery-Last dose was on 07-18-23   Stop tadalafil (CIALIS) 2 days prior to surgery-Last dose will be today (07-20-23)  Continue taking all of your other prescription medications up until the day of surgery.  ON THE DAY OF SURGERY ONLY TAKE THESE MEDICATIONS WITH SIPS OF WATER: -gabapentin (NEURONTIN)  -hydrALAZINE (APRESOLINE)  -pantoprazole (PROTONIX)  -ranolazine (RANEXA)  -rosuvastatin (CRESTOR)   No Alcohol for 24  hours before or after surgery.  No Smoking including e-cigarettes for 24 hours before surgery.  No chewable tobacco products for at least 6 hours before surgery.  No nicotine patches on the day of surgery.  Do not use any "recreational" drugs for at least a week (preferably 2 weeks) before your surgery.  Please be advised that the combination of cocaine and anesthesia may have negative outcomes, up to and including death. If you test positive for cocaine, your surgery will be cancelled.  On the morning of surgery brush your teeth with toothpaste and water, you may rinse your mouth with mouthwash if you wish. Do not swallow any toothpaste or mouthwash.  Use CHG Soap as directed on instruction sheet.  Do not wear jewelry, make-up, hairpins, clips or nail polish.  For welded (permanent) jewelry: bracelets, anklets, waist bands, etc.  Please have this removed prior to surgery.  If it is not removed, there is a chance that hospital personnel will need to cut it off on the day of surgery.  Do not wear lotions, powders, or perfumes.   Do not shave body hair from the neck down 48 hours before surgery.  Contact lenses, hearing aids and dentures may not be worn into surgery.  Do not bring valuables to the hospital. [facility] is not responsible for any missing/lost belongings or valuables.   Notify your doctor if there is any change in your medical condition (cold, fever, infection).  Wear comfortable clothing (specific to your surgery type) to the hospital.  After surgery, you can help prevent lung complications  by doing breathing exercises.  Take deep breaths and cough every 1-2 hours. Your doctor may order a device called an Incentive Spirometer to help you take deep breaths. When coughing or sneezing, hold a pillow firmly against your incision with both hands. This is called "splinting." Doing this helps protect your incision. It also decreases belly discomfort.  If you are being  admitted to the hospital overnight, leave your suitcase in the car. After surgery it may be brought to your room.  In case of increased patient census, it may be necessary for you, the patient, to continue your postoperative care in the Same Day Surgery department.  If you are being discharged the day of surgery, you will not be allowed to drive home. You will need a responsible individual to drive you home and stay with you for 24 hours after surgery.   If you are taking public transportation, you will need to have a responsible individual with you.  Please call the Pre-admissions Testing Dept. at [phone] if you have any questions about these instructions.  Surgery Visitation Policy:  Patients having surgery or a procedure may have two visitors.  Children under the age of 44 must have an adult with them who is not the patient.     Preparing for Surgery with CHLORHEXIDINE GLUCONATE (CHG) Soap  Chlorhexidine Gluconate (CHG) Soap  o An antiseptic cleaner that kills germs and bonds with the skin to continue killing germs even after washing  o Used for showering the night before surgery and morning of surgery  Before surgery, you can play an important role by reducing the number of germs on your skin.  CHG (Chlorhexidine gluconate) soap is an antiseptic cleanser which kills germs and bonds with the skin to continue killing germs even after washing.  Please do not use if you have an allergy to CHG or antibacterial soaps. If your skin becomes reddened/irritated stop using the CHG.  1. Shower the NIGHT BEFORE SURGERY and the MORNING OF SURGERY with CHG soap.  2. If you choose to wash your hair, wash your hair first as usual with your normal shampoo.  3. After shampooing, rinse your hair and body thoroughly to remove the shampoo.  4. Use CHG as you would any other liquid soap. You can apply CHG directly to the skin and wash gently with a scrungie or a clean washcloth.  5. Apply  the CHG soap to your body only from the neck down. Do not use on open wounds or open sores. Avoid contact with your eyes, ears, mouth, and genitals (private parts). Wash face and genitals (private parts) with your normal soap.  6. Wash thoroughly, paying special attention to the area where your surgery will be performed.  7. Thoroughly rinse your body with warm water.  8. Do not shower/wash with your normal soap after using and rinsing off the CHG soap.  9. Pat yourself dry with a clean towel.  10. Wear clean pajamas to bed the night before surgery.  12. Place clean sheets on your bed the night of your first shower and do not sleep with pets.  13. Shower again with the CHG soap on the day of surgery prior to arriving at the hospital.  14. Do not apply any deodorants/lotions/powders.  15. Please wear clean clothes to the hospital.

## 2023-07-21 ENCOUNTER — Encounter: Payer: Self-pay | Admitting: Surgery

## 2023-07-21 ENCOUNTER — Ambulatory Visit: Payer: BC Managed Care – PPO | Admitting: Nurse Practitioner

## 2023-07-21 NOTE — Progress Notes (Signed)
Perioperative / Anesthesia Services  Pre-Admission Testing Clinical Review / Pre-Operative Anesthesia Consult  Date: 07/22/23  Patient Demographics:  Name: Jaime [name] DOB:   02/05/1961 MRN:   [id number]  Planned Surgical Procedure(s):    Case: [id number] Date/Time: 07/23/23 0715   Procedures:      XI ROBOTIC ASSISTED INGUINAL HERNIA (Right)     HERNIA REPAIR UMBILICAL ADULT, open   Anesthesia type: General   Pre-op diagnosis: right inguinal hernia, incarcerated umbilical hernia less 3 cm   Location: [facility] OR ROOM 04 / [facility]   Surgeons: Henrene Dodge, MD     NOTE: Available PAT nursing documentation and vital signs have been reviewed. Clinical nursing staff has updated patient's PMH/[name], current medication list, and drug allergies/intolerances to ensure comprehensive history available to assist in medical decision making as it pertains to the aforementioned surgical procedure and anticipated anesthetic course. Extensive review of available clinical information personally performed. Jaime [name] updated with any diagnoses/procedures that  may have been inadvertently omitted during his intake with the pre-admission testing department's nursing staff.  Clinical Discussion:  Jaime [name] is a 62 y.o. male who is submitted for pre-surgical anesthesia review and clearance prior to him undergoing the above procedure. Patient has never been a smoker. Pertinent PMH includes: CAD, MI, bradycardia, ascending aortic dilatation, multiple DVTs, aortic atherosclerosis, HTN, HLD, GERD (on daily PPI), RIGHT inguinal hernia, incarcerated umbilical hernia, BPH, nephrolithiasis, ED (on PDE5i).  Patient is followed by cardiology Jaime Cecil, MD). He was last seen in the cardiology clinic on 01/20/2023; notes reviewed. At the time of his clinic visit, patient doing well overall from a cardiovascular perspective. Patient denied any chest pain, shortness of breath, PND,  orthopnea, palpitations, significant peripheral edema, weakness, fatigue, vertiginous symptoms, or presyncope/syncope. Patient with a past medical history significant for cardiovascular diagnoses. Documented physical exam was grossly benign, providing no evidence of acute exacerbation and/or decompensation of the patient's known cardiovascular conditions.  Of note, complete records regarding patient's cardiovascular history unavailable for review at time of consult, as patient was treated in the state of [place].  Information gathered from patient report and from notes provided by his local cardiologist.  Patient reported to have suffered a MI in 2012 (unknown type).  He subsequently underwent PCI and stenting (unknown type) of the mid LAD.  Patient underwent diagnostic LEFT heart catheterization on 10/01/2013 revealing mild single-vessel CAD.  There was a 40-50% lesion noted to the proximal RCA.  Previously placed stent to the mid LAD noted to be widely patent.  Given the nonobstructive nature of his coronary artery disease, the decision was made to defer further intervention opting for medical management.  Repeat diagnostic LEFT heart catheterization was performed on 10/22/2015 revealing multivessel CAD; 30% proximal LAD and 30-40% proximal RCA.  There was 10% ISR noted to the previously placed mid LAD stent.  Again, further intervention was deferred opting for medical management.  Most recent TTE was performed on 09/18/2021 revealing normal left ventricular systolic function with an EF of 55-60%.  They were no regional wall motion abnormalities.  Right ventricular size and function normal.  There was trivial mitral and mild aortic valve regurgitation.  All transvalvular gradients were noted to be normal providing no evidence suggestive of valvular stenosis.  There was mild dilatation of the ascending aorta measuring up to 39 mm.  Patient with a history of recurrent DVT.  Patient has been seen in consult  by local hematologist Cathie Hoops, MD); notes reviewed.  Hypercoagulable workup was unremarkable; see below.  Hematologist advised that patient did not require lifelong anticoagulation.  With that said, patient advising that he was told in [place] that he would require oral anticoagulation therapy for the rest of his life.  He reports DVT formation off of anticoagulation therapy.  Current PCP and patient have made the decision to continue lifelong anticoagulation per his report.  Protein S Ag, total = 101% (60-150%) Protein S Ag, free = 102% (61-136%) Protein C activity = 134% (73-180%) Antithrombin III = 95% (75-120%) Factor V mutation - c.1601G>A (p.Arg534Gln) - not detected  Prothrombin gene mutation - c.*97G>A - not detected  Anticardiolipin IgG = < 9 GPL U/mL (0-14 GPL U/mL) Anticardiolipin IgM = < 9 GPL U/mL (0-14 GPL U/mL) PTT lupus anticoagulant = 30.5 seconds (0-51.9 seconds) DRVVT = 30.9 seconds (0-47 seconds)  Again, patient remains on daily oral anticoagulation therapy for recurrent DVT.  He is reportedly compliant with therapy with no evidence or reports of GI/GU related bleeding.  Blood pressure reasonably controlled at 138/80 mmHg on currently prescribed vasodilator (hydralazine), diuretic (hydrochlorothiazide), and ARB (losartan) therapies.  Patient is on ranolazine for prevention of anginal symptoms.  Additionally he has a supply of short acting nitrates (NTG) to use for anginal symptoms not managed by his prescribed schedule Ranexa; denied recent use.  Patient is on rosuvastatin + icosapent ethyl  + for his HLD diagnosis and ASCVD prevention.  Patient is not diabetic.  He does not have an OSAH diagnosis. Patient is able to complete all of his  ADL/IADLs without cardiovascular limitation.  Per the DASI, patient is able to achieve at least 4 METS of physical activity without experiencing any significant degree of angina/anginal equivalent symptoms.  No changes were made to his medication  regimen.  Patient to follow-up with outpatient cardiology in 6 months or sooner if needed.  Jaime [name] is scheduled for an elective XI ROBOTIC ASSISTED INGUINAL HERNIA REPAIR (RIGHT); UMBILICAL HERNIA REPAIR on 07/23/2023 with Dr. Ernesto Rutherford, MD.  Given patient's past medical history significant for cardiovascular diagnoses, presurgical cardiac clearance was sought by the PAT team.  Per cardiology, "according to the Revised Cardiac Risk Index (RCRI), his Perioperative Risk of Major Cardiac Event is (%): 0.4. His Functional Capacity in METs is: 7.25 according to the [employer] Activity Status Index (DASI).  Limitations are concerning his abdominal hernia as far as heavy lifting, or significantly strenuous exercise causing him to have to lift objects greater than 10 pounds. Therefore, based on ACC/AHA guidelines, patient would be at ACCEPTABLE risk for the planned procedure without further cardiovascular testing".  Again, this patient is on daily oral anticoagulation therapy using a DOAC medication. He has been instructed on recommendations for holding his apixaban for 3 days prior to his procedure with plans to restart as soon as postoperative bleeding risk felt to be minimized by his attending surgeon. The patient has been instructed that his last dose of his apixaban should be on 07/19/2023.  In review of his medication reconciliation, the patient is not noted to be taking any type of anticoagulation or antiplatelet therapies that would need to be held during his perioperative course.     07/06/2023    8:39 AM 07/03/2023    3:21 PM 06/03/2023    9:00 PM  Vitals with BMI  Height 5\' 7"  5\' 7"    Weight 201 lbs 3 oz 198 lbs 3 oz   BMI 31.51 31.04   Systolic 123 119 034  Diastolic 80  80 82  Pulse 60 84 78    Providers/Specialists:   NOTE: Primary physician provider listed below. Patient may have been seen by APP or partner within same practice.   PROVIDER ROLE / SPECIALTY LAST Eligha Bridegroom,  MD General Surgery (Surgeon) 07/06/2023  Marjie Skiff, NP Primary Care Provider 07/03/2023  Debbe Odea, MD Cardiology 01/20/2023; update call with preop APP on 06/11/2023  Rickard Patience, MD Hematology 07/23/2021   Allergies:  Codeine  Current Home Medications:   No current facility-administered medications for this encounter.    amLODipine (NORVASC) 10 MG tablet   apixaban (ELIQUIS) 5 MG TABS tablet   Desoximetasone 0.25 % LIQD   ezetimibe (ZETIA) 10 MG tablet   gabapentin (NEURONTIN) 300 MG capsule   hydrALAZINE (APRESOLINE) 50 MG tablet   hydrochlorothiazide (HYDRODIURIL) 25 MG tablet   icosapent Ethyl (VASCEPA) 1 g capsule   ketoconazole (NIZORAL) 2 % shampoo   losartan (COZAAR) 100 MG tablet   montelukast (SINGULAIR) 10 MG tablet   nitroGLYCERIN (NITROSTAT) 0.4 MG SL tablet   pantoprazole (PROTONIX) 40 MG tablet   ranolazine (RANEXA) 1000 MG SR tablet   rosuvastatin (CRESTOR) 40 MG tablet   scopolamine (TRANSDERM-SCOP) 1 MG/3DAYS   tadalafil (CIALIS) 20 MG tablet   History:   Past Medical History:  Diagnosis Date   Allergy    Aortic atherosclerosis (HCC)    Ascending aorta dilatation (HCC)    a.) TTE 09/18/2021: asc Ao 39 mm   BPH (benign prostatic hyperplasia)    Bradycardia    Coronary artery disease 2012   a.) MI (unknown type) 2012 --> PCI placing stent (unknown type) to mLAD; b.) LHC 10/01/2013: 40-50% pRCA - med mgmt; c.) LHC 10/22/2015: 30% pLAD, 10% ISR mLAD, 30-40% pRCA - med mgmt   ED (erectile dysfunction)    a.) on PDE5i (tadalafil)   GERD (gastroesophageal reflux disease)    Hepatic steatosis    History of 2019 novel coronavirus disease (COVID-19) 04/27/2020   History of echocardiogram    a.) TTE 09/18/2021: EF 55-60%, no RWMAs, normal RVSF, triv MR, mild AR, asc Ao 39 mm   Hyperlipidemia    Hypertension    Incarcerated umbilical hernia    Kidney stones    Left carpal tunnel syndrome    a.) s/p release   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)    a.) last was in 2021; provoked in setting of surgery/hospitalization and concurrent SARS-CoV-2; b.) evaluated by hematology 06/2021; hypercoagulable work up NEGATIVE (antiphospholipid syndrome panel (-), normal protein S antigen, normal protein C activity, normal ATIII, Factor V Leiden mutation (-), prothrombin gene mutation (-)   Myocardial infarction (HCC) 2012   a.) details unclear; Tx'd in [place], [place] --> PCI of mLAD (unknown type stent)   On apixaban therapy    a.) hematology advised no need for lifelong anticoagulation (06/2021); patient notes that he was told  in [facility] that lifelong OAC would be required; (+) DVT formation off of OAC in the past per his report; patient and PCP have elected to continue therapy   Right inguinal hernia    Seborrheic dermatitis    Past Surgical History:  Procedure Laterality Date   CORONARY ANGIOPLASTY WITH STENT PLACEMENT  2012   EXTRACORPOREAL SHOCK WAVE LITHOTRIPSY Left 01/30/2022   Procedure: EXTRACORPOREAL SHOCK WAVE LITHOTRIPSY (ESWL);  Surgeon: Riki Altes, MD;  Location: [facility] ORS;  Service: Urology;  Laterality: Left;   REPLACEMENT TOTAL KNEE BILATERAL Bilateral    2017  left knee, 2019 right knee   WRIST SURGERY Left    Carpel Tunnel   Family History  Problem Relation Age of Onset   Pancreatic cancer Mother    Cancer Father    Heart attack Paternal Aunt    Social History   Tobacco Use   Smoking status: Never   Smokeless tobacco: Never  Vaping Use   Vaping status: Never Used  Substance Use Topics   Alcohol use: Yes    Comment: RARE Beer- 1 to 2 a month   Drug use: Never    Pertinent Clinical Results:  LABS:   No visits with results within 3 Day(s) from this visit.  Latest known visit with results is:  Office Visit on 07/03/2023  Component Date Value Ref Range Status   WBC 07/03/2023 7.7  3.4 - 10.8 x10E3/uL Final   RBC 07/03/2023 5.14  4.14 - 5.80 x10E6/uL Final   Hemoglobin 07/03/2023 16.0  13.0 - 17.7  g/dL Final   Hematocrit 96/29/5284 47.7  37.5 - 51.0 % Final   MCV 07/03/2023 93  79 - 97 fL Final   MCH 07/03/2023 31.1  26.6 - 33.0 pg Final   MCHC 07/03/2023 33.5  31.5 - 35.7 g/dL Final   RDW 13/24/4010 12.4  11.6 - 15.4 % Final   Platelets 07/03/2023 223  150 - 450 x10E3/uL Final   Neutrophils 07/03/2023 63  Not Estab. % Final   Lymphs 07/03/2023 22  Not Estab. % Final   Monocytes 07/03/2023 11  Not Estab. % Final   Eos 07/03/2023 3  Not Estab. % Final   Basos 07/03/2023 1  Not Estab. % Final   Neutrophils Absolute 07/03/2023 4.8  1.4 - 7.0 x10E3/uL Final   Lymphocytes Absolute 07/03/2023 1.7  0.7 - 3.1 x10E3/uL Final   Monocytes Absolute 07/03/2023 0.9  0.1 - 0.9 x10E3/uL Final   EOS (ABSOLUTE) 07/03/2023 0.3  0.0 - 0.4 x10E3/uL Final   Basophils Absolute 07/03/2023 0.1  0.0 - 0.2 x10E3/uL Final   Immature Granulocytes 07/03/2023 0  Not Estab. % Final   Immature Grans (Abs) 07/03/2023 0.0  0.0 - 0.1 x10E3/uL Final   Glucose 07/03/2023 138 (H)  70 - 99 mg/dL Final   BUN 27/25/3664 18  8 - 27 mg/dL Final   Creatinine, Ser 07/03/2023 1.13  0.76 - 1.27 mg/dL Final   eGFR 40/34/7425 74  >59 mL/min/1.73 Final   BUN/Creatinine Ratio 07/03/2023 16  10 - 24 Final   Sodium 07/03/2023 141  134 - 144 mmol/L Final   Potassium 07/03/2023 3.7  3.5 - 5.2 mmol/L Final   Chloride 07/03/2023 102  96 - 106 mmol/L Final   CO2 07/03/2023 26  20 - 29 mmol/L Final   Calcium 07/03/2023 9.7  8.6 - 10.2 mg/dL Final   Total Protein 95/63/8756 7.0  6.0 - 8.5 g/dL Final   Albumin 43/32/9518 4.3  3.8 - 4.9 g/dL Final   Globulin, Total 07/03/2023 2.7  1.5 - 4.5 g/dL Final   Bilirubin Total 07/03/2023 0.5  0.0 - 1.2 mg/dL Final   Alkaline Phosphatase 07/03/2023 84  44 - 121 IU/L Final   AST 07/03/2023 24  0 - 40 IU/L Final   ALT 07/03/2023 31  0 - 44 IU/L Final   Specific Gravity, UA 07/03/2023 >1.030 (H)  1.005 - 1.030 Final   pH, UA 07/03/2023 5.0  5.0 - 7.5 Final   Color, UA 07/03/2023 Yellow  Yellow  Final   Appearance Ur 07/03/2023 Clear  Clear Final   Leukocytes,UA 07/03/2023 Trace (A)  Negative Final   Protein,UA 07/03/2023 Negative  Negative/Trace Final   Glucose, UA 07/03/2023 Negative  Negative Final   Ketones, UA 07/03/2023 Negative  Negative Final   RBC, UA 07/03/2023 Negative  Negative Final   Bilirubin, UA 07/03/2023 Negative  Negative Final   Urobilinogen, Ur 07/03/2023 0.2  0.2 - 1.0 mg/dL Final   Nitrite, UA 86/57/8469 Negative  Negative Final   Microscopic Examination 07/03/2023 See below:   Final   WBC, UA 07/03/2023 0-5  0 - 5 /hpf Final   RBC, Urine 07/03/2023 None seen  0 - 2 /hpf Final   Epithelial Cells (non renal) 07/03/2023 0-10  0 - 10 /hpf Final   Bacteria, UA 07/03/2023 None seen  None seen/Few Final    ECG: Date: 07/03/2023 Rate: 79 bpm Rhythm: normal sinus Axis (leads I and aVF): Normal Intervals: PR 162 ms. QRS 102 ms. QTc 441 ms. ST segment and T wave changes: Nonspecific T wave abnormalities; diffuse artifact Comparison: Similar to previous tracing obtained on 01/20/2023   IMAGING / PROCEDURES: CT ABDOMEN PELVIS W CONTRAST performed on 06/03/2023 No acute abnormality in the abdomen or pelvis Hepatic steatosis Cholelithiasis without evidence of cholecystitis Aortic atherosclerosis  CT ANGIO CHEST PE W AND/OR WO CONTRAST performed on 11/13/2022 No evidence of pulmonary embolism. 2 mm subpleural nodule in the right middle lobe, almost certainly benign. No follow-up needed if patient is low-risk.This recommendation follows the consensus statement: Guidelines for Management of Incidental Pulmonary Nodules Detected on CT Images: From the Fleischner Society 2017; Radiology 2017; 284:228-243. Aortic atherosclerosis   TRANSTHORACIC ECHOCARDIOGRAM performed on 09/18/2021 Left ventricular ejection fraction, by estimation, is 55 to 60%. The left ventricle has normal function. The left ventricle has no regional wall motion abnormalities. Left ventricular  diastolic parameters were normal.  Right ventricular systolic function is normal. The right ventricular size is normal.  The mitral valve is normal in structure. Trivial mitral valve regurgitation.  The aortic valve is tricuspid. Aortic valve regurgitation is mild.  Aortic dilatation noted. There is mild dilatation of the ascending aorta, measuring 39 mm.  The inferior vena cava is normal in size with greater than 50% respiratory variability, suggesting right atrial pressure of 3 mmHg.   Impression and Plan:  Jaime [name] has been referred for pre-anesthesia review and clearance prior to him undergoing the planned anesthetic and procedural courses. Available labs, pertinent testing, and imaging results were personally reviewed by me in preparation for upcoming operative/procedural course. [facility] medical record has been updated following extensive record review and patient interview with PAT staff.   This patient has been appropriately cleared by cardiology with an overall ACCEPTABLE risk of experiencing significant perioperative cardiovascular complications. Based on clinical review performed today (07/22/23), barring any significant acute changes in the patient's overall condition, it is anticipated that he will be able to proceed with the planned surgical intervention. Any acute changes in clinical condition may necessitate his procedure being postponed and/or cancelled. Patient will meet with anesthesia team (MD and/or CRNA) on the day of his procedure for preoperative evaluation/assessment. Questions regarding anesthetic course will be fielded at that time.   Pre-surgical instructions were reviewed with the patient during his PAT appointment, and questions were fielded to satisfaction by PAT clinical staff. He has been instructed on which medications that he will need to hold prior to surgery, as well as the ones that have been deemed safe/appropriate to take on the day of his  procedure. As  part of the general education provided by PAT, patient made aware both verbally and in writing, that he would need to abstain from the use of any illegal substances during his perioperative course.  He was advised that failure to follow the provided instructions could necessitate case cancellation or result in serious perioperative complications up to and including death. Patient encouraged to contact PAT and/or his surgeon's office to discuss any questions or concerns that may arise prior to surgery; verbalized understanding.   Quentin Mulling, MSN, APRN, FNP-C, CEN [facility]  Perioperative Services Nurse Practitioner Phone: [phone] Fax: [phone] 07/22/23 11:11 AM  NOTE: This note has been prepared using Dragon dictation software. Despite my best ability to proofread, there is always the potential that unintentional transcriptional errors may still occur from this process.

## 2023-07-22 ENCOUNTER — Encounter: Payer: Self-pay | Admitting: Surgery

## 2023-07-23 ENCOUNTER — Encounter: Payer: Self-pay | Admitting: Surgery

## 2023-07-23 ENCOUNTER — Other Ambulatory Visit: Payer: Self-pay

## 2023-07-23 ENCOUNTER — Encounter
Admission: RE | Disposition: A | Discharge status: Home / Self Care | Payer: Self-pay | Source: Home / Self Care | Attending: Surgery

## 2023-07-23 ENCOUNTER — Ambulatory Visit
Admission: RE | Admit: 2023-07-23 | Discharge: 2023-07-23 | Disposition: A | Discharge status: Home / Self Care | Payer: BC Managed Care – PPO | Attending: Surgery | Admitting: Surgery

## 2023-07-23 ENCOUNTER — Ambulatory Visit: Payer: Self-pay | Admitting: Urgent Care

## 2023-07-23 ENCOUNTER — Ambulatory Visit: Payer: BC Managed Care – PPO | Admitting: Urgent Care

## 2023-07-23 DIAGNOSIS — Z79899 Other long term (current) drug therapy: Secondary | ICD-10-CM | POA: Diagnosis not present

## 2023-07-23 DIAGNOSIS — Z955 Presence of coronary angioplasty implant and graft: Secondary | ICD-10-CM | POA: Diagnosis not present

## 2023-07-23 DIAGNOSIS — K420 Umbilical hernia with obstruction, without gangrene: Secondary | ICD-10-CM | POA: Insufficient documentation

## 2023-07-23 DIAGNOSIS — I10 Essential (primary) hypertension: Secondary | ICD-10-CM | POA: Insufficient documentation

## 2023-07-23 DIAGNOSIS — Z86718 Personal history of other venous thrombosis and embolism: Secondary | ICD-10-CM | POA: Insufficient documentation

## 2023-07-23 DIAGNOSIS — I252 Old myocardial infarction: Secondary | ICD-10-CM | POA: Diagnosis not present

## 2023-07-23 DIAGNOSIS — K4090 Unilateral inguinal hernia, without obstruction or gangrene, not specified as recurrent: Secondary | ICD-10-CM | POA: Insufficient documentation

## 2023-07-23 DIAGNOSIS — I2510 Atherosclerotic heart disease of native coronary artery without angina pectoris: Secondary | ICD-10-CM | POA: Insufficient documentation

## 2023-07-23 DIAGNOSIS — E785 Hyperlipidemia, unspecified: Secondary | ICD-10-CM | POA: Diagnosis not present

## 2023-07-23 DIAGNOSIS — Z8719 Personal history of other diseases of the digestive system: Secondary | ICD-10-CM | POA: Diagnosis present

## 2023-07-23 DIAGNOSIS — K219 Gastro-esophageal reflux disease without esophagitis: Secondary | ICD-10-CM | POA: Insufficient documentation

## 2023-07-23 DIAGNOSIS — N400 Enlarged prostate without lower urinary tract symptoms: Secondary | ICD-10-CM | POA: Insufficient documentation

## 2023-07-23 DIAGNOSIS — Z7901 Long term (current) use of anticoagulants: Secondary | ICD-10-CM | POA: Diagnosis not present

## 2023-07-23 HISTORY — DX: Personal history of other medical treatment: Z92.89

## 2023-07-23 HISTORY — PX: INSERTION OF MESH: SHX5868

## 2023-07-23 HISTORY — PX: UMBILICAL HERNIA REPAIR: SHX196

## 2023-07-23 HISTORY — DX: Fatty (change of) liver, not elsewhere classified: K76.0

## 2023-07-23 HISTORY — DX: Unilateral inguinal hernia, without obstruction or gangrene, not specified as recurrent: K40.90

## 2023-07-23 HISTORY — DX: Thoracic aortic ectasia: I77.810

## 2023-07-23 HISTORY — DX: Chronic embolism and thrombosis of unspecified deep veins of unspecified lower extremity: I82.509

## 2023-07-23 HISTORY — DX: Long term (current) use of anticoagulants: Z79.01

## 2023-07-23 HISTORY — DX: Atherosclerosis of aorta: I70.0

## 2023-07-23 HISTORY — DX: Umbilical hernia with obstruction, without gangrene: K42.0

## 2023-07-23 HISTORY — DX: Carpal tunnel syndrome, left upper limb: G56.02

## 2023-07-23 SURGERY — HERNIORRHAPHY, INGUINAL, ROBOT-ASSISTED, LAPAROSCOPIC
Anesthesia: General | Site: Inguinal | Laterality: Right

## 2023-07-23 MED ORDER — ACETAMINOPHEN 500 MG PO TABS
1000.0000 mg | ORAL_TABLET | ORAL | Status: AC
  Administered 2023-07-23: 1000 mg via ORAL

## 2023-07-23 MED ORDER — DEXAMETHASONE SODIUM PHOSPHATE 10 MG/ML IJ SOLN
INTRAMUSCULAR | Status: DC | PRN
  Administered 2023-07-23: 10 mg via INTRAVENOUS

## 2023-07-23 MED ORDER — CEFAZOLIN SODIUM-DEXTROSE 2-4 GM/100ML-% IV SOLN
2.0000 g | INTRAVENOUS | Status: AC
  Administered 2023-07-23: 2 g via INTRAVENOUS

## 2023-07-23 MED ORDER — MIDAZOLAM HCL 2 MG/2ML IJ SOLN
INTRAMUSCULAR | Status: AC
  Filled 2023-07-23: qty 2

## 2023-07-23 MED ORDER — FENTANYL CITRATE (PF) 100 MCG/2ML IJ SOLN
INTRAMUSCULAR | Status: DC | PRN
  Administered 2023-07-23: 50 ug via INTRAVENOUS

## 2023-07-23 MED ORDER — PHENYLEPHRINE 80 MCG/ML (10ML) SYRINGE FOR IV PUSH (FOR BLOOD PRESSURE SUPPORT)
PREFILLED_SYRINGE | INTRAVENOUS | Status: DC | PRN
  Administered 2023-07-23 (×3): 80 ug via INTRAVENOUS

## 2023-07-23 MED ORDER — PHENYLEPHRINE 80 MCG/ML (10ML) SYRINGE FOR IV PUSH (FOR BLOOD PRESSURE SUPPORT)
PREFILLED_SYRINGE | INTRAVENOUS | Status: AC
  Filled 2023-07-23: qty 10

## 2023-07-23 MED ORDER — PROPOFOL 10 MG/ML IV BOLUS
INTRAVENOUS | Status: AC
  Filled 2023-07-23: qty 20

## 2023-07-23 MED ORDER — GABAPENTIN 300 MG PO CAPS
ORAL_CAPSULE | ORAL | Status: AC
  Filled 2023-07-23: qty 1

## 2023-07-23 MED ORDER — PROPOFOL 10 MG/ML IV BOLUS
INTRAVENOUS | Status: DC | PRN
  Administered 2023-07-23: 150 mg via INTRAVENOUS

## 2023-07-23 MED ORDER — ONDANSETRON HCL 4 MG/2ML IJ SOLN
INTRAMUSCULAR | Status: DC | PRN
  Administered 2023-07-23: 4 mg via INTRAVENOUS

## 2023-07-23 MED ORDER — ROCURONIUM BROMIDE 100 MG/10ML IV SOLN
INTRAVENOUS | Status: DC | PRN
  Administered 2023-07-23: 50 mg via INTRAVENOUS
  Administered 2023-07-23: 30 mg via INTRAVENOUS

## 2023-07-23 MED ORDER — CHLORHEXIDINE GLUCONATE CLOTH 2 % EX PADS
6.0000 | MEDICATED_PAD | Freq: Once | CUTANEOUS | Status: AC
  Administered 2023-07-23: 6 via TOPICAL

## 2023-07-23 MED ORDER — DEXAMETHASONE SODIUM PHOSPHATE 10 MG/ML IJ SOLN
INTRAMUSCULAR | Status: AC
  Filled 2023-07-23: qty 1

## 2023-07-23 MED ORDER — BUPIVACAINE LIPOSOME 1.3 % IJ SUSP: 20.0000 mL | Freq: Once | INTRAMUSCULAR | Status: DC

## 2023-07-23 MED ORDER — CEFAZOLIN SODIUM-DEXTROSE 2-4 GM/100ML-% IV SOLN
INTRAVENOUS | Status: AC
  Filled 2023-07-23: qty 100

## 2023-07-23 MED ORDER — BUPIVACAINE-EPINEPHRINE (PF) 0.5% -1:200000 IJ SOLN
INTRAMUSCULAR | Status: AC
  Filled 2023-07-23: qty 30

## 2023-07-23 MED ORDER — EPHEDRINE 5 MG/ML INJ
INTRAVENOUS | Status: AC
  Filled 2023-07-23: qty 5

## 2023-07-23 MED ORDER — SUGAMMADEX SODIUM 200 MG/2ML IV SOLN
INTRAVENOUS | Status: DC | PRN
  Administered 2023-07-23: 200 mg via INTRAVENOUS

## 2023-07-23 MED ORDER — OXYCODONE HCL 5 MG PO TABS
5.0000 mg | ORAL_TABLET | Freq: Once | ORAL | Status: AC | PRN
  Administered 2023-07-23: 5 mg via ORAL

## 2023-07-23 MED ORDER — CHLORHEXIDINE GLUCONATE 0.12 % MT SOLN
OROMUCOSAL | Status: AC
  Filled 2023-07-23: qty 15

## 2023-07-23 MED ORDER — LACTATED RINGERS IV SOLN
INTRAVENOUS | Status: DC
Start: 2023-07-23 — End: 2023-07-23

## 2023-07-23 MED ORDER — FENTANYL CITRATE (PF) 100 MCG/2ML IJ SOLN
INTRAMUSCULAR | Status: AC
  Filled 2023-07-23: qty 2

## 2023-07-23 MED ORDER — ROCURONIUM BROMIDE 10 MG/ML (PF) SYRINGE
PREFILLED_SYRINGE | INTRAVENOUS | Status: AC
  Filled 2023-07-23: qty 10

## 2023-07-23 MED ORDER — EPHEDRINE SULFATE-NACL 50-0.9 MG/10ML-% IV SOSY
PREFILLED_SYRINGE | INTRAVENOUS | Status: DC | PRN
  Administered 2023-07-23: 5 mg via INTRAVENOUS
  Administered 2023-07-23: 10 mg via INTRAVENOUS
  Administered 2023-07-23: 5 mg via INTRAVENOUS
  Administered 2023-07-23: 7.5 mg via INTRAVENOUS
  Administered 2023-07-23: 5 mg via INTRAVENOUS

## 2023-07-23 MED ORDER — OXYCODONE-ACETAMINOPHEN 5-325 MG PO TABS: 1.0000 | ORAL_TABLET | ORAL | 0 refills | Status: DC | PRN

## 2023-07-23 MED ORDER — OXYCODONE HCL 5 MG/5ML PO SOLN: 5.0000 mg | Freq: Once | ORAL | Status: AC | PRN

## 2023-07-23 MED ORDER — BUPIVACAINE-EPINEPHRINE (PF) 0.5% -1:200000 IJ SOLN
INTRAMUSCULAR | Status: DC | PRN
  Administered 2023-07-23: 30 mL via PERINEURAL

## 2023-07-23 MED ORDER — ONDANSETRON HCL 4 MG/2ML IJ SOLN
INTRAMUSCULAR | Status: AC
  Filled 2023-07-23: qty 2

## 2023-07-23 MED ORDER — BUPIVACAINE LIPOSOME 1.3 % IJ SUSP
INTRAMUSCULAR | Status: DC | PRN
  Administered 2023-07-23: 20 mL

## 2023-07-23 MED ORDER — FENTANYL CITRATE (PF) 100 MCG/2ML IJ SOLN: 25.0000 ug | INTRAMUSCULAR | Status: DC | PRN

## 2023-07-23 MED ORDER — BUPIVACAINE LIPOSOME 1.3 % IJ SUSP
INTRAMUSCULAR | Status: AC
  Filled 2023-07-23: qty 20

## 2023-07-23 MED ORDER — LIDOCAINE HCL (CARDIAC) PF 100 MG/5ML IV SOSY
PREFILLED_SYRINGE | INTRAVENOUS | Status: DC | PRN
  Administered 2023-07-23: 80 mg via INTRAVENOUS

## 2023-07-23 MED ORDER — ACETAMINOPHEN 500 MG PO TABS
ORAL_TABLET | ORAL | Status: AC
  Filled 2023-07-23: qty 2

## 2023-07-23 MED ORDER — ACETAMINOPHEN 325 MG PO TABS: 650.0000 mg | ORAL_TABLET | Freq: Four times a day (QID) | ORAL | Status: AC | PRN

## 2023-07-23 MED ORDER — CHLORHEXIDINE GLUCONATE 0.12 % MT SOLN
15.0000 mL | Freq: Once | OROMUCOSAL | Status: AC
  Administered 2023-07-23: 15 mL via OROMUCOSAL

## 2023-07-23 MED ORDER — KETOROLAC TROMETHAMINE 30 MG/ML IJ SOLN
INTRAMUSCULAR | Status: DC | PRN
  Administered 2023-07-23: 30 mg via INTRAVENOUS

## 2023-07-23 MED ORDER — GABAPENTIN 300 MG PO CAPS
300.0000 mg | ORAL_CAPSULE | ORAL | Status: AC
  Administered 2023-07-23: 300 mg via ORAL

## 2023-07-23 MED ORDER — MIDAZOLAM HCL 2 MG/2ML IJ SOLN
INTRAMUSCULAR | Status: DC | PRN
  Administered 2023-07-23: 2 mg via INTRAVENOUS

## 2023-07-23 MED ORDER — OXYCODONE HCL 5 MG PO TABS
ORAL_TABLET | ORAL | Status: AC
  Filled 2023-07-23: qty 1

## 2023-07-23 MED ORDER — LIDOCAINE HCL (PF) 2 % IJ SOLN
INTRAMUSCULAR | Status: AC
  Filled 2023-07-23: qty 5

## 2023-07-23 MED ORDER — ORAL CARE MOUTH RINSE: 15.0000 mL | Freq: Once | OROMUCOSAL | Status: AC

## 2023-07-23 SURGICAL SUPPLY — 64 items
BLADE SURG 15 STRL LF DISP TIS (BLADE) ×3 IMPLANT
BLADE SURG 15 STRL SS (BLADE) ×3
CHLORAPREP W/TINT 26 (MISCELLANEOUS) ×3 IMPLANT
COVER TIP SHEARS 8 DVNC (MISCELLANEOUS) ×3 IMPLANT
COVER WAND RF STERILE (DRAPES) ×3 IMPLANT
DERMABOND ADVANCED .7 DNX12 (GAUZE/BANDAGES/DRESSINGS) ×3 IMPLANT
DRAPE ARM DVNC X/XI (DISPOSABLE) ×9 IMPLANT
DRAPE COLUMN DVNC XI (DISPOSABLE) ×3 IMPLANT
DRAPE LAPAROTOMY 77X122 PED (DRAPES) ×3 IMPLANT
ELECT CAUTERY BLADE TIP 2.5 (TIP) ×3
ELECT REM PT RETURN 9FT ADLT (ELECTROSURGICAL) ×3
ELECTRODE CAUTERY BLDE TIP 2.5 (TIP) ×3 IMPLANT
ELECTRODE REM PT RTRN 9FT ADLT (ELECTROSURGICAL) ×3 IMPLANT
FORCEPS BPLR R/ABLATION 8 DVNC (INSTRUMENTS) ×3 IMPLANT
GAUZE 4X4 16PLY ~~LOC~~+RFID DBL (SPONGE) ×3 IMPLANT
GLOVE SURG SYN 7.0 (GLOVE) ×9
GLOVE SURG SYN 7.0 PF PI (GLOVE) ×6 IMPLANT
GLOVE SURG SYN 7.5 E (GLOVE) ×9
GLOVE SURG SYN 7.5 PF PI (GLOVE) ×6 IMPLANT
GOWN STRL REUS W/ TWL LRG LVL3 (GOWN DISPOSABLE) ×12 IMPLANT
GOWN STRL REUS W/TWL LRG LVL3 (GOWN DISPOSABLE) ×12
IRRIGATION STRYKERFLOW (MISCELLANEOUS) ×3 IMPLANT
IRRIGATOR STRYKERFLOW (MISCELLANEOUS)
IV NS 1000ML (IV SOLUTION)
IV NS 1000ML BAXH (IV SOLUTION) IMPLANT
KIT PINK PAD W/HEAD ARE REST (MISCELLANEOUS) ×3
KIT PINK PAD W/HEAD ARM REST (MISCELLANEOUS) ×3 IMPLANT
LABEL OR SOLS (LABEL) ×3 IMPLANT
MANIFOLD NEPTUNE II (INSTRUMENTS) ×3 IMPLANT
MESH 3DMAX MID 4X6 RT LRG (Mesh General) IMPLANT
MESH VENTRALEX ST 2.5 CRC MED (Mesh General) IMPLANT
NDL DRIVE SUT CUT DVNC (INSTRUMENTS) ×3 IMPLANT
NDL HYPO 22X1.5 SAFETY MO (MISCELLANEOUS) ×3 IMPLANT
NDL INSUFFLATION 14GA 120MM (NEEDLE) ×3 IMPLANT
NEEDLE DRIVE SUT CUT DVNC (INSTRUMENTS) ×3
NEEDLE HYPO 22X1.5 SAFETY MO (MISCELLANEOUS) ×3
NEEDLE INSUFFLATION 14GA 120MM (NEEDLE) ×3
NS IRRIG 500ML POUR BTL (IV SOLUTION) ×3 IMPLANT
OBTURATOR OPTICAL STND 8 DVNC (TROCAR) ×3
OBTURATOR OPTICALSTD 8 DVNC (TROCAR) ×3 IMPLANT
PACK BASIN MINOR ARMC (MISCELLANEOUS) ×3 IMPLANT
PACK LAP CHOLECYSTECTOMY (MISCELLANEOUS) ×3 IMPLANT
PENCIL SMOKE EVACUATOR (MISCELLANEOUS) ×3 IMPLANT
SCISSORS MNPLR CVD DVNC XI (INSTRUMENTS) ×3 IMPLANT
SEAL UNIV 5-12 XI (MISCELLANEOUS) ×9 IMPLANT
SET TUBE SMOKE EVAC HIGH FLOW (TUBING) ×3 IMPLANT
SOL ELECTROSURG ANTI STICK (MISCELLANEOUS) ×3
SOLUTION ELECTROSURG ANTI STCK (MISCELLANEOUS) ×3 IMPLANT
SPONGE T-LAP 18X18 ~~LOC~~+RFID (SPONGE) ×3 IMPLANT
SUT ETHIBOND 0 MO6 C/R (SUTURE) ×3 IMPLANT
SUT MNCRL AB 4-0 PS2 18 (SUTURE) ×3 IMPLANT
SUT STRATA 2-0 23CM CT-2 (SUTURE) ×3 IMPLANT
SUT VIC AB 2-0 SH 27 (SUTURE) ×6
SUT VIC AB 2-0 SH 27XBRD (SUTURE) ×6 IMPLANT
SUT VIC AB 3-0 SH 27 (SUTURE) ×3
SUT VIC AB 3-0 SH 27X BRD (SUTURE) ×3 IMPLANT
SUT VICRYL 0 UR6 27IN ABS (SUTURE) ×6 IMPLANT
SYR 20ML LL LF (SYRINGE) ×3 IMPLANT
SYS BAG RETRIEVAL 10MM (BASKET) ×3
SYSTEM BAG RETRIEVAL 10MM (BASKET) IMPLANT
TAPE TRANSPORE STRL 2 31045 (GAUZE/BANDAGES/DRESSINGS) ×3 IMPLANT
TRAP FLUID SMOKE EVACUATOR (MISCELLANEOUS) ×3 IMPLANT
TRAY FOLEY SLVR 16FR LF STAT (SET/KITS/TRAYS/PACK) ×3 IMPLANT
WATER STERILE IRR 500ML POUR (IV SOLUTION) ×3 IMPLANT

## 2023-07-23 NOTE — Anesthesia Preprocedure Evaluation (Signed)
Anesthesia Evaluation  Patient identified by MRN, date of birth, ID band Patient awake    Reviewed: Allergy & Precautions, NPO status , Patient's Chart, lab work & pertinent test results  History of Anesthesia Complications Negative for: history of anesthetic complications  Airway Mallampati: III  TM Distance: <3 FB Neck ROM: full    Dental  (+) Chipped   Pulmonary neg pulmonary ROS, neg shortness of breath   Pulmonary exam normal        Cardiovascular Exercise Tolerance: Good hypertension, (-) angina + CAD, + Past MI and + Cardiac Stents  (-) DOE Normal cardiovascular exam     Neuro/Psych  Neuromuscular disease  negative psych ROS   GI/Hepatic Neg liver ROS,GERD  Controlled,,  Endo/Other  negative endocrine ROS    Renal/GU Renal disease     Musculoskeletal   Abdominal   Peds  Hematology negative hematology ROS (+)   Anesthesia Other Findings Past Medical History: No date: Allergy No date: Aortic atherosclerosis (HCC) No date: Ascending aorta dilatation (HCC)     Comment:  a.) TTE 09/18/2021: asc Ao 39 mm No date: BPH (benign prostatic hyperplasia) No date: Bradycardia 2012: Coronary artery disease     Comment:  a.) MI (unknown type) 2012 --> PCI placing stent               (unknown type) to mLAD; b.) LHC 10/01/2013: 40-50% pRCA -              med mgmt; c.) LHC 10/22/2015: 30% pLAD, 10% ISR mLAD,               30-40% pRCA - med mgmt No date: ED (erectile dysfunction)     Comment:  a.) on PDE5i (tadalafil) No date: GERD (gastroesophageal reflux disease) No date: Hepatic steatosis 04/27/2020: History of 2019 novel coronavirus disease (COVID-19) No date: History of echocardiogram     Comment:  a.) TTE 09/18/2021: EF 55-60%, no RWMAs, normal RVSF,               triv MR, mild AR, asc Ao 39 mm No date: Hyperlipidemia No date: Hypertension No date: Incarcerated umbilical hernia No date: Kidney stones No  date: Left carpal tunnel syndrome     Comment:  a.) s/p release No date: 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)     Comment:  a.) last was in 2021; provoked in setting of               surgery/hospitalization and concurrent SARS-CoV-2; b.)               evaluated by hematology 06/2021; hypercoagulable work up               NEGATIVE (antiphospholipid syndrome panel (-), normal               protein S antigen, normal protein C activity, normal               ATIII, Factor V Leiden mutation (-), prothrombin gene               mutation (-) 2012: Myocardial infarction (HCC)     Comment:  a.) details unclear; Tx'd in [place], [place] --> PCI of               mLAD (unknown type stent) No date: On apixaban therapy     Comment:  a.) hematology advised no need for lifelong  anticoagulation (06/2021); patient notes that he was told              in [facility] that lifelong OAC would be required; (+) DVT               formation off of OAC in the past per his report; patient               and PCP have elected to continue therapy No date: Right inguinal hernia No date: Seborrheic dermatitis  Past Surgical History: 2012: CORONARY ANGIOPLASTY WITH STENT PLACEMENT 01/30/2022: EXTRACORPOREAL SHOCK WAVE LITHOTRIPSY; Left     Comment:  Procedure: EXTRACORPOREAL SHOCK WAVE LITHOTRIPSY (ESWL);              Surgeon: Riki Altes, MD;  Location: [facility] ORS;                Service: Urology;  Laterality: Left; No date: REPLACEMENT TOTAL KNEE BILATERAL; Bilateral     Comment:  2017 left knee, 2019 right knee No date: WRIST SURGERY; Left     Comment:  Carpel Tunnel  BMI    Body Mass Index: 31.51 kg/m      Reproductive/Obstetrics negative OB ROS                             Anesthesia Physical Anesthesia Plan  ASA: 3  Anesthesia Plan: General ETT   Post-op Pain Management:    Induction: Intravenous  PONV Risk Score and Plan: Ondansetron, Dexamethasone,  Midazolam and Treatment may vary due to age or medical condition  Airway Management Planned: Oral ETT  Additional Equipment:   Intra-op Plan:   Post-operative Plan: Extubation in OR  Informed Consent: I have reviewed the patients History and Physical, chart, labs and discussed the procedure including the risks, benefits and alternatives for the proposed anesthesia with the patient or authorized representative who has indicated his/her understanding and acceptance.     Dental Advisory Given  Plan Discussed with: Anesthesiologist, CRNA and Surgeon  Anesthesia Plan Comments: (Patient consented for risks of anesthesia including but not limited to:  - adverse reactions to medications - damage to eyes, teeth, lips or other oral mucosa - nerve damage due to positioning  - sore throat or hoarseness - Damage to heart, brain, nerves, lungs, other parts of body or loss of life  Patient voiced understanding and assent.)       Anesthesia Quick Evaluation

## 2023-07-23 NOTE — Anesthesia Procedure Notes (Signed)
Procedure Name: Intubation Date/Time: 07/23/2023 7:40 AM  Performed by: Nameer Summer, [place], CRNAPre-anesthesia Checklist: Patient identified, Patient being monitored, Timeout performed, Emergency Drugs available and Suction available Patient Re-evaluated:Patient Re-evaluated prior to induction Oxygen Delivery Method: Circle system utilized Preoxygenation: Pre-oxygenation with 100% oxygen Induction Type: IV induction Ventilation: Mask ventilation without difficulty Laryngoscope Size: McGrath and 4 Grade View: Grade I Tube type: Oral Tube size: 7.5 mm Number of attempts: 1 Airway Equipment and Method: Stylet Placement Confirmation: ETT inserted through vocal cords under direct vision, positive ETCO2 and breath sounds checked- equal and bilateral Secured at: 23 cm Tube secured with: Tape Dental Injury: Teeth and Oropharynx as per pre-operative assessment

## 2023-07-23 NOTE — Op Note (Signed)
Procedure Date:  07/23/2023  Pre-operative Diagnosis:  Right inguinal hernia, incarcerated umbilical hernia  Post-operative Diagnosis: Right inguinal hernia, incarcerated umbilical hernia, 2 cm.  Procedure: 1.  Robotic assisted right Inguinal Hernia Repair 2.  Creation of right Posterior Rectus-Transversalis Fascia Advancment Flap for Coverage of Pelvic Wound (200 cm) 3.  Open umbilical hernia repair with mesh.  Surgeon:  Howie Ill, MD  Anesthesia:  General endotracheal  Estimated Blood Loss:  15 ml  Specimens:  None  Complications:  None  Indications for Procedure:  This is a 62 y.o. male who presents with an umbilical hernia and a right inguinal hernia.  The options of surgery versus observation were reviewed with the patient and/or family. The risks of bleeding, abscess or infection, recurrence of symptoms, potential for an open procedure, injury to surrounding structures, and chronic pain were all discussed with the patient and he was willing to proceed.  We have planned this transabdominal procedure with the creation of a right peritoneal flap based on the posterior rectus sheath and transversalis fascia in order to fully cover the mesh, creating a natural tisssue barrier for the bowel and peritoneal cavity.  Description of Procedure: The patient was correctly identified in the preoperative area and brought into the operating room.  The patient was placed supine with VTE prophylaxis in place.  Appropriate time-outs were performed.  Anesthesia was induced and the patient was intubated.  Foley catheter was placed.  Appropriate antibiotics were infused.  The abdomen was prepped and draped in a sterile fashion. A supraumbilical incision was made. Cautery was used to dissect along the stalk and to detach it from the underlying fascia, revealing a 2 cm defect.  Cautery was used to clean the fascial edges and resect the hernia sac, and a Hasson trocar was inserted.   Pneumoperitoneum was obtained with appropriate opening pressures.  A Veress needle was used to start dissecting the peritoneal flap.  Two 8-mm robotic ports were placed in the right and left lateral positions under direct visualization.  A large righ Bard 3D Max Mid Mesh, a 2-0 Vicryl, and 2-0 vlock suture were placed through the umbilical port under direct visualization.  The [employer] platform was docked onto the patient, the camera was inserted and targeted, and the instruments were placed under direct visualization.  Both inguinal regions were inspected for hernias and it was confirmed that the patient had a right inguinal hernia.  Using electocautery, the peritoneal and posterior rectus tissue flap was created.  The peritoneum on the right side was scored from the median umbilical ligament laterally towards the ASIS.  The flap was mobilized using robotic scissors and the bipolar instruments, creating a plane along the posterior rectus sheath and transversalis fascia down to the pubic tubercle medially. It was then further mobilized laterally across the inguinal canal and femoral vessels and onto the psoas muscle. The inferior epigastric vessels were identified and preserved. This created a posterior rectus and peritoneal flap measuring roughly 17 cm x 12 cm.  The hernia sac and contents were reduced preserving all structures.  The patient had a large cord lipoma which was resected.  A large right Bard 3D Max Mid mesh was placed with good overlap along all the potential hernia defects and secured in place with 2-0 Vicryl along the medial superomedial and superolateral aspects.  Then, the peritoneal flap was advanced over the mesh and carried over to close the defect. A running 2-0 V lock suture was used to approximate  the edge of the flap onto the peritoneum.  All needles were removed under direct visualization.  The cord lipoma was placed in an endocatch bag.  The 8- mm ports were removed under direct  visualization and the Hasson trocar was removed as well as the endocatch bag.  A medium size ventrio hernia patch was inserted, and tails secured using 2-0 Prolene.  The hernia defect was closed using 0 Ethibond sutures, incorporating the mesh with the sutures.  Local anesthetic was infused in all incisions as well as a right ilioinguinal block.  The umbilical stalk was reattached using 2-0 Vicryl and the umbilical incision was closed with 3-0 Vicryl and 4-0 Monocryl.  The other port incisions were closed with 4-0 Monocryl.  The wounds were cleaned and sealed with DermaBond.  Foley catheter was removed and the patient was emerged from anesthesia and extubated and brought to the recovery room for further management.  The patient tolerated the procedure well and all counts were correct at the end of the case.   Howie Ill, MD

## 2023-07-23 NOTE — Interval H&P Note (Signed)
History and Physical Interval Note:  07/23/2023 7:08 AM  Jaime [name]  has presented today for surgery, with the diagnosis of right inguinal hernia, incarcerated umbilical hernia less 3 cm.  The various methods of treatment have been discussed with the patient and family. After consideration of risks, benefits and other options for treatment, the patient has consented to  Procedure(s): XI ROBOTIC ASSISTED INGUINAL HERNIA (Right) HERNIA REPAIR UMBILICAL ADULT, open (N/A) as a surgical intervention.  The patient's history has been reviewed, patient examined, no change in status, stable for surgery.  I have reviewed the patient's chart and labs.  Questions were answered to the patient's satisfaction.     [name]

## 2023-07-23 NOTE — Transfer of Care (Signed)
Immediate Anesthesia Transfer of Care Note  Patient: Jaime [name]  Procedure(s) Performed: XI ROBOTIC ASSISTED INGUINAL HERNIA (Right) HERNIA REPAIR UMBILICAL ADULT, open INSERTION OF MESH (Right: Inguinal)  Patient Location: PACU  Anesthesia Type:General  Level of Consciousness: awake, alert , and oriented  Airway & Oxygen Therapy: Patient Spontanous Breathing and Patient connected to face mask oxygen  Post-op Assessment: Report given to RN and Post -op Vital signs reviewed and stable  Post vital signs: Reviewed and stable  Last Vitals:  Vitals Value Taken Time  BP 117/72 07/23/23 0955  Temp 36.4 C 07/23/23 0955  Pulse 97 07/23/23 1000  Resp 14 07/23/23 1000  SpO2 100 % 07/23/23 1000  Vitals shown include unfiled device data.  Last Pain:  Vitals:   07/23/23 0955  TempSrc:   PainSc: 0-No pain         Complications: No notable events documented.

## 2023-07-23 NOTE — Discharge Instructions (Signed)
Discharge Instructions: 1.  Patient may shower, but do not scrub wounds heavily and dab dry only. 2.  Do not submerge wounds in pool/tub until fully healed. 3.  Do not apply ointments or hydrogen peroxide to the wounds. 4.  May apply ice packs to the wounds for comfort. 5.  It is normal for there to be bruising or swelling at the groin/scrotum after this surgery. This will improve on its own. 6.  May resume your Eliquis on 07/25/23. 7.  No heavy lifting or pushing of more than 10-15 lbs for 6 weeks. 8.  Do not drive while taking narcotics for pain control.  Prior to driving, make sure you are able to rotate right and left to look at blindspots without significant pain or discomfort.

## 2023-07-23 NOTE — Anesthesia Postprocedure Evaluation (Signed)
Anesthesia Post Note  Patient: Jaime [name]  Procedure(s) Performed: XI ROBOTIC ASSISTED INGUINAL HERNIA (Right) HERNIA REPAIR UMBILICAL ADULT, open INSERTION OF MESH (Right: Inguinal)  Patient location during evaluation: PACU Anesthesia Type: General Level of consciousness: awake and alert Pain management: pain level controlled Vital Signs Assessment: post-procedure vital signs reviewed and stable Respiratory status: spontaneous breathing, nonlabored ventilation, respiratory function stable and patient connected to nasal cannula oxygen Cardiovascular status: blood pressure returned to baseline and stable Postop Assessment: no apparent nausea or vomiting Anesthetic complications: no   No notable events documented.   Last Vitals:  Vitals:   07/23/23 1041 07/23/23 1118  BP: 138/65 127/75  Pulse: 93 92  Resp: 18 16  Temp: 36.7 C   SpO2: 98% 95%    Last Pain:  Vitals:   07/23/23 1118  TempSrc:   PainSc: 2                  Cleda Mccreedy Kerby Hockley

## 2023-07-24 ENCOUNTER — Encounter: Payer: Self-pay | Admitting: Surgery

## 2023-07-24 ENCOUNTER — Ambulatory Visit: Payer: BC Managed Care – PPO | Admitting: Cardiology

## 2023-07-28 ENCOUNTER — Telehealth: Payer: Self-pay | Admitting: *Deleted

## 2023-07-28 NOTE — Telephone Encounter (Signed)
Left message letting patient know FMLA is ready to be picked up.

## 2023-07-29 ENCOUNTER — Telehealth: Payer: Self-pay | Admitting: *Deleted

## 2023-07-29 NOTE — Telephone Encounter (Signed)
Patient called wants to come pick up a note stating that he can return to work tomorrow but also state the restrictions and for how long.   Patient had surgery on 07/23/23 Dr Tereso Newcomer inguinal and umbilical hernia repair

## 2023-08-05 ENCOUNTER — Ambulatory Visit: Payer: BC Managed Care – PPO | Admitting: Surgery

## 2023-08-05 ENCOUNTER — Encounter: Payer: Self-pay | Admitting: Surgery

## 2023-08-05 VITALS — BP 149/86 | HR 64 | Temp 98.5°F | Ht 67.0 in | Wt 194.0 lb

## 2023-08-05 DIAGNOSIS — Z09 Encounter for follow-up examination after completed treatment for conditions other than malignant neoplasm: Secondary | ICD-10-CM

## 2023-08-05 DIAGNOSIS — K4090 Unilateral inguinal hernia, without obstruction or gangrene, not specified as recurrent: Secondary | ICD-10-CM

## 2023-08-05 DIAGNOSIS — K420 Umbilical hernia with obstruction, without gangrene: Secondary | ICD-10-CM

## 2023-08-05 NOTE — Patient Instructions (Signed)

## 2023-08-05 NOTE — Progress Notes (Signed)
08/05/2023  HPI: Jaime [name] is a 62 y.o. male s/p robotic assisted right inguinal hernia repair and open incarcerated umbilical hernia repair with mesh on 07/23/2023.  Patient presents today for follow-up.  He reports that has some initial soreness after surgery but otherwise is doing better now.  He is able to mobilize well.  No troubles with diet or bowel movements.  Vital signs: BP (!) 149/86   Pulse 64   Temp 98.5 F (36.9 C) (Oral)   Ht 5\' 7"  (1.702 m)   Wt 194 lb (88 kg)   SpO2 97%   BMI 30.38 kg/m    Physical Exam: Constitutional: No acute distress Abdomen: Soft, nondistended, with mild soreness to palpation.  Incisions are healing well and are clean, dry, intact.  There is no evidence of any recurrence at the umbilicus or at the right groin.  Assessment/Plan: This is a 62 y.o. male s/p robotic assisted right inguinal hernia repair and open incarcerated umbilical hernia repair.  - Patient is healing well from his surgery and there is no evidence of any hernia recurrence at the umbilicus or the right groin. - Reminded the patient of activity restrictions for total of 6 weeks.  After that he can resume all activities without any restrictions. - Follow-up as needed.   Howie Ill, MD [facility]

## 2023-08-10 HISTORY — PX: CATARACT EXTRACTION W/ INTRAOCULAR LENS IMPLANT: SHX1309

## 2023-08-20 ENCOUNTER — Other Ambulatory Visit: Payer: Self-pay | Admitting: Nurse Practitioner

## 2023-08-20 NOTE — Telephone Encounter (Signed)
Requested Prescriptions  Pending Prescriptions Disp Refills   ezetimibe (ZETIA) 10 MG tablet [Pharmacy Med Name: EZETIMIBE 10 MG TAB] 90 tablet 1    Sig: TAKE ONE TABLET BY MOUTH ONE TIME DAILY     Cardiovascular:  Antilipid - Sterol Transport Inhibitors Failed - 08/20/2023  1:58 PM      Failed - Lipid Panel in normal range within the last 12 months    Cholesterol, Total  Date Value Ref Range Status  01/05/2023 104 100 - 199 mg/dL Final   LDL Chol Calc (NIH)  Date Value Ref Range Status  01/05/2023 49 0 - 99 mg/dL Final   HDL  Date Value Ref Range Status  01/05/2023 37 (L) >39 mg/dL Final   Triglycerides  Date Value Ref Range Status  01/05/2023 96 0 - 149 mg/dL Final         Passed - AST in normal range and within 360 days    AST  Date Value Ref Range Status  07/03/2023 24 0 - 40 IU/L Final         Passed - ALT in normal range and within 360 days    ALT  Date Value Ref Range Status  07/03/2023 31 0 - 44 IU/L Final         Passed - Patient is not pregnant      Passed - Valid encounter within last 12 months    Recent Outpatient Visits           1 month ago Non-recurrent unilateral inguinal hernia without obstruction or gangrene   [place] [facility] [place], Corrie Dandy T, NP   3 months ago Carpal tunnel syndrome on left   [place] [facility] [place], 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)   [place] [facility] [place], Dorie Rank, NP       Future Appointments             In 1 month Cannady, Dorie Rank, NP [place] [facility],  [facility]             montelukast (SINGULAIR) 10 MG tablet [Pharmacy Med Name: MONTELUKAST 10 MG TAB[*]] 90 tablet 1    Sig: TAKE ONE TABLET BY MOUTH ONE TIME DAILY     Pulmonology:  Leukotriene Inhibitors Passed - 08/20/2023  1:58 PM      Passed - Valid encounter within last 12 months    Recent Outpatient Visits           1 month ago Non-recurrent unilateral inguinal hernia without obstruction or gangrene   [place] [facility] [place], Corrie Dandy T, NP   3 months ago Carpal tunnel syndrome on left   [place] [facility] [place], [place] T, NP   7 months ago Primary hypertension   [place] [facility] [place], Arthur T, NP   9 months ago 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 [place], [place] T, NP   1 year ago Coronary artery disease involving native coronary artery  of native heart with refractory angina pectoris (HCC)   [place] [facility] [place], Corrie Dandy T, NP       Future Appointments             In 1 month [place], [place] T, NP [place] [facility], [facility]             rosuvastatin (CRESTOR) 40 MG tablet [Pharmacy Med Name: ROSUVASTATIN 40 MG TAB[**]] 90 tablet 1    Sig: TAKE ONE TABLET BY MOUTH ONE TIME DAILY     Cardiovascular:  Antilipid - Statins 2 Failed - 08/20/2023  1:58 PM      Failed - Lipid Panel in normal range within the last 12 months    Cholesterol, Total  Date Value Ref Range Status  01/05/2023 104 100 - 199 mg/dL Final   LDL Chol Calc (NIH)  Date Value Ref Range Status  01/05/2023 49 0 - 99 mg/dL Final   HDL  Date Value Ref Range Status  01/05/2023 37 (L) >39 mg/dL Final   Triglycerides  Date Value Ref Range Status  01/05/2023 96 0 - 149 mg/dL Final         Passed - Cr in normal range and within 360 days    Creatinine, Ser  Date Value Ref Range Status   07/03/2023 1.13 0.76 - 1.27 mg/dL Final         Passed - Patient is not pregnant      Passed - Valid encounter within last 12 months    Recent Outpatient Visits           1 month ago Non-recurrent unilateral inguinal hernia without obstruction or gangrene   [place] [facility] [place], Corrie Dandy T, NP   3 months ago Carpal tunnel syndrome on left   [place] [facility] [place], Corrie Dandy T, NP   7 months ago Primary hypertension   [place] [facility] [place], [place] T, NP   9 months ago 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

## 2023-08-24 HISTORY — PX: CATARACT EXTRACTION W/ INTRAOCULAR LENS IMPLANT: SHX1309

## 2023-09-09 DIAGNOSIS — G5602 Carpal tunnel syndrome, left upper limb: Secondary | ICD-10-CM

## 2023-09-09 HISTORY — DX: Carpal tunnel syndrome, left upper limb: G56.02

## 2023-09-24 ENCOUNTER — Encounter
Admission: RE | Admit: 2023-09-24 | Discharge: 2023-09-24 | Disposition: A | Discharge status: Home / Self Care | Payer: BC Managed Care – PPO | Source: Ambulatory Visit | Attending: Specialist | Admitting: Specialist

## 2023-09-24 ENCOUNTER — Other Ambulatory Visit: Payer: Self-pay

## 2023-09-24 VITALS — Ht 67.0 in | Wt 200.0 lb

## 2023-09-24 DIAGNOSIS — I10 Essential (primary) hypertension: Secondary | ICD-10-CM

## 2023-09-24 DIAGNOSIS — Z01812 Encounter for preprocedural laboratory examination: Secondary | ICD-10-CM

## 2023-09-24 HISTORY — DX: Pure hypercholesterolemia, unspecified: E78.00

## 2023-09-24 HISTORY — DX: Essential (primary) hypertension: I10

## 2023-09-24 HISTORY — DX: Thoracic aortic aneurysm, without rupture, unspecified: I71.20

## 2023-09-24 NOTE — Patient Instructions (Addendum)
Your procedure is scheduled on: Thursday, January 23 Report to the Registration Desk on the 1st floor of the [facility] To find out your arrival time, please call [phone] between 1PM - 3PM on: Wednesday, January 22 If your arrival time is 6:00 am, do not arrive before that time as the [facility] entrance doors do not open until 6:00 am.  REMEMBER: Instructions that are not followed completely may result in serious medical risk, up to and including death; or upon the discretion of your surgeon and anesthesiologist your surgery may need to be rescheduled.  Do not eat food after midnight the night before surgery.  No gum chewing or hard candies.  You may however, drink CLEAR liquids up to 2 hours before you are scheduled to arrive for your surgery. Do not drink anything within 2 hours of your scheduled arrival time.  Clear liquids include: - water  - apple juice without pulp - gatorade (not RED colors) - black coffee or tea (Do NOT add milk or creamers to the coffee or tea) Do NOT drink anything that is not on this list.  One week prior to surgery: starting January 16 Stop Anti-inflammatories (NSAIDS) such as Advil, Aleve, Ibuprofen, Motrin, Naproxen, Naprosyn and Aspirin based products such as Excedrin, Goody's Powder, BC Powder. Stop ANY OVER THE COUNTER supplements until after surgery.  You may however, continue to take Tylenol if needed for pain up until the day of surgery.  apixaban (ELIQUIS) - hold 3 days before surgery same as the last surgery. Last day to take is Sunday, January 19. Resume AFTER surgery per surgeon instruction.  tadalafil (CIALIS) - hold for 2 days before surgery. Resume AFTER surgery as needed.  Continue taking all of your other prescription medications up until the day of surgery.  ON THE DAY OF SURGERY ONLY TAKE THESE MEDICATIONS WITH SIPS OF WATER:  ezetimibe (ZETIA)  gabapentin (NEURONTIN)  hydrALAZINE  pantoprazole (PROTONIX)  ranolazine  (RANEXA) rosuvastatin (CRESTOR)   No Alcohol for 24 hours before or after surgery.  No Smoking including e-cigarettes for 24 hours before surgery.  No chewable tobacco products for at least 6 hours before surgery.  No nicotine patches on the day of surgery.  Do not use any "recreational" drugs for at least a week (preferably 2 weeks) before your surgery.  Please be advised that the combination of cocaine and anesthesia may have negative outcomes, up to and including death. If you test positive for cocaine, your surgery will be cancelled.  On the morning of surgery brush your teeth with toothpaste and water, you may rinse your mouth with mouthwash if you wish. Do not swallow any toothpaste or mouthwash.  Use CHG Soap as directed on instruction sheet.  Do not wear jewelry, make-up, hairpins, clips or nail polish.  For welded (permanent) jewelry: bracelets, anklets, waist bands, etc.  Please have this removed prior to surgery.  If it is not removed, there is a chance that hospital personnel will need to cut it off on the day of surgery.  Do not wear lotions, powders, or perfumes.   Do not shave body hair from the neck down 48 hours before surgery.  Contact lenses, hearing aids and dentures may not be worn into surgery.  Do not bring valuables to the hospital. [facility] is not responsible for any missing/lost belongings or valuables.   Notify your doctor if there is any change in your medical condition (cold, fever, infection).  Wear comfortable clothing (specific to  your surgery type) to the hospital.  After surgery, you can help prevent lung complications by doing breathing exercises.  Take deep breaths and cough every 1-2 hours.   If you are being discharged the day of surgery, you will not be allowed to drive home. You will need a responsible individual to drive you home and stay with you for 24 hours after surgery.   If you are taking public transportation, you will need to  have a responsible individual with you.  Please call the Pre-admissions Testing Dept. at [phone] if you have any questions about these instructions.  Surgery Visitation Policy:  Patients having surgery or a procedure may have two visitors.  Children under the age of 25 must have an adult with them who is not the patient.  Temporary Visitor Restrictions Due to increasing cases of flu, RSV and COVID-19: Children ages 52 and under will not be able to visit patients in [facility] hospitals under most circumstances.      Preparing for Surgery with CHLORHEXIDINE GLUCONATE (CHG) Soap  Chlorhexidine Gluconate (CHG) Soap  o An antiseptic cleaner that kills germs and bonds with the skin to continue killing germs even after washing  o Used for showering the night before surgery and morning of surgery  Before surgery, you can play an important role by reducing the number of germs on your skin.  CHG (Chlorhexidine gluconate) soap is an antiseptic cleanser which kills germs and bonds with the skin to continue killing germs even after washing.  Please do not use if you have an allergy to CHG or antibacterial soaps. If your skin becomes reddened/irritated stop using the CHG.  1. Shower the NIGHT BEFORE SURGERY and the MORNING OF SURGERY with CHG soap.  2. If you choose to wash your hair, wash your hair first as usual with your normal shampoo.  3. After shampooing, rinse your hair and body thoroughly to remove the shampoo.  4. Use CHG as you would any other liquid soap. You can apply CHG directly to the skin and wash gently with a scrungie or a clean washcloth.  5. Apply the CHG soap to your body only from the neck down. Do not use on open wounds or open sores. Avoid contact with your eyes, ears, mouth, and genitals (private parts). Wash face and genitals (private parts) with your normal soap.  6. Wash thoroughly, paying special attention to the area where your surgery will be  performed.  7. Thoroughly rinse your body with warm water.  8. Do not shower/wash with your normal soap after using and rinsing off the CHG soap.  9. Pat yourself dry with a clean towel.  10. Wear clean pajamas to bed the night before surgery.  12. Place clean sheets on your bed the night of your first shower and do not sleep with pets.  13. Shower again with the CHG soap on the day of surgery prior to arriving at the hospital.  14. Do not apply any deodorants/lotions/powders.  15. Please wear clean clothes to the hospital.

## 2023-09-25 ENCOUNTER — Encounter: Payer: Self-pay | Admitting: Urgent Care

## 2023-09-25 ENCOUNTER — Encounter
Admission: RE | Admit: 2023-09-25 | Discharge: 2023-09-25 | Disposition: A | Discharge status: Home / Self Care | Payer: BC Managed Care – PPO | Source: Ambulatory Visit | Attending: Specialist | Admitting: Specialist

## 2023-09-25 ENCOUNTER — Telehealth: Payer: Self-pay | Admitting: *Deleted

## 2023-09-25 DIAGNOSIS — Z01812 Encounter for preprocedural laboratory examination: Secondary | ICD-10-CM | POA: Insufficient documentation

## 2023-09-25 DIAGNOSIS — I10 Essential (primary) hypertension: Secondary | ICD-10-CM | POA: Diagnosis not present

## 2023-09-25 LAB — BASIC METABOLIC PANEL
Anion gap: 9 (ref 5–15)
BUN: 15 mg/dL (ref 8–23)
CO2: 29 mmol/L (ref 22–32)
Calcium: 9.2 mg/dL (ref 8.9–10.3)
Chloride: 102 mmol/L (ref 98–111)
Creatinine, Ser: 1.04 mg/dL (ref 0.61–1.24)
GFR, Estimated: >60 mL/min (ref >60)
Glucose, Bld: 87 mg/dL (ref 70–99)
Potassium: 3.4 mmol/L — ABNORMAL LOW (ref 3.5–5.1)
Sodium: 140 mmol/L (ref 135–145)

## 2023-09-25 NOTE — Telephone Encounter (Signed)
Pt has been scheduled tele preop add on ok per Bernadene Person, NP due to procedure date and med hold.    Med rec and consent are done.

## 2023-09-25 NOTE — Telephone Encounter (Signed)
Pt has been scheduled tele preop add on ok per Bernadene Person, NP due to procedure date and med hold.   Med rec and consent are done.     Patient Consent for Virtual Visit        Jaime [name] has provided verbal consent on 09/25/2023 for a virtual visit (video or telephone).   CONSENT FOR VIRTUAL VISIT FOR:  Jaime [name]  By participating in this virtual visit I agree to the following:  I hereby voluntarily request, consent and authorize [facility] and its employed or contracted physicians, physician assistants, nurse practitioners or other licensed health care professionals (the Practitioner), to provide me with telemedicine health care services (the "Services") as deemed necessary by the treating Practitioner. I acknowledge and consent to receive the Services by the Practitioner via telemedicine. I understand that the telemedicine visit will involve communicating with the Practitioner through live audiovisual communication technology and the disclosure of certain medical information by electronic transmission. I acknowledge that I have been given the opportunity to request an in-person assessment or other available alternative prior to the telemedicine visit and am voluntarily participating in the telemedicine visit.  I understand that I have the right to withhold or withdraw my consent to the use of telemedicine in the course of my care at any time, without affecting my right to future care or treatment, and that the Practitioner or I may terminate the telemedicine visit at any time. I understand that I have the right to inspect all information obtained and/or recorded in the course of the telemedicine visit and may receive copies of available information for a reasonable fee.  I understand that some of the potential risks of receiving the Services via telemedicine include:  Delay or interruption in medical evaluation due to technological equipment failure or disruption; Information  transmitted may not be sufficient (e.g. poor resolution of images) to allow for appropriate medical decision making by the Practitioner; and/or  In rare instances, security protocols could fail, causing a breach of personal health information.  Furthermore, I acknowledge that it is my responsibility to provide information about my medical history, conditions and care that is complete and accurate to the best of my ability. I acknowledge that Practitioner's advice, recommendations, and/or decision may be based on factors not within their control, such as incomplete or inaccurate data provided by me or distortions of diagnostic images or specimens that may result from electronic transmissions. I understand that the practice of medicine is not an exact science and that Practitioner makes no warranties or guarantees regarding treatment outcomes. I acknowledge that a copy of this consent can be made available to me via my patient portal [facility] MyChart), or I can request a printed copy by calling the office of [facility].    I understand that my insurance will be billed for this visit.   I have read or had this consent read to me. I understand the contents of this consent, which adequately explains the benefits and risks of the Services being provided via telemedicine.  I have been provided ample opportunity to ask questions regarding this consent and the Services and have had my questions answered to my satisfaction. I give my informed consent for the services to be provided through the use of telemedicine in my medical care

## 2023-09-25 NOTE — Telephone Encounter (Signed)
   Name: Jaime [name]  DOB: [birth year]  MRN: [id number]  Primary Cardiologist: Debbe Odea, MD   Preoperative team, please contact this patient and set up a phone call appointment for further preoperative risk assessment. Please obtain consent and complete medication review. Thank you for your help.  I confirm that guidance regarding antiplatelet and oral anticoagulation therapy has been completed and, if necessary, noted below.  Eliquis is not managed by cardiology.  Last refilled by PCP, for DVT. Please reach out to that practice for recommendations concerning cessation perioperatively.  Please reach out to patient concerning when to stop Eliquis   I also confirmed the patient resides in the state of [place]. As per [facility] Medical Board telemedicine laws, the patient must reside in the state in which the provider is licensed.   Napoleon Form, Leodis Rains, NP 09/25/2023, 9:24 AM [facility]

## 2023-09-25 NOTE — Telephone Encounter (Signed)
-----   Message from Verlee Monte sent at 09/25/2023  9:00 AM EST ----- Regarding: Request for pre-operative cardiac clearance Request for pre-operative cardiac clearance:  1. What type of surgery is being performed?  ANTERIOR INTEROSSEOUS NERVE DECOMPRESSION  2. When is this surgery scheduled?  10/01/2023  3. Type of clearance being requested (medical, pharmacy, both)? BOTH   4. Are there any medications that need to be held prior to surgery? APIXABAN x 3 days requested. As of now, will stop on 09/27/2023 per surgeon's direction.  5. Practice name and name of physician performing surgery?  Performing surgeon: Dr. Deeann Saint, MD Requesting clearance: Quentin Mulling, FNP-C    6. Anesthesia type (none, local, MAC, general)? GENERAL  7. What is the office phone and fax number?   Fax: [phone]  ATTENTION: Unable to create telephone message as per your standard workflow. Directed by [facility] providers to send requests for cardiac clearance to this pool for appropriate distribution to provider covering pre-operative clearances.   Quentin Mulling, MSN, APRN, FNP-C, CEN [facility]  Peri-operative Services Nurse Practitioner Phone: [phone] 09/25/23 9:00 AM

## 2023-09-26 ENCOUNTER — Other Ambulatory Visit: Payer: Self-pay | Admitting: Specialist

## 2023-09-26 NOTE — H&P (Signed)
PREOPERATIVE H&P  Chief Complaint: G56.02 Carpal Tunnel Syndrome Left upper limb  HPI: Jaime [name] is a 63 y.o. male who presents for preoperative history and physical with a diagnosis of G56.02 Carpal Tunnel Syndrome Left upper limb. Symptoms are rated as moderate to severe, and have been worsening.  He has failed conservative treatment with splinting, injections, and medication.  This is significantly impairing activities of daily living.  He has elected for surgical management.   Past Medical History:  Diagnosis Date   Allergy    Aneurysm of thoracic aorta (HCC)    Aortic atherosclerosis (HCC)    Ascending aorta dilatation (HCC)    a.) TTE 09/18/2021: asc Ao 39 mm   BPH (benign prostatic hyperplasia)    Bradycardia    Carpal tunnel syndrome of left wrist 09/2023   Coronary artery disease 2012   a.) MI (unknown type) 2012 --> PCI placing stent (unknown type) to mLAD; b.) LHC 10/01/2013: 40-50% pRCA - med mgmt; c.) LHC 10/22/2015: 30% pLAD, 10% ISR mLAD, 30-40% pRCA - med mgmt   ED (erectile dysfunction)    a.) on PDE5i (tadalafil)   Essential hypertension    GERD (gastroesophageal reflux disease)    Hepatic steatosis    History of 2019 novel coronavirus disease (COVID-19) 04/27/2020   History of echocardiogram    a.) TTE 09/18/2021: EF 55-60%, no RWMAs, normal RVSF, triv MR, mild AR, asc Ao 39 mm   Hyperlipidemia    Incarcerated umbilical hernia    Kidney stones    Left carpal tunnel syndrome    a.) s/p release   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)    a.) last was in 2021; provoked in setting of surgery/hospitalization and concurrent SARS-CoV-2; b.) evaluated by hematology 06/2021; hypercoagulable work up NEGATIVE (antiphospholipid syndrome panel (-), normal protein S antigen, normal protein C activity, normal ATIII, Factor V Leiden mutation (-), prothrombin gene mutation (-)   Myocardial infarction (HCC) 2012   a.) details unclear; Tx'd in [place], [place] --> PCI  of mLAD (unknown type stent)   On apixaban therapy    a.) hematology advised no need for lifelong anticoagulation (06/2021); patient notes that he was told  in [facility] that lifelong OAC would be required; (+) DVT formation off of OAC in the past per his report; patient and PCP have elected to continue therapy   Pure hypercholesterolemia    Right inguinal hernia    Seborrheic dermatitis    Past Surgical History:  Procedure Laterality Date   ANTERIOR CRUCIATE LIGAMENT REPAIR Left 1999   CARPAL TUNNEL RELEASE Right 2018   CATARACT EXTRACTION W/ INTRAOCULAR LENS IMPLANT Left 08/24/2023   CATARACT EXTRACTION W/ INTRAOCULAR LENS IMPLANT Right 08/10/2023   COLONOSCOPY     2014, 2018, 2021   CORONARY ANGIOPLASTY WITH STENT PLACEMENT  2012   EXTRACORPOREAL SHOCK WAVE LITHOTRIPSY Left 01/30/2022   Procedure: EXTRACORPOREAL SHOCK WAVE LITHOTRIPSY (ESWL);  Surgeon: Riki Altes, MD;  Location: [facility] ORS;  Service: Urology;  Laterality: Left;   INSERTION OF MESH Right 07/23/2023   Procedure: INSERTION OF MESH;  Surgeon: Henrene Dodge, MD;  Location: [facility] ORS;  Service: General;  Laterality: Right;  umbilical hernia mesh also   TOTAL KNEE ARTHROPLASTY Right 2019   TOTAL KNEE ARTHROPLASTY Left 2017   UMBILICAL HERNIA REPAIR N/A 07/23/2023   Procedure: HERNIA REPAIR UMBILICAL ADULT, open;  Surgeon: Henrene Dodge, MD;  Location: [facility] ORS;  Service: General;  Laterality: N/A;   Social History   Socioeconomic History  Marital status: Married    Spouse name: Coralee Rud   Number of children: 3   Years of education: Not on file   Highest education level: 12th grade  Occupational History   Not on file  Tobacco Use   Smoking status: Never   Smokeless tobacco: Never  Vaping Use   Vaping status: Never Used  Substance and Sexual Activity   Alcohol use: Yes    Comment: RARE Beer- 1 to 2 a month   Drug use: Never   Sexual activity: Yes    Birth control/protection: None  Other Topics Concern   Not  on file  Social History Narrative   Not on file   Social Drivers of Health   Financial Resource Strain: Low Risk  (07/02/2023)   Overall Financial Resource Strain (CARDIA)    Difficulty of Paying Living Expenses: Not very hard  Food Insecurity: No Food Insecurity (07/02/2023)   Hunger Vital Sign    Worried About Running Out of Food in the Last Year: Never true    Ran Out of Food in the Last Year: Never true  Transportation Needs: No Transportation Needs (07/02/2023)   PRAPARE - Administrator, Civil Service (Medical): No    Lack of Transportation (Non-Medical): No  Physical Activity: Sufficiently Active (07/02/2023)   Exercise Vital Sign    Days of Exercise per Week: 3 days    Minutes of Exercise per Session: 90 min  Stress: No Stress Concern Present (07/02/2023)   [employer] of Occupational Health - Occupational Stress Questionnaire    Feeling of Stress : Only a little  Social Connections: Moderately Integrated (07/02/2023)   Social Connection and Isolation Panel [NHANES]    Frequency of Communication with Friends and Family: More than three times a week    Frequency of Social Gatherings with Friends and Family: More than three times a week    Attends Religious Services: More than 4 times per year    Active Member of [employer] or Organizations: No    Attends Engineer, structural: Not on file    Marital Status: Married   Family History  Problem Relation Age of Onset   Pancreatic cancer Mother    Cancer Father    Heart attack Paternal Aunt    Allergies  Allergen Reactions   Codeine Other (See Comments)    "Black outs"   Prior to Admission medications   Medication Sig Start Date End Date Taking? Authorizing Provider  acetaminophen (TYLENOL) 325 MG tablet Take 2 tablets (650 mg total) by mouth every 6 (six) hours as needed for mild pain (pain score 1-3). 07/23/23   Henrene Dodge, MD  amLODipine (NORVASC) 10 MG tablet Take 1 tablet (10 mg total) by  mouth daily. Patient taking differently: Take 10 mg by mouth at bedtime. 02/06/22   Debbe Odea, MD  apixaban (ELIQUIS) 5 MG TABS tablet Take 1 tablet (5 mg total) by mouth 2 (two) times daily. 11/19/22   Cannady, Corrie Dandy T, NP  Desoximetasone 0.25 % LIQD Apply to scalp daily for 2 weeks and then only use as needed for itchy scalp (no more then twice weekly). 07/04/22   Cannady, Corrie Dandy T, NP  ezetimibe (ZETIA) 10 MG tablet TAKE ONE TABLET BY MOUTH ONE TIME DAILY 08/20/23   Cannady, Corrie Dandy T, NP  gabapentin (NEURONTIN) 300 MG capsule Take 300 mg by mouth 3 (three) times daily. 05/29/23   [provider]  hydrALAZINE (APRESOLINE) 50 MG tablet Take 1 tablet (50  mg total) by mouth in the morning and at bedtime. 10/22/22 10/22/23  Debbe Odea, MD  hydrochlorothiazide (HYDRODIURIL) 25 MG tablet Take 1 tablet (25 mg total) by mouth daily. 01/05/23 04/05/24  Aura Dials T, NP  icosapent Ethyl (VASCEPA) 1 g capsule Take 2 capsules (2 g total) by mouth 2 (two) times daily. 01/05/23   Cannady, Corrie Dandy T, NP  ketoconazole (NIZORAL) 2 % shampoo Apply 1 Application topically 2 (two) times a week. 07/07/22   Cannady, Corrie Dandy T, NP  losartan (COZAAR) 100 MG tablet Take 1 tablet (100 mg total) by mouth daily. Patient taking differently: Take 100 mg by mouth every morning. 01/05/23   Cannady, Jolene T, NP  montelukast (SINGULAIR) 10 MG tablet TAKE ONE TABLET BY MOUTH ONE TIME DAILY Patient taking differently: Take 10 mg by mouth at bedtime. 08/20/23   Cannady, Corrie Dandy T, NP  nitroGLYCERIN (NITROSTAT) 0.4 MG SL tablet Place 1 tablet (0.4 mg total) under the tongue every 5 (five) minutes as needed for chest pain. For a maximum of 3 doses in 24 hours. 09/24/21   Debbe Odea, MD  pantoprazole (PROTONIX) 40 MG tablet Take 1 tablet (40 mg total) by mouth in the morning and at bedtime. Need appt for further refills 01/05/23   Aura Dials T, NP  ranolazine (RANEXA) 1000 MG SR tablet Take 1 tablet (1,000  mg total) by mouth 2 (two) times daily. 01/05/23   Cannady, Corrie Dandy T, NP  rosuvastatin (CRESTOR) 40 MG tablet TAKE ONE TABLET BY MOUTH ONE TIME DAILY 08/20/23   Cannady, Corrie Dandy T, NP  tadalafil (CIALIS) 20 MG tablet Take 1 tablet (20 mg total) by mouth daily as needed for erectile dysfunction. 02/28/22   Michiel Cowboy A, PA-C     Positive ROS: All other systems have been reviewed and were otherwise negative with the exception of those mentioned in the HPI and as above.  Physical Exam: General: Alert, no acute distress Cardiovascular: No pedal edema. Heart is regular and without murmur.  Respiratory: No cyanosis, no use of accessory musculature. Lungs are clear. GI: No organomegaly, abdomen is soft and non-tender Skin: No lesions in the area of chief complaint Neurologic: Sensation intact distally Psychiatric: Patient is competent for consent with normal mood and affect Lymphatic: No axillary or cervical lymphadenopathy  MUSCULOSKELETAL: Left hand shows decreased pinch.  Median nerve compression test mildly positive.  There is some thenar wasting.  Gross sensations intact.  Full fist formation present.  Assessment: G56.02 Carpal Tunnel Syndrome Left upper limb  Plan: Plan for Procedure(s): Carpal Tunnel Release left  The risks benefits and alternatives were discussed with the patient including but not limited to the risks of nonoperative treatment, versus surgical intervention including infection, bleeding, nerve injury,  blood clots, cardiopulmonary complications, morbidity, mortality, among others, and they were willing to proceed.   Valinda Hoar, MD [phone]   09/26/2023 6:52 PM

## 2023-09-28 ENCOUNTER — Ambulatory Visit: Payer: BC Managed Care – PPO | Attending: Cardiology | Admitting: Emergency Medicine

## 2023-09-28 DIAGNOSIS — Z01810 Encounter for preprocedural cardiovascular examination: Secondary | ICD-10-CM

## 2023-09-28 NOTE — Progress Notes (Signed)
Virtual Visit via Telephone Note   Because of Jaime [name]'s co-morbid illnesses, he is at least at moderate risk for complications without adequate follow up.  This format is felt to be most appropriate for this patient at this time.  The patient did not have access to video technology/had technical difficulties with video requiring transitioning to audio format only (telephone).  All issues noted in this document were discussed and addressed.  No physical exam could be performed with this format.  Please refer to the patient's chart for his consent to telehealth for [facility].  Evaluation Performed:  Preoperative cardiovascular risk assessment _____________   Date:  09/28/2023   Patient ID:  Jaime [name], DOB 11/02/1960, MRN [id number] Patient Location:  Home Provider location:   Office  Primary Care Provider:  Marjie Skiff, NP Primary Cardiologist:  Debbe Odea, MD  Chief Complaint / Patient Profile   63 y.o. y/o male with a h/o CAD s/p PCI to LAD in 2012, hypertension, hyperlipidemia, DVT  who is pending ANTERIOR INTEROSSEOUS NERVE DECOMPRESSION on 10/01/2023 with Dr. Hyacinth Meeker and presents today for telephonic preoperative cardiovascular risk assessment.  History of Present Illness    Jaime [name] is a 63 y.o. male who presents via audio/video conferencing for a telehealth visit today.  Pt was last seen in cardiology clinic on 01/20/2023 by Dr. Azucena Cecil.  At that time Aldrick Averill was doing well.  The patient is now pending procedure as outlined above. Since his last visit, he denies chest pain, shortness of breath, lower extremity edema, fatigue, palpitations, melena, hematuria, hemoptysis, diaphoresis, weakness, presyncope, syncope, orthopnea, and PND.  Past Medical History    Past Medical History:  Diagnosis Date   Allergy    Aneurysm of thoracic aorta (HCC)    Aortic atherosclerosis (HCC)    Ascending aorta dilatation (HCC)    a.) TTE  09/18/2021: asc Ao 39 mm   BPH (benign prostatic hyperplasia)    Bradycardia    Carpal tunnel syndrome of left wrist 09/2023   Coronary artery disease 2012   a.) MI (unknown type) 2012 --> PCI placing stent (unknown type) to mLAD; b.) LHC 10/01/2013: 40-50% pRCA - med mgmt; c.) LHC 10/22/2015: 30% pLAD, 10% ISR mLAD, 30-40% pRCA - med mgmt   ED (erectile dysfunction)    a.) on PDE5i (tadalafil)   Essential hypertension    GERD (gastroesophageal reflux disease)    Hepatic steatosis    History of 2019 novel coronavirus disease (COVID-19) 04/27/2020   History of echocardiogram    a.) TTE 09/18/2021: EF 55-60%, no RWMAs, normal RVSF, triv MR, mild AR, asc Ao 39 mm   Hyperlipidemia    Incarcerated umbilical hernia    Kidney stones    Left carpal tunnel syndrome    a.) s/p release   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)    a.) last was in 2021; provoked in setting of surgery/hospitalization and concurrent SARS-CoV-2; b.) evaluated by hematology 06/2021; hypercoagulable work up NEGATIVE (antiphospholipid syndrome panel (-), normal protein S antigen, normal protein C activity, normal ATIII, Factor V Leiden mutation (-), prothrombin gene mutation (-)   Myocardial infarction [facility]) 2012   a.) details unclear; Tx'd in [place], [place] --> PCI of mLAD (unknown type stent)   On apixaban therapy    a.) hematology advised no need for lifelong anticoagulation (06/2021); patient notes that he was told  in [facility] that lifelong OAC would be required; (+) DVT formation off of OAC in the past  per his report; patient and PCP have elected to continue therapy   Pure hypercholesterolemia    Right inguinal hernia    Seborrheic dermatitis    Past Surgical History:  Procedure Laterality Date   ANTERIOR CRUCIATE LIGAMENT REPAIR Left 1999   CARPAL TUNNEL RELEASE Right 2018   CATARACT EXTRACTION W/ INTRAOCULAR LENS IMPLANT Left 08/24/2023   CATARACT EXTRACTION W/ INTRAOCULAR LENS IMPLANT Right 08/10/2023    COLONOSCOPY     2014, 2018, 2021   CORONARY ANGIOPLASTY WITH STENT PLACEMENT  2012   EXTRACORPOREAL SHOCK WAVE LITHOTRIPSY Left 01/30/2022   Procedure: EXTRACORPOREAL SHOCK WAVE LITHOTRIPSY (ESWL);  Surgeon: Riki Altes, MD;  Location: [facility] ORS;  Service: Urology;  Laterality: Left;   INSERTION OF MESH Right 07/23/2023   Procedure: INSERTION OF MESH;  Surgeon: Henrene Dodge, MD;  Location: [facility] ORS;  Service: General;  Laterality: Right;  umbilical hernia mesh also   TOTAL KNEE ARTHROPLASTY Right 2019   TOTAL KNEE ARTHROPLASTY Left 2017   UMBILICAL HERNIA REPAIR N/A 07/23/2023   Procedure: HERNIA REPAIR UMBILICAL ADULT, open;  Surgeon: Henrene Dodge, MD;  Location: [facility] ORS;  Service: General;  Laterality: N/A;    Allergies  Allergies  Allergen Reactions   Codeine Other (See Comments)    "Black outs"    Home Medications    Prior to Admission medications   Medication Sig Start Date End Date Taking? Authorizing Provider  acetaminophen (TYLENOL) 325 MG tablet Take 2 tablets (650 mg total) by mouth every 6 (six) hours as needed for mild pain (pain score 1-3). 07/23/23   Henrene Dodge, MD  amLODipine (NORVASC) 10 MG tablet Take 1 tablet (10 mg total) by mouth daily. Patient taking differently: Take 10 mg by mouth at bedtime. 02/06/22   Debbe Odea, MD  apixaban (ELIQUIS) 5 MG TABS tablet Take 1 tablet (5 mg total) by mouth 2 (two) times daily. 11/19/22   Cannady, Corrie Dandy T, NP  Desoximetasone 0.25 % LIQD Apply to scalp daily for 2 weeks and then only use as needed for itchy scalp (no more then twice weekly). 07/04/22   Cannady, Corrie Dandy T, NP  ezetimibe (ZETIA) 10 MG tablet TAKE ONE TABLET BY MOUTH ONE TIME DAILY 08/20/23   Cannady, Corrie Dandy T, NP  gabapentin (NEURONTIN) 300 MG capsule Take 300 mg by mouth 3 (three) times daily. 05/29/23   [provider]  hydrALAZINE (APRESOLINE) 50 MG tablet Take 1 tablet (50 mg total) by mouth in the morning and at bedtime. 10/22/22 10/22/23   Debbe Odea, MD  hydrochlorothiazide (HYDRODIURIL) 25 MG tablet Take 1 tablet (25 mg total) by mouth daily. 01/05/23 04/05/24  Aura Dials T, NP  icosapent Ethyl (VASCEPA) 1 g capsule Take 2 capsules (2 g total) by mouth 2 (two) times daily. 01/05/23   Cannady, Corrie Dandy T, NP  ketoconazole (NIZORAL) 2 % shampoo Apply 1 Application topically 2 (two) times a week. 07/07/22   Cannady, Corrie Dandy T, NP  losartan (COZAAR) 100 MG tablet Take 1 tablet (100 mg total) by mouth daily. Patient taking differently: Take 100 mg by mouth every morning. 01/05/23   Cannady, Jolene T, NP  montelukast (SINGULAIR) 10 MG tablet TAKE ONE TABLET BY MOUTH ONE TIME DAILY Patient taking differently: Take 10 mg by mouth at bedtime. 08/20/23   Cannady, Corrie Dandy T, NP  nitroGLYCERIN (NITROSTAT) 0.4 MG SL tablet Place 1 tablet (0.4 mg total) under the tongue every 5 (five) minutes as needed for chest pain. For a maximum of 3 doses in 24  hours. 09/24/21   Debbe Odea, MD  pantoprazole (PROTONIX) 40 MG tablet Take 1 tablet (40 mg total) by mouth in the morning and at bedtime. Need appt for further refills 01/05/23   Aura Dials T, NP  ranolazine (RANEXA) 1000 MG SR tablet Take 1 tablet (1,000 mg total) by mouth 2 (two) times daily. 01/05/23   Cannady, Corrie Dandy T, NP  rosuvastatin (CRESTOR) 40 MG tablet TAKE ONE TABLET BY MOUTH ONE TIME DAILY 08/20/23   Cannady, Corrie Dandy T, NP  tadalafil (CIALIS) 20 MG tablet Take 1 tablet (20 mg total) by mouth daily as needed for erectile dysfunction. 02/28/22   Harle Battiest, PA-C    Physical Exam    Vital Signs:  Jermone Cabiness does not have vital signs available for review today.  Given telephonic nature of communication, physical exam is limited. AAOx3. NAD. Normal affect.  Speech and respirations are unlabored.  Accessory Clinical Findings    None  Assessment & Plan    1.  Preoperative Cardiovascular Risk Assessment: According to the Revised Cardiac Risk Index (RCRI), his  Perioperative Risk of Major Cardiac Event is (%): 0.4. His Functional Capacity in METs is: 7.99 according to the Duke Activity Status Index (DASI). Therefore, based on ACC/AHA guidelines, patient would be at acceptable risk for the planned procedure without further cardiovascular testing.  The patient was advised that if he develops new symptoms prior to surgery to contact our office to arrange for a follow-up visit, and he verbalized understanding.  Eliquis is not managed by cardiology.  Last refilled by PCP, for DVT. Please reach out to that practice for recommendations concerning cessation perioperatively.  Please reach out to patient concerning when to stop Eliquis.   A copy of this note will be routed to requesting surgeon.  Time:   Today, I have spent 7 minutes with the patient with telehealth technology discussing medical history, symptoms, and management plan.     Denyce Robert, NP  09/28/2023, 12:11 PM

## 2023-09-30 NOTE — Progress Notes (Signed)
Perioperative / Anesthesia Services  Pre-Admission Testing Clinical Review / Pre-Operative Anesthesia Consult  Date: 09/30/23  Patient Demographics:  Name: Jaime [name] DOB: [birth year] MRN:   [id number]  Planned Surgical Procedure(s):    Case: [id number] Date/Time: 10/01/23 0900   Procedure: Carpal Tunnel Release (Left)   Anesthesia type: General   Pre-op diagnosis: G56.02 Carpal Tunnel Syndrome Left upper limb   Location: [facility] OR ROOM 09 / [facility]   Surgeons: Deeann Saint, MD      NOTE: Available PAT nursing documentation and vital signs have been reviewed. Clinical nursing staff has updated patient's PMH/PSHx, current medication list, and drug allergies/intolerances to ensure comprehensive history available to assist in medical decision making as it pertains to the aforementioned surgical procedure and anticipated anesthetic course. Extensive review of available clinical information personally performed. [place] PMH and PSHx updated with any diagnoses/procedures that  may have been inadvertently omitted during his intake with the pre-admission testing department's nursing staff.  Clinical Discussion:  Jaime [name] is a 63 y.o. male who is submitted for pre-surgical anesthesia review and clearance prior to him undergoing the above procedure. Patient has never been a smoker in the past. CAD, MI, bradycardia, ascending aortic dilatation, multiple DVTs, aortic atherosclerosis, HTN, HLD, GERD (on daily PPI), RIGHT inguinal hernia, incarcerated umbilical hernia, BPH, nephrolithiasis, ED (on PDE5i).   Patient is followed by cardiology Azucena Cecil, MD). He was last seen in the cardiology clinic on 01/20/2023; notes reviewed. At the time of his clinic visit, patient doing well overall from a cardiovascular perspective. Patient denied any chest pain, shortness of breath, PND, orthopnea, palpitations, significant peripheral edema, weakness, fatigue, vertiginous  symptoms, or presyncope/syncope. Patient with a past medical history significant for cardiovascular diagnoses. Documented physical exam was grossly benign, providing no evidence of acute exacerbation and/or decompensation of the patient's known cardiovascular conditions. Of note, complete records regarding patient's cardiovascular history unavailable for review at time of consult, as patient was treated in the state of [place]. Information gathered from patient report and from notes provided by his local cardiologist.   Patient reported to have suffered a MI in 2012 (unknown type).  He subsequently underwent PCI and stenting (unknown type) of the mid LAD.   Patient underwent diagnostic LEFT heart catheterization on 10/01/2013 revealing mild single-vessel CAD.  There was a 40-50% lesion noted to the proximal RCA.  Previously placed stent to the mid LAD noted to be widely patent.  Given the nonobstructive nature of his coronary artery disease, the decision was made to defer further intervention opting for medical management.   Repeat diagnostic LEFT heart catheterization was performed on 10/22/2015 revealing multivessel CAD; 30% proximal LAD and 30-40% proximal RCA.  There was 10% ISR noted to the previously placed mid LAD stent.  Again, further intervention was deferred opting for medical management.   Most recent TTE was performed on 09/18/2021 revealing normal left ventricular systolic function with an EF of 55-60%.  They were no regional wall motion abnormalities.  Right ventricular size and function normal.  There was trivial mitral and mild aortic valve regurgitation.  All transvalvular gradients were noted to be normal providing no evidence suggestive of valvular stenosis.  There was mild dilatation of the ascending aorta measuring up to 39 mm.   Patient with a history of recurrent DVT.  Patient has been seen in consult by local hematologist Cathie Hoops, MD); notes reviewed.  Hypercoagulable workup was  unremarkable; see below.  Hematologist advised that patient did not require  lifelong anticoagulation.  With that said, patient advising that he was told in [place] that he would require oral anticoagulation therapy for the rest of his life.  He reports DVT formation off of anticoagulation therapy.  Current PCP and patient have made the decision to continue lifelong anticoagulation per his report.   Protein S Ag, total = 101% (60-150%) Protein S Ag, free = 102% (61-136%) Protein C activity = 134% (73-180%) Antithrombin III = 95% (75-120%) Factor V mutation - c.1601G>A (p.Arg534Gln) - not detected  Prothrombin gene mutation - c.*97G>A - not detected  Anticardiolipin IgG = < 9 GPL U/mL (0-14 GPL U/mL) Anticardiolipin IgM = < 9 GPL U/mL (0-14 GPL U/mL) PTT lupus anticoagulant = 30.5 seconds (0-51.9 seconds) DRVVT = 30.9 seconds (0-47 seconds)   Again, patient remains on daily oral anticoagulation therapy for recurrent DVT.  He is reportedly compliant with therapy with no evidence or reports of GI/GU related bleeding.  Blood pressure reasonably controlled at 138/80 mmHg on currently prescribed vasodilator (hydralazine), diuretic (hydrochlorothiazide), and ARB (losartan) therapies.  Patient is on ranolazine for prevention of anginal symptoms.  Additionally he has a supply of short acting nitrates (NTG) to use for anginal symptoms not managed by his prescribed scheduled Ranexa; denied recent use.  Patient is on rosuvastatin + icosapent ethyl  + for his HLD diagnosis and ASCVD prevention.  Patient is not diabetic. In the setting of known cardiovascular diagnoses, it is important note that patient is on a PDE5i medication (tadalafil) for and erectile dysfunction diagnosis. He does not have an OSAH diagnosis. Patient is able to complete all of his  ADL/IADLs without cardiovascular limitation.  Per the DASI, patient is able to achieve at least 4 METS of physical activity without experiencing any significant degree  of angina/anginal equivalent symptoms.  No changes were made to his medication regimen.  Patient to follow-up with outpatient cardiology in 6 months or sooner if needed.   Wolf Schill is scheduled for an elective Carpal Tunnel Release (Left) on 09/30/2021 with Dr. Deeann Saint, MD.  Given patient's past medical history significant for cardiovascular diagnoses, presurgical cardiac clearance was sought by the PAT team. Per cardiology, "according to the Revised Cardiac Risk Index (RCRI), his Perioperative Risk of Major Cardiac Event is (%): 0.4. His Functional Capacity in METs is: 7.99 according to the [employer] Activity Status Index (DASI). Therefore, based on ACC/AHA guidelines, patient would be at ACCEPTABLE risk for the planned procedure without further cardiovascular testing".  Again, this patient is on daily oral anticoagulation therapy using a DOAC medication.  He has been instructed on recommendations for holding his apixaban for 3 days prior to his procedure with plans to restart as soon as postoperative bleeding risk felt to be minimized by his attending surgeon. The patient has been instructed that his last dose of apixaban should be on 09/27/2023.Marland Kitchen  Patient denies previous perioperative complications with anesthesia in the past. In review his EMR, it is noted that patient underwent a general anesthetic course here at [facility] ([facility] in 07/2023 without documented complications.      09/24/2023    3:37 PM 08/05/2023    8:53 AM 07/23/2023   11:18 AM  Vitals with BMI  Height 5\' 7"  5\' 7"    Weight 200 lbs 194 lbs   BMI 31.32 30.38   Systolic  149 127  Diastolic  86 75  Pulse  64 92   Providers/Specialists:  NOTE: Primary physician provider listed below. Patient  may have been seen by APP or partner within same practice.   PR[facility]ER [facility]LE [facility]ST Nat Math, MD  Orthopedics (Surgeon) 09/26/2023  Marjie Skiff, NP Primary Care Provider  07/03/2023  Debbe Odea, MD Cardiology 01/20/2023; update call with preop APP on 09/29/2023   Allergies:   Allergies  Allergen Reactions   Codeine Other (See Comments)    "Black outs"   Current Home Medications:   No current facility-administered medications for this encounter.    acetaminophen (TYLENOL) 325 MG tablet   amLODipine (NORVASC) 10 MG tablet   apixaban (ELIQUIS) 5 MG TABS tablet   Desoximetasone 0.25 % LIQD   ezetimibe (ZETIA) 10 MG tablet   gabapentin (NEURONTIN) 300 MG capsule   hydrALAZINE (APRESOLINE) 50 MG tablet   hydrochlorothiazide (HYDRODIURIL) 25 MG tablet   icosapent Ethyl (VASCEPA) 1 g capsule   ketoconazole (NIZORAL) 2 % shampoo   losartan (COZAAR) 100 MG tablet   montelukast (SINGULAIR) 10 MG tablet   nitroGLYCERIN (NITROSTAT) 0.4 MG SL tablet   pantoprazole (PROTONIX) 40 MG tablet   ranolazine (RANEXA) 1000 MG SR tablet   rosuvastatin (CRESTOR) 40 MG tablet   tadalafil (CIALIS) 20 MG tablet   History:   Past Medical History:  Diagnosis Date   Allergy    Aneurysm of thoracic aorta (HCC)    Aortic atherosclerosis (HCC)    Ascending aorta dilatation (HCC)    a.) TTE 09/18/2021: asc Ao 39 mm   BPH (benign prostatic hyperplasia)    Bradycardia    Carpal tunnel syndrome of left wrist 09/2023   Coronary artery disease 2012   a.) MI (unknown type) 2012 --> PCI placing stent (unknown type) to mLAD; b.) LHC 10/01/2013: 40-50% pRCA - med mgmt; c.) LHC 10/22/2015: 30% pLAD, 10% ISR mLAD, 30-40% pRCA - med mgmt   ED (erectile dysfunction)    a.) on PDE5i (tadalafil)   Essential hypertension    GERD (gastroesophageal reflux disease)    Hepatic steatosis    History of 2019 novel coronavirus disease (COVID-19) 04/27/2020   History of echocardiogram    a.) TTE 09/18/2021: EF 55-60%, no RWMAs, normal RVSF, triv MR, mild AR, asc Ao 39 mm   Hyperlipidemia    Incarcerated umbilical hernia    Kidney stones    Left carpal tunnel syndrome    a.) s/p  release   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)    a.) last was in 2021; provoked in setting of surgery/hospitalization and concurrent SARS-CoV-2; b.) evaluated by hematology 06/2021; hypercoagulable work up NEGATIVE (antiphospholipid syndrome panel (-), normal protein S antigen, normal protein C activity, normal ATIII, Factor V Leiden mutation (-), prothrombin gene mutation (-)   Myocardial infarction (HCC) 2012   a.) details unclear; Tx'd in [place], [place] --> PCI of mLAD (unknown type stent)   On apixaban therapy    a.) hematology advised no need for lifelong anticoagulation (06/2021); patient notes that he was told  in [facility] that lifelong OAC would be required; (+) DVT formation off of OAC in the past per his report; patient and PCP have elected to continue therapy   Pure hypercholesterolemia    Right inguinal hernia    Seborrheic dermatitis    Past Surgical History:  Procedure Laterality Date   ANTERIOR CRUCIATE LIGAMENT REPAIR Left 1999   CARPAL TUNNEL RELEASE Right 2018   CATARACT EXTRACTION W/ INTRAOCULAR LENS IMPLANT Left 08/24/2023   CATARACT EXTRACTION W/ INTRAOCULAR LENS IMPLANT Right 08/10/2023   COLONOSCOPY  2014, 2018, 2021   CORONARY ANGIOPLASTY WITH STENT PLACEMENT  2012   EXTRACORPOREAL SHOCK WAVE LITHOTRIPSY Left 01/30/2022   Procedure: EXTRACORPOREAL SHOCK WAVE LITHOTRIPSY (ESWL);  Surgeon: Riki Altes, MD;  Location: [facility] ORS;  Service: Urology;  Laterality: Left;   INSERTION OF MESH Right 07/23/2023   Procedure: INSERTION OF MESH;  Surgeon: Henrene Dodge, MD;  Location: [facility] ORS;  Service: General;  Laterality: Right;  umbilical hernia mesh also   TOTAL KNEE ARTHROPLASTY Right 2019   TOTAL KNEE ARTHROPLASTY Left 2017   UMBILICAL HERNIA REPAIR N/A 07/23/2023   Procedure: HERNIA REPAIR UMBILICAL ADULT, open;  Surgeon: Henrene Dodge, MD;  Location: [facility] ORS;  Service: General;  Laterality: N/A;   Family History  Problem Relation Age of Onset    Pancreatic cancer Mother    Cancer Father    Heart attack Paternal Aunt    Social History   Tobacco Use   Smoking status: Never   Smokeless tobacco: Never  Substance Use Topics   Alcohol use: Yes    Comment: [employer] Beer- 1 to 2 a month   Pertinent Clinical Results:  LABS:  Lab Results  Component Value Date   WBC 7.7 07/03/2023   HGB 16.0 07/03/2023   HCT 47.7 07/03/2023   MCV 93 07/03/2023   PLT 223 07/03/2023   Lab Results  Component Value Date   NA 140 09/25/2023   K 3.4 (L) 09/25/2023   CO2 29 09/25/2023   GLUCOSE 87 09/25/2023   BUN 15 09/25/2023   CREATININE 1.04 09/25/2023   CALCIUM 9.2 09/25/2023   EGFR 74 07/03/2023   GFRNONAA >60 09/25/2023    ECG: Date: 07/03/2023 Rate: 79 bpm Rhythm: normal sinus Axis (leads I and aVF): Normal Intervals: PR 162 ms. QRS 102 ms. QTc 441 ms. ST segment and T wave changes: Nonspecific T wave abnormalities; diffuse artifact Comparison: Similar to previous tracing obtained on 01/20/2023  IMAGING / PROCEDURES: CT ABDOMEN PELVIS W CONTRAST performed on 06/03/2023 No acute abnormality in the abdomen or pelvis Hepatic steatosis Cholelithiasis without evidence of cholecystitis Aortic atherosclerosis   CT ANGIO CHEST PE W AND/OR WO CONTRAST performed on 11/13/2022 No evidence of pulmonary embolism. 2 mm subpleural nodule in the right middle lobe, almost certainly benign. No follow-up needed if patient is low-risk.This recommendation follows the consensus statement: Guidelines for Management of Incidental Pulmonary Nodules Detected on CT Images: From the Fleischner Society 2017; Radiology 2017; 284:228-243. Aortic atherosclerosis    TRANSTHORACIC ECHOCARDIOGRAM performed on 09/18/2021 Left ventricular ejection fraction, by estimation, is 55 to 60%. The left ventricle has normal function. The left ventricle has no regional wall motion abnormalities. Left ventricular diastolic parameters were normal.  Right ventricular systolic  function is normal. The right ventricular size is normal.  The mitral valve is normal in structure. Trivial mitral valve regurgitation.  The aortic valve is tricuspid. Aortic valve regurgitation is mild.  Aortic dilatation noted. There is mild dilatation of the ascending aorta, measuring 39 mm.  The inferior vena cava is normal in size with greater than 50% respiratory variability, suggesting right atrial pressure of 3 mmHg.   MYOCARDIAL PERFUSION IMAGING STUDY (LEXISCAN) performed on 12/28/2018 Abnormal myocardial SPECT perfusion study.  Suspect small area reversible ischemia inferior, inferolateral, apex (RCA)  Soft tissue attenuation decreases the diagnostic accuracy  The LVEF is 46%.  LV is dilated. Global and segmental hypokinesis, particularly ventricular septum and inferoseptum  The previous MPI from January 2017 reported diaphragm attenuation and EF 64%  Impression and Plan:  Harol Masoner has been referred for pre-anesthesia review and clearance prior to him undergoing the planned anesthetic and procedural courses. Available labs, pertinent testing, and imaging results were personally reviewed by me in preparation for upcoming operative/procedural course. [facility] medical record has been updated following extensive record review and patient interview with PAT staff.   This patient has been appropriately cleared by cardiology with an overall ACCEPTABLE risk of experiencing significant perioperative cardiovascular complications. Based on clinical review performed today (09/30/23), barring any significant acute changes in the patient's overall condition, it is anticipated that he will be able to proceed with the planned surgical intervention. Any acute changes in clinical condition may necessitate his procedure being postponed and/or cancelled. Patient will meet with anesthesia team (MD and/or CRNA) on the day of his procedure for preoperative evaluation/assessment. Questions regarding  anesthetic course will be fielded at that time.   Pre-surgical instructions were reviewed with the patient during his PAT appointment, and questions were fielded to satisfaction by PAT clinical staff. He has been instructed on which medications that he will need to hold prior to surgery, as well as the ones that have been deemed safe/appropriate to take on the day of his procedure. As part of the general education provided by PAT, patient made aware both verbally and in writing, that he would need to abstain from the use of any illegal substances during his perioperative course. He was advised that failure to follow the provided instructions could necessitate case cancellation or result in serious perioperative complications up to and including death. Patient encouraged to contact PAT and/or his surgeon's office to discuss any questions or concerns that may arise prior to surgery; verbalized understanding.   Quentin Mulling, MSN, APRN, FNP-C, CEN [facility]  Perioperative Services Nurse Practitioner Phone: [phone] Fax: [phone] 09/30/23 11:45 AM  NOTE: This note has been prepared using Dragon dictation software. Despite my best ability to proofread, there is always the potential that unintentional transcriptional errors may still occur from this process.

## 2023-10-01 ENCOUNTER — Encounter: Payer: Self-pay | Admitting: Specialist

## 2023-10-01 ENCOUNTER — Ambulatory Visit: Payer: Self-pay | Admitting: Urgent Care

## 2023-10-01 ENCOUNTER — Other Ambulatory Visit: Payer: Self-pay

## 2023-10-01 ENCOUNTER — Ambulatory Visit
Admission: RE | Admit: 2023-10-01 | Discharge: 2023-10-01 | Disposition: A | Discharge status: Home / Self Care | Payer: BC Managed Care – PPO | Source: Ambulatory Visit | Attending: Specialist | Admitting: Specialist

## 2023-10-01 ENCOUNTER — Encounter
Admission: RE | Disposition: A | Discharge status: Home / Self Care | Payer: Self-pay | Source: Ambulatory Visit | Attending: Specialist

## 2023-10-01 DIAGNOSIS — I10 Essential (primary) hypertension: Secondary | ICD-10-CM | POA: Diagnosis not present

## 2023-10-01 DIAGNOSIS — G5602 Carpal tunnel syndrome, left upper limb: Secondary | ICD-10-CM | POA: Diagnosis present

## 2023-10-01 DIAGNOSIS — I2510 Atherosclerotic heart disease of native coronary artery without angina pectoris: Secondary | ICD-10-CM | POA: Insufficient documentation

## 2023-10-01 DIAGNOSIS — K219 Gastro-esophageal reflux disease without esophagitis: Secondary | ICD-10-CM | POA: Diagnosis not present

## 2023-10-01 HISTORY — PX: ANTERIOR INTEROSSEOUS NERVE DECOMPRESSION: SHX5735

## 2023-10-01 SURGERY — ANTERIOR INTEROSSEOUS NERVE DECOMPRESSION
Anesthesia: General | Laterality: Left

## 2023-10-01 MED ORDER — KETOROLAC TROMETHAMINE 30 MG/ML IJ SOLN
INTRAMUSCULAR | Status: DC | PRN
  Administered 2023-10-01: 30 mg via INTRAVENOUS

## 2023-10-01 MED ORDER — PROPOFOL 1000 MG/100ML IV EMUL
INTRAVENOUS | Status: AC
  Filled 2023-10-01: qty 100

## 2023-10-01 MED ORDER — CEFAZOLIN SODIUM-DEXTROSE 2-4 GM/100ML-% IV SOLN
INTRAVENOUS | Status: AC
  Filled 2023-10-01: qty 100

## 2023-10-01 MED ORDER — BUPIVACAINE HCL 0.5 % IJ SOLN
INTRAMUSCULAR | Status: DC | PRN
  Administered 2023-10-01: 117 mL

## 2023-10-01 MED ORDER — GABAPENTIN 300 MG PO CAPS
ORAL_CAPSULE | ORAL | Status: AC
  Filled 2023-10-01: qty 1

## 2023-10-01 MED ORDER — DEXAMETHASONE SODIUM PHOSPHATE 10 MG/ML IJ SOLN
INTRAMUSCULAR | Status: DC | PRN
  Administered 2023-10-01: 10 mg via INTRAVENOUS

## 2023-10-01 MED ORDER — MELOXICAM 7.5 MG PO TABS: 15.0000 mg | ORAL_TABLET | ORAL | Status: DC

## 2023-10-01 MED ORDER — GABAPENTIN 400 MG PO CAPS: 400.0000 mg | ORAL_CAPSULE | Freq: Three times a day (TID) | ORAL | 3 refills | Status: DC

## 2023-10-01 MED ORDER — OXYCODONE HCL 5 MG/5ML PO SOLN
5.0000 mg | Freq: Once | ORAL | Status: DC | PRN
Start: 2023-10-01 — End: 2023-10-01

## 2023-10-01 MED ORDER — 0.9 % SODIUM CHLORIDE (POUR BTL) OPTIME
TOPICAL | Status: DC | PRN
  Administered 2023-10-01: 500 mL

## 2023-10-01 MED ORDER — NAPROXEN 375 MG PO TABS
375.0000 mg | ORAL_TABLET | ORAL | Status: DC
  Filled 2023-10-01: qty 1

## 2023-10-01 MED ORDER — CELECOXIB 200 MG PO CAPS
200.0000 mg | ORAL_CAPSULE | Freq: Once | ORAL | Status: AC
  Administered 2023-10-01: 200 mg via ORAL

## 2023-10-01 MED ORDER — LIDOCAINE HCL (PF) 2 % IJ SOLN
INTRAMUSCULAR | Status: AC
  Filled 2023-10-01: qty 5

## 2023-10-01 MED ORDER — CHLORHEXIDINE GLUCONATE CLOTH 2 % EX PADS: 6.0000 | MEDICATED_PAD | Freq: Once | CUTANEOUS | Status: DC

## 2023-10-01 MED ORDER — ONDANSETRON HCL 4 MG/2ML IJ SOLN
INTRAMUSCULAR | Status: DC | PRN
  Administered 2023-10-01: 4 mg via INTRAVENOUS

## 2023-10-01 MED ORDER — FENTANYL CITRATE (PF) 100 MCG/2ML IJ SOLN
INTRAMUSCULAR | Status: AC
  Filled 2023-10-01: qty 2

## 2023-10-01 MED ORDER — ORAL CARE MOUTH RINSE: 15.0000 mL | Freq: Once | OROMUCOSAL | Status: AC

## 2023-10-01 MED ORDER — LACTATED RINGERS IV SOLN: INTRAVENOUS | Status: DC

## 2023-10-01 MED ORDER — MIDAZOLAM HCL 2 MG/2ML IJ SOLN
INTRAMUSCULAR | Status: AC
  Filled 2023-10-01: qty 2

## 2023-10-01 MED ORDER — KETOROLAC TROMETHAMINE 30 MG/ML IJ SOLN
INTRAMUSCULAR | Status: AC
  Filled 2023-10-01: qty 1

## 2023-10-01 MED ORDER — CEFAZOLIN SODIUM-DEXTROSE 2-4 GM/100ML-% IV SOLN
2.0000 g | INTRAVENOUS | Status: AC
  Administered 2023-10-01: 2 g via INTRAVENOUS

## 2023-10-01 MED ORDER — DEXAMETHASONE SODIUM PHOSPHATE 10 MG/ML IJ SOLN
INTRAMUSCULAR | Status: AC
  Filled 2023-10-01: qty 1

## 2023-10-01 MED ORDER — FENTANYL CITRATE (PF) 100 MCG/2ML IJ SOLN: 25.0000 ug | INTRAMUSCULAR | Status: DC | PRN

## 2023-10-01 MED ORDER — LACTATED RINGERS IV SOLN: INTRAVENOUS | Status: DC | PRN

## 2023-10-01 MED ORDER — CELECOXIB 200 MG PO CAPS
ORAL_CAPSULE | ORAL | Status: AC
  Filled 2023-10-01: qty 1

## 2023-10-01 MED ORDER — CHLORHEXIDINE GLUCONATE 0.12 % MT SOLN
OROMUCOSAL | Status: AC
  Filled 2023-10-01: qty 15

## 2023-10-01 MED ORDER — ONDANSETRON HCL 4 MG/2ML IJ SOLN
INTRAMUSCULAR | Status: AC
  Filled 2023-10-01: qty 2

## 2023-10-01 MED ORDER — MIDAZOLAM HCL 2 MG/2ML IJ SOLN
INTRAMUSCULAR | Status: DC | PRN
  Administered 2023-10-01: 2 mg via INTRAVENOUS

## 2023-10-01 MED ORDER — HYDROCODONE-ACETAMINOPHEN 5-325 MG PO TABS: 1.0000 | ORAL_TABLET | Freq: Four times a day (QID) | ORAL | 0 refills | Status: DC | PRN

## 2023-10-01 MED ORDER — PROPOFOL 10 MG/ML IV BOLUS
INTRAVENOUS | Status: DC | PRN
  Administered 2023-10-01: 150 mg via INTRAVENOUS
  Administered 2023-10-01: 130 ug/kg/min via INTRAVENOUS
  Administered 2023-10-01: 50 mg via INTRAVENOUS

## 2023-10-01 MED ORDER — GABAPENTIN 300 MG PO CAPS
300.0000 mg | ORAL_CAPSULE | ORAL | Status: AC
  Administered 2023-10-01: 300 mg via ORAL

## 2023-10-01 MED ORDER — FENTANYL CITRATE (PF) 100 MCG/2ML IJ SOLN
INTRAMUSCULAR | Status: DC | PRN
  Administered 2023-10-01 (×2): 50 ug via INTRAVENOUS

## 2023-10-01 MED ORDER — CHLORHEXIDINE GLUCONATE 0.12 % MT SOLN
15.0000 mL | Freq: Once | OROMUCOSAL | Status: AC
  Administered 2023-10-01: 15 mL via OROMUCOSAL

## 2023-10-01 MED ORDER — OXYCODONE HCL 5 MG PO TABS: 5.0000 mg | ORAL_TABLET | Freq: Once | ORAL | Status: DC | PRN

## 2023-10-01 MED ORDER — LIDOCAINE HCL (CARDIAC) PF 100 MG/5ML IV SOSY
PREFILLED_SYRINGE | INTRAVENOUS | Status: DC | PRN
  Administered 2023-10-01: 100 mg via INTRAVENOUS

## 2023-10-01 SURGICAL SUPPLY — 27 items
BLADE SURG MINI STRL (BLADE) ×1 IMPLANT
BNDG ESMARCH 4X12 STRL LF (GAUZE/BANDAGES/DRESSINGS) ×1 IMPLANT
CHLORAPREP W/TINT 26 (MISCELLANEOUS) ×1 IMPLANT
CUFF TOURN SGL QUICK 18X4 (TOURNIQUET CUFF) IMPLANT
DRSG GAUZE FLUFF 36X18 (GAUZE/BANDAGES/DRESSINGS) ×2 IMPLANT
ELECT REM PT RETURN 9FT ADLT (ELECTROSURGICAL) ×1
ELECTRODE REM PT RTRN 9FT ADLT (ELECTROSURGICAL) ×1 IMPLANT
GAUZE XEROFORM 1X8 LF (GAUZE/BANDAGES/DRESSINGS) ×1 IMPLANT
GLOVE BIO SURGEON STRL SZ8 (GLOVE) ×1 IMPLANT
GOWN STRL REUS W/ TWL LRG LVL3 (GOWN DISPOSABLE) ×1 IMPLANT
GOWN STRL REUS W/TWL LRG LVL4 (GOWN DISPOSABLE) ×1 IMPLANT
KIT TURNOVER KIT A (KITS) ×1 IMPLANT
MANIFOLD NEPTUNE II (INSTRUMENTS) ×1 IMPLANT
NS IRRIG 500ML POUR BTL (IV SOLUTION) ×1 IMPLANT
PACK EXTREMITY ARMC (MISCELLANEOUS) ×1 IMPLANT
PAD PREP OB/GYN DISP 24X41 (PERSONAL CARE ITEMS) ×1 IMPLANT
PADDING CAST BLEND 4X4 STRL (MISCELLANEOUS) ×1 IMPLANT
SPLINT CAST 1 STEP 3X12 (MISCELLANEOUS) ×1 IMPLANT
STOCKINETTE 48X4 2 PLY STRL (GAUZE/BANDAGES/DRESSINGS) ×1 IMPLANT
STOCKINETTE BIAS CUT 4 980044 (GAUZE/BANDAGES/DRESSINGS) ×1 IMPLANT
STOCKINETTE STRL 4IN 9604848 (GAUZE/BANDAGES/DRESSINGS) ×1 IMPLANT
STOCKINETTE STRL 6IN 960660 (GAUZE/BANDAGES/DRESSINGS) ×1 IMPLANT
SUT ETHILON 4-0 FS2 18XMFL BLK (SUTURE) ×1
SUT ETHILON 5-0 FS-2 18 BLK (SUTURE) ×1 IMPLANT
SUTURE ETHLN 4-0 FS2 18XMF BLK (SUTURE) ×1 IMPLANT
TRAP FLUID SMOKE EVACUATOR (MISCELLANEOUS) ×1 IMPLANT
WATER STERILE IRR 500ML POUR (IV SOLUTION) ×1 IMPLANT

## 2023-10-01 NOTE — Anesthesia Postprocedure Evaluation (Signed)
Anesthesia Post Note  Patient: Jaime [name]  Procedure(s) Performed: Carpal Tunnel Release (Left)  Patient location during evaluation: PACU Anesthesia Type: General Level of consciousness: awake and alert Pain management: pain level controlled Vital Signs Assessment: post-procedure vital signs reviewed and stable Respiratory status: spontaneous breathing, nonlabored ventilation, respiratory function stable and patient connected to nasal cannula oxygen Cardiovascular status: blood pressure returned to baseline and stable Postop Assessment: no apparent nausea or vomiting Anesthetic complications: no   There were no known notable events for this encounter.   Last Vitals:  Vitals:   10/01/23 1031 10/01/23 1048  BP: 122/64 (!) 140/77  Pulse: 62 (!) 51  Resp: 20 20  Temp: (!) 36.1 C (!) 36.3 C  SpO2: 99% 96%    Last Pain:  Vitals:   10/01/23 1048  TempSrc: Temporal  PainSc: 0-No pain                 Cleda Mccreedy Caileigh Canche

## 2023-10-01 NOTE — Anesthesia Procedure Notes (Signed)
Procedure Name: LMA Insertion Date/Time: 10/01/2023 9:13 AM  Performed by: Lysbeth Penner, CRNAPre-anesthesia Checklist: Patient identified, Emergency Drugs available, Suction available, Patient being monitored and Timeout performed Patient Re-evaluated:Patient Re-evaluated prior to induction Oxygen Delivery Method: Circle system utilized Preoxygenation: Pre-oxygenation with 100% oxygen Induction Type: IV induction LMA: LMA inserted LMA Size: 4.0 Number of attempts: 1 Tube secured with: Tape Dental Injury: Teeth and Oropharynx as per pre-operative assessment

## 2023-10-01 NOTE — Transfer of Care (Signed)
Immediate Anesthesia Transfer of Care Note  Patient: Jaime [name]  Procedure(s) Performed: Carpal Tunnel Release (Left)  Patient Location: PACU  Anesthesia Type:General  Level of Consciousness: awake  Airway & Oxygen Therapy: Patient Spontanous Breathing  Post-op Assessment: Report given to RN and Post -op Vital signs reviewed and stable  Post vital signs: Reviewed and stable  Last Vitals:  Vitals Value Taken Time  BP    Temp    Pulse 64 10/01/23 1010  Resp 14 10/01/23 1010  SpO2 100 % 10/01/23 1010  Vitals shown include unfiled device data.  Last Pain:  Vitals:   10/01/23 0747  TempSrc: Temporal  PainSc: 0-No pain         Complications: There were no known notable events for this encounter.

## 2023-10-01 NOTE — Anesthesia Preprocedure Evaluation (Addendum)
Anesthesia Evaluation  Patient identified by MRN, date of birth, ID band Patient awake    Reviewed: Allergy & Precautions, NPO status , Patient's Chart, lab work & pertinent test results  History of Anesthesia Complications Negative for: history of anesthetic complications  Airway Mallampati: III  TM Distance: <3 FB Neck ROM: full    Dental  (+) Chipped   Pulmonary neg pulmonary ROS, neg shortness of breath   Pulmonary exam normal        Cardiovascular Exercise Tolerance: Good hypertension, (-) angina + CAD and + Past MI  (-) DOE Normal cardiovascular exam     Neuro/Psych  Neuromuscular disease  negative psych ROS   GI/Hepatic Neg liver ROS,GERD  Controlled,,  Endo/Other  negative endocrine ROS    Renal/GU Renal disease     Musculoskeletal   Abdominal   Peds  Hematology negative hematology ROS (+)   Anesthesia Other Findings Patient has cardiac clearance for this procedure.   Past Medical History: No date: Allergy No date: Aneurysm of thoracic aorta (HCC) No date: Aortic atherosclerosis (HCC) No date: Ascending aorta dilatation (HCC)     Comment:  a.) TTE 09/18/2021: asc Ao 39 mm No date: BPH (benign prostatic hyperplasia) No date: Bradycardia 09/2023: Carpal tunnel syndrome of left wrist 2012: Coronary artery disease     Comment:  a.) MI (unknown type) 2012 --> PCI placing stent               (unknown type) to mLAD; b.) LHC 10/01/2013: 40-50% pRCA -              med mgmt; c.) LHC 10/22/2015: 30% pLAD, 10% ISR mLAD,               30-40% pRCA - med mgmt No date: ED (erectile dysfunction)     Comment:  a.) on PDE5i (tadalafil) No date: Essential hypertension No date: GERD (gastroesophageal reflux disease) No date: Hepatic steatosis 04/27/2020: History of 2019 novel coronavirus disease (COVID-19) No date: History of echocardiogram     Comment:  a.) TTE 09/18/2021: EF 55-60%, no RWMAs, normal RVSF,                triv MR, mild AR, asc Ao 39 mm No date: Hyperlipidemia No date: Incarcerated umbilical hernia No date: Kidney stones No date: Left carpal tunnel syndrome     Comment:  a.) s/p release No date: 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)     Comment:  a.) last was in 2021; provoked in setting of               surgery/hospitalization and concurrent SARS-CoV-2; b.)               evaluated by hematology 06/2021; hypercoagulable work up               NEGATIVE (antiphospholipid syndrome panel (-), normal               protein S antigen, normal protein C activity, normal               ATIII, Factor V Leiden mutation (-), prothrombin gene               mutation (-) 2012: Myocardial infarction (HCC)     Comment:  a.) details unclear; Tx'd in [place], [place] --> PCI of               mLAD (unknown type stent) No date: On apixaban therapy  Comment:  a.) hematology advised no need for lifelong               anticoagulation (06/2021); patient notes that he was told              in [facility] that lifelong OAC would be required; (+) DVT               formation off of OAC in the past per his report; patient               and PCP have elected to continue therapy No date: Pure hypercholesterolemia No date: Right inguinal hernia No date: Seborrheic dermatitis  Past Surgical History: 1999: ANTERIOR CRUCIATE LIGAMENT REPAIR; Left 2018: CARPAL TUNNEL RELEASE; Right 08/24/2023: CATARACT EXTRACTION W/ INTRAOCULAR LENS IMPLANT; Left 08/10/2023: CATARACT EXTRACTION W/ INTRAOCULAR LENS IMPLANT; Right No date: COLONOSCOPY     Comment:  2014, 2018, 2021 2012: CORONARY ANGIOPLASTY WITH STENT PLACEMENT 01/30/2022: EXTRACORPOREAL SHOCK WAVE LITHOTRIPSY; Left     Comment:  Procedure: EXTRACORPOREAL SHOCK WAVE LITHOTRIPSY (ESWL);              Surgeon: Riki Altes, MD;  Location: [facility];                Service: Urology;  Laterality: Left; 07/23/2023: INSERTION OF MESH; Right     Comment:  Procedure:  INSERTION OF MESH;  Surgeon: Henrene Dodge,               MD;  Location: [facility];  Service: General;  Laterality:               Right;  umbilical hernia mesh also 2019: TOTAL KNEE ARTHROPLASTY; Right 2017: TOTAL KNEE ARTHROPLASTY; Left 07/23/2023: UMBILICAL HERNIA REPAIR; N/A     Comment:  Procedure: HERNIA REPAIR UMBILICAL ADULT, open;                Surgeon: Henrene Dodge, MD;  Location: [facility] OR[facility];                Service: General;  Laterality: N/A;     Reproductive/Obstetrics negative OB ROS                             Anesthesia Physical Anesthesia Plan  ASA: 3  Anesthesia Plan: General LMA   Post-op Pain Management:    Induction: Intravenous  PONV Risk Score and Plan: Dexamethasone, Ondansetron, Midazolam and Treatment may vary due to age or medical condition  Airway Management Planned: LMA  Additional Equipment:   Intra-op Plan:   Post-operative Plan: Extubation in OR  Informed Consent: I have reviewed the patients History and Physical, chart, labs and discussed the procedure including the risks, benefits and alternatives for the proposed anesthesia with the patient or authorized representative who has indicated his/her understanding and acceptance.     Dental Advisory Given  Plan Discussed with: Anesthesiologist, CRNA and Surgeon  Anesthesia Plan Comments: (Patient consented for risks of anesthesia including but not limited to:  - adverse reactions to medications - damage to eyes, teeth, lips or other oral mucosa - nerve damage due to positioning  - sore throat or hoarseness - Damage to heart, brain, nerves, lungs, other parts of body or loss of life  Patient voiced understanding and assent.)       Anesthesia Quick Evaluation

## 2023-10-01 NOTE — Op Note (Signed)
10/01/2023  10:02 AM  PATIENT:  Jaime [name]    PRE-OPERATIVE DIAGNOSIS: LEFT CARPAL TUNNEL SYNDROME  POST-OPERATIVE DIAGNOSIS: LEFT CARPAL TUNNEL SYNDROME  PROCEDURE:  LEFT CARPAL TUNNEL RELEASE  SURGEON: Valinda Hoar, MD  TOURNIQUET TIME: 21  MIN   ANESTHESIA:   General  PREOPERATIVE INDICATIONS:  [name] is a  63 y.o. male with a diagnosis of left carpal tunnel syndrome who failed conservative measures and elected for surgical management.    The risks benefits and alternatives were discussed with the patient preoperatively including but not limited to the risks of infection, bleeding, nerve injury, incomplete relief of symptoms, pillar pain, cardiopulmonary complications, the need for revision surgery, among others, and the patient was willing to proceed.  OPERATIVE FINDINGS: Thickened volar ligament and nerve compression.  OPERATIVE PROCEDURE: The patient is brought to the operating room placed in the supine position. General anesthesia was administered. The left upper extremity was prepped and draped in usual sterile fashion. Time out was performed. The arm was elevated and exsanguinated and the tourniquet was inflated. Incision was made in line with the radial border of the ring finger. The carpal tunnel transverse fascia was identified, cleaned, and incised sharply. The common sensory branches were visualized along with the superficial palmar arch and protected.  The median nerve was protected below. A Kelly clamp was  placed underneath the transverse carpal ligament, protecting the nerve. I released the ligament completely, and then released the proximal distal volar forearm fascia. The nerve was identified, and visualized, and protected throughout the case. The motor branch was intact upon inspection. No masses or abnormalities were identified in the ulnar bursa.  The wounds were irrigated copiously and the skin closed with nylon. The wound was injected with 1/2 % marcaine  followed by a sterile dressing and volar splint. Tourniquet was deflated with good return of blood flow to all fingers. Sponge and needle counts were correct.  The patient tolerated this well, with no complications. The patient was awakened and taken to recovery in good condition.

## 2023-10-01 NOTE — H&P (Signed)
THE PATIENT WAS SEEN PRIOR TO SURGERY TODAY.  HISTORY, ALLERGIES, HOME MEDICATIONS AND OPERATIVE PROCEDURE WERE REVIEWED. RISKS AND BENEFITS OF SURGERY DISCUSSED WITH PATIENT AGAIN.  NO CHANGES FROM INITIAL HISTORY AND PHYSICAL NOTED.    

## 2023-10-02 ENCOUNTER — Encounter: Payer: Self-pay | Admitting: Specialist

## 2023-10-03 DIAGNOSIS — Z9889 Other specified postprocedural states: Secondary | ICD-10-CM | POA: Insufficient documentation

## 2023-10-03 NOTE — Patient Instructions (Signed)
Be Involved in Caring For Your Health:  Taking Medications When medications are taken as directed, they can greatly improve your health. But if they are not taken as prescribed, they may not work. In some cases, not taking them correctly can be harmful. To help ensure your treatment remains effective and safe, understand your medications and how to take them. Bring your medications to each visit for review by your provider.  Your lab results, notes, and after visit summary will be available on My Chart. We strongly encourage you to use this feature. If lab results are abnormal the clinic will contact you with the appropriate steps. If the clinic does not contact you assume the results are satisfactory. You can always view your results on My Chart. If you have questions regarding your health or results, please contact the clinic during office hours. You can also ask questions on My Chart.  We at [facility] are grateful that you chose [place] to provide your care. We strive to provide evidence-based and compassionate care and are always looking for feedback. If you get a survey from the clinic please complete this so we can hear your opinions.  Heart-Healthy Eating Plan Many factors influence your heart health, including eating and exercise habits. Heart health is also called coronary health. Coronary risk increases with abnormal blood fat (lipid) levels. A heart-healthy eating plan includes limiting unhealthy fats, increasing healthy fats, limiting salt (sodium) intake, and making other diet and lifestyle changes. What is my plan? Your health care provider may recommend that: You limit your fat intake to _________% or less of your total calories each day. You limit your saturated fat intake to _________% or less of your total calories each day. You limit the amount of cholesterol in your diet to less than _________ mg per day. You limit the amount of sodium in your diet to less than _________  mg per day. What are tips for following this plan? Cooking Cook foods using methods other than frying. Baking, boiling, grilling, and broiling are all good options. Other ways to reduce fat include: Removing the skin from poultry. Removing all visible fats from meats. Steaming vegetables in water or broth. Meal planning  At meals, imagine dividing your plate into fourths: Fill one-half of your plate with vegetables and green salads. Fill one-fourth of your plate with whole grains. Fill one-fourth of your plate with lean protein foods. Eat 2-4 cups of vegetables per day. One cup of vegetables equals 1 cup (91 g) broccoli or cauliflower florets, 2 medium carrots, 1 large bell pepper, 1 large sweet potato, 1 large tomato, 1 medium white potato, 2 cups (150 g) raw leafy greens. Eat 1-2 cups of fruit per day. One cup of fruit equals 1 small apple, 1 large banana, 1 cup (237 g) mixed fruit, 1 large orange,  cup (82 g) dried fruit, 1 cup (240 mL) 100% fruit juice. Eat more foods that contain soluble fiber. Examples include apples, broccoli, carrots, beans, peas, and barley. Aim to get 25-30 g of fiber per day. Increase your consumption of legumes, nuts, and seeds to 4-5 servings per week. One serving of dried beans or legumes equals  cup (90 g) cooked, 1 serving of nuts is  oz (12 almonds, 24 pistachios, or 7 walnut halves), and 1 serving of seeds equals  oz (8 g). Fats Choose healthy fats more often. Choose monounsaturated and polyunsaturated fats, such as olive and canola oils, avocado oil, flaxseeds, walnuts, almonds, and seeds. Eat  more omega-3 fats. Choose salmon, mackerel, sardines, tuna, flaxseed oil, and ground flaxseeds. Aim to eat fish at least 2 times each week. Check food labels carefully to identify foods with trans fats or high amounts of saturated fat. Limit saturated fats. These are found in animal products, such as meats, butter, and cream. Plant sources of saturated fats  include palm oil, palm kernel oil, and coconut oil. Avoid foods with partially hydrogenated oils in them. These contain trans fats. Examples are stick margarine, some tub margarines, cookies, crackers, and other baked goods. Avoid fried foods. General information Eat more home-cooked food and less restaurant, buffet, and fast food. Limit or avoid alcohol. Limit foods that are high in added sugar and simple starches such as foods made using white refined flour (white breads, pastries, sweets). Lose weight if you are overweight. Losing just 5-10% of your body weight can help your overall health and prevent diseases such as diabetes and heart disease. Monitor your sodium intake, especially if you have high blood pressure. Talk with your health care provider about your sodium intake. Try to incorporate more vegetarian meals weekly. What foods should I eat? Fruits All fresh, canned (in natural juice), or frozen fruits. Vegetables Fresh or frozen vegetables (raw, steamed, roasted, or grilled). Green salads. Grains Most grains. Choose whole wheat and whole grains most of the time. Rice and pasta, including brown rice and pastas made with whole wheat. Meats and other proteins Lean, well-trimmed beef, veal, pork, and lamb. Chicken and [place] without skin. All fish and shellfish. Wild duck, rabbit, pheasant, and venison. Egg whites or low-cholesterol egg substitutes. Dried beans, peas, lentils, and tofu. Seeds and most nuts. Dairy Low-fat or nonfat cheeses, including ricotta and mozzarella. Skim or 1% milk (liquid, powdered, or evaporated). Buttermilk made with low-fat milk. Nonfat or low-fat yogurt. Fats and oils Non-hydrogenated (trans-free) margarines. Vegetable oils, including soybean, sesame, sunflower, olive, avocado, peanut, safflower, corn, canola, and cottonseed. Salad dressings or mayonnaise made with a vegetable oil. Beverages Water (mineral or sparkling). Coffee and tea. Unsweetened ice  tea. Diet beverages. Sweets and desserts Sherbet, gelatin, and fruit ice. Small amounts of dark chocolate. Limit all sweets and desserts. Seasonings and condiments All seasonings and condiments. The items listed above may not be a complete list of foods and beverages you can eat. Contact a dietitian for more options. What foods should I avoid? Fruits Canned fruit in heavy syrup. Fruit in cream or butter sauce. Fried fruit. Limit coconut. Vegetables Vegetables cooked in cheese, cream, or butter sauce. Fried vegetables. Grains Breads made with saturated or trans fats, oils, or whole milk. Croissants. Sweet rolls. Donuts. High-fat crackers, such as cheese crackers and chips. Meats and other proteins Fatty meats, such as hot dogs, ribs, sausage, bacon, rib-eye roast or steak. High-fat deli meats, such as salami and bologna. Caviar. Domestic duck and goose. Organ meats, such as liver. Dairy Cream, sour cream, cream cheese, and creamed cottage cheese. Whole-milk cheeses. Whole or 2% milk (liquid, evaporated, or condensed). Whole buttermilk. Cream sauce or high-fat cheese sauce. Whole-milk yogurt. Fats and oils Meat fat, or shortening. Cocoa butter, hydrogenated oils, palm oil, coconut oil, palm kernel oil. Solid fats and shortenings, including bacon fat, salt pork, lard, and butter. Nondairy cream substitutes. Salad dressings with cheese or sour cream. Beverages Regular sodas and any drinks with added sugar. Sweets and desserts Frosting. Pudding. Cookies. Cakes. Pies. Milk chocolate or white chocolate. Buttered syrups. Full-fat ice cream or ice cream drinks. The items listed above may  not be a complete list of foods and beverages to avoid. Contact a dietitian for more information. Summary Heart-healthy meal planning includes limiting unhealthy fats, increasing healthy fats, limiting salt (sodium) intake and making other diet and lifestyle changes. Lose weight if you are overweight. Losing just  5-10% of your body weight can help your overall health and prevent diseases such as diabetes and heart disease. Focus on eating a balance of foods, including fruits and vegetables, low-fat or nonfat dairy, lean protein, nuts and legumes, whole grains, and heart-healthy oils and fats. This information is not intended to replace advice given to you by your health care provider. Make sure you discuss any questions you have with your health care provider. Document Revised: 09/30/2021 Document Reviewed: 09/30/2021 Elsevier Patient Education  2024 [employer].

## 2023-10-05 ENCOUNTER — Ambulatory Visit: Payer: BC Managed Care – PPO | Admitting: Nurse Practitioner

## 2023-10-05 ENCOUNTER — Encounter: Payer: Self-pay | Admitting: Nurse Practitioner

## 2023-10-05 VITALS — BP 123/76 | HR 78 | Temp 98.2°F | Ht 67.0 in | Wt 202.8 lb

## 2023-10-05 DIAGNOSIS — K219 Gastro-esophageal reflux disease without esophagitis: Secondary | ICD-10-CM

## 2023-10-05 DIAGNOSIS — R7309 Other abnormal glucose: Secondary | ICD-10-CM

## 2023-10-05 DIAGNOSIS — I10 Essential (primary) hypertension: Secondary | ICD-10-CM | POA: Diagnosis not present

## 2023-10-05 DIAGNOSIS — E66811 Obesity, class 1: Secondary | ICD-10-CM

## 2023-10-05 DIAGNOSIS — I7123 Aneurysm of the descending thoracic aorta, without rupture: Secondary | ICD-10-CM

## 2023-10-05 DIAGNOSIS — I25112 Atherosclerotic heart disease of native coronary artery with refractory angina pectoris: Secondary | ICD-10-CM

## 2023-10-05 DIAGNOSIS — Z6831 Body mass index (BMI) 31.0-31.9, adult: Secondary | ICD-10-CM

## 2023-10-05 DIAGNOSIS — E782 Mixed hyperlipidemia: Secondary | ICD-10-CM | POA: Diagnosis not present

## 2023-10-05 DIAGNOSIS — E6609 Other obesity due to excess calories: Secondary | ICD-10-CM

## 2023-10-05 LAB — BAYER DCA HB A1C WAIVED: HB A1C (BAYER DCA - WAIVED): 6.1 % — ABNORMAL HIGH (ref 4.8–5.6)

## 2023-10-05 LAB — MICROALBUMIN, URINE WAIVED
Creatinine, Urine Waived: 100 mg/dL (ref 10–300)
Microalb, Ur Waived: 10 mg/L (ref 0–19)
Microalb/Creat Ratio: <30 mg/g (ref <30)

## 2023-10-05 MED ORDER — HYDRALAZINE HCL 50 MG PO TABS: 50.0000 mg | ORAL_TABLET | Freq: Two times a day (BID) | ORAL | 4 refills | Status: DC

## 2023-10-05 MED ORDER — AMLODIPINE BESYLATE 10 MG PO TABS: 10.0000 mg | ORAL_TABLET | Freq: Every day | ORAL | 4 refills | Status: DC

## 2023-10-05 MED ORDER — MONTELUKAST SODIUM 10 MG PO TABS: 10.0000 mg | ORAL_TABLET | Freq: Every day | ORAL | 4 refills | Status: DC

## 2023-10-05 MED ORDER — PANTOPRAZOLE SODIUM 40 MG PO TBEC: 40.0000 mg | DELAYED_RELEASE_TABLET | Freq: Two times a day (BID) | ORAL | 4 refills | Status: DC

## 2023-10-05 MED ORDER — APIXABAN 5 MG PO TABS: 5.0000 mg | ORAL_TABLET | Freq: Two times a day (BID) | ORAL | 4 refills | Status: DC

## 2023-10-05 MED ORDER — EZETIMIBE 10 MG PO TABS: 10.0000 mg | ORAL_TABLET | Freq: Every day | ORAL | 4 refills | Status: DC

## 2023-10-05 MED ORDER — RANOLAZINE ER 1000 MG PO TB12: 1000.0000 mg | ORAL_TABLET | Freq: Two times a day (BID) | ORAL | 4 refills | Status: DC

## 2023-10-05 MED ORDER — ROSUVASTATIN CALCIUM 40 MG PO TABS: 40.0000 mg | ORAL_TABLET | Freq: Every day | ORAL | 4 refills | Status: DC

## 2023-10-05 MED ORDER — ICOSAPENT ETHYL 1 G PO CAPS: 2.0000 g | ORAL_CAPSULE | Freq: Two times a day (BID) | ORAL | 4 refills | Status: DC

## 2023-10-05 MED ORDER — HYDROCHLOROTHIAZIDE 25 MG PO TABS: 25.0000 mg | ORAL_TABLET | Freq: Every day | ORAL | 4 refills | Status: DC

## 2023-10-05 MED ORDER — LOSARTAN POTASSIUM 100 MG PO TABS: 100.0000 mg | ORAL_TABLET | Freq: Every day | ORAL | 4 refills | Status: DC

## 2023-10-05 NOTE — Assessment & Plan Note (Signed)
Chronic, ongoing.  Continue current medication regimen and adjust as needed. Lipid panel today.

## 2023-10-05 NOTE — Assessment & Plan Note (Addendum)
Chronic, stable with no recent chest pain.  Continue collaboration with cardiology and reviewed recent notes and labs.  Refills sent in.

## 2023-10-05 NOTE — Assessment & Plan Note (Signed)
Noted on imaging March 2015.  However, since then imaging has not shown this, last seen and stable on 2019 imaging -- from there no report of this on imaging.  Continue to monitor and may benefit repeat imaging in one year.

## 2023-10-05 NOTE — Assessment & Plan Note (Signed)
Chronic, stable.  BP remains at goal.  Recommend he monitor BP at least a few mornings a week at home and document.  DASH diet at home.  Continue current medication regimen and adjust as needed.  Labs today: CMP and lipid.  Continue collaboration with cardiology, recent notes reviewed.

## 2023-10-05 NOTE — Assessment & Plan Note (Signed)
BMI 31.76.  Recommended eating smaller high protein, low fat meals more frequently and exercising 30 mins a day 5 times a week with a goal of 10-15lb weight loss in the next 3 months. Patient voiced their understanding and motivation to adhere to these recommendations.

## 2023-10-05 NOTE — Assessment & Plan Note (Signed)
Chronic, ongoing.  Well-controlled with PPI, has been on for years.  Tried reductions without success.  Risks of PPI use were discussed with patient including bone loss, C. Diff diarrhea, pneumonia, infections, CKD, electrolyte abnormalities. Verbalizes understanding and chooses to continue the medication.  Mag level annually.

## 2023-10-05 NOTE — Assessment & Plan Note (Signed)
Ongoing, A1c 6.1% today, slight trend down from 6.2%.  Recommend continue focus on diet and exercise. Urine ALB 18 September 2023.

## 2023-10-05 NOTE — Progress Notes (Signed)
BP 123/76   Pulse 78   Temp 98.2 F (36.8 C) (Oral)   Ht 5\' 7"  (1.702 m)   Wt 202 lb 12.8 oz (92 kg)   SpO2 98%   BMI 31.76 kg/m    Subjective:    Patient ID: Jaime [name], male    DOB: Dec 20, 1960, 63 y.o.   MRN: [id number]  HPI: Jaime [name] is a 63 y.o. male  Chief Complaint  Patient presents with   Hypertension   Hyperlipidemia   Prediabetes   HYPERTENSION / HYPERLIPIDEMIA Takes Amlodipine, Hydralazine, Losartan, Vascepa, Crestor, Zetia, Ranexa.  Follows with cardiology with last visit on 01/20/23.  Continues on Eliquis for history of DVT -- currently off of this due to carpal tunnel surgery.  There is dx of thoracic aortic mild dilation noted on imaging 11/17/2013, however recent imaging has not noted this present. Satisfied with current treatment? yes Duration of hypertension: chronic BP monitoring frequency: rarely BP range:  BP medication side effects: no Duration of hyperlipidemia: chronic Cholesterol medication side effects: no Cholesterol supplements: none Medication compliance: good compliance Aspirin: yes Recent stressors: no Recurrent headaches: no Visual changes: no Palpitations: no Dyspnea: no Chest pain: no Lower extremity edema: no Dizzy/lightheaded: no   Impaired Fasting Glucose HbA1C:  Lab Results  Component Value Date   HGBA1C 6.2 (H) 01/05/2023  Duration of elevated blood sugar:  Polydipsia: yes Polyuria: yes Weight change: no Visual disturbance: no Glucose Monitoring: no    Accucheck frequency: Not Checking    Fasting glucose:     Post prandial:  Diabetic Education: Not Completed Family history of diabetes: no   GERD Continues on Protonix.  Taken this since 2012 -- has tried to come off and this caused worsening symptoms in past.   GERD control status: stable Satisfied with current treatment? yes Heartburn frequency:  Medication side effects: no  Medication compliance: stable Dysphagia: no Odynophagia:  no Hematemesis:  no Blood in stool: no EGD: yes   Relevant past medical, surgical, family and social history reviewed and updated as indicated. Interim medical history since our last visit reviewed. Allergies and medications reviewed and updated.  Review of Systems  Constitutional:  Negative for activity change, diaphoresis, fatigue and fever.  Respiratory:  Negative for cough, chest tightness, shortness of breath and wheezing.   Cardiovascular:  Negative for chest pain, palpitations and leg swelling.  Gastrointestinal: Negative.   Neurological: Negative.   Psychiatric/Behavioral: Negative.     Per HPI unless specifically indicated above     Objective:    BP 123/76   Pulse 78   Temp 98.2 F (36.8 C) (Oral)   Ht 5\' 7"  (1.702 m)   Wt 202 lb 12.8 oz (92 kg)   SpO2 98%   BMI 31.76 kg/m   Wt Readings from Last 3 Encounters:  10/05/23 202 lb 12.8 oz (92 kg)  09/24/23 200 lb (90.7 kg)  08/05/23 194 lb (88 kg)    Physical Exam Vitals and nursing note reviewed.  Constitutional:      General: He is awake. He is not in acute distress.    Appearance: He is well-developed and well-groomed. He is obese. He is not ill-appearing or toxic-appearing.  HENT:     Head: Normocephalic.     Right Ear: Hearing and external ear normal.     Left Ear: Hearing and external ear normal.  Eyes:     General: Lids are normal.     Extraocular Movements: Extraocular movements intact.  Conjunctiva/sclera: Conjunctivae normal.  Neck:     Thyroid: No thyromegaly.     Vascular: No carotid bruit.  Cardiovascular:     Rate and Rhythm: Normal rate and regular rhythm.     Heart sounds: Normal heart sounds.  Pulmonary:     Effort: No accessory muscle usage or respiratory distress.     Breath sounds: Normal breath sounds.  Abdominal:     General: Bowel sounds are normal. There is no distension.     Palpations: Abdomen is soft.     Tenderness: There is no abdominal tenderness.  Musculoskeletal:     Cervical back:  Full passive range of motion without pain.     Right lower leg: No edema.     Left lower leg: No edema.     Comments: Cast present to left lower arm.    Lymphadenopathy:     Cervical: No cervical adenopathy.  Skin:    General: Skin is warm.     Capillary Refill: Capillary refill takes less than 2 seconds.  Neurological:     Mental Status: He is alert and oriented to person, place, and time.     Deep Tendon Reflexes: Reflexes are normal and symmetric.     Reflex Scores:      Brachioradialis reflexes are 2+ on the right side and 2+ on the left side.      Patellar reflexes are 2+ on the right side and 2+ on the left side. Psychiatric:        Attention and Perception: Attention normal.        Mood and Affect: Mood normal.        Speech: Speech normal.        Behavior: Behavior normal. Behavior is cooperative.        Thought Content: Thought content normal.    Results for orders placed or performed during the hospital encounter of 09/25/23  Basic metabolic panel per protocol   Collection Time: 09/25/23  8:27 AM  Result Value Ref Range   Sodium 140 135 - 145 mmol/L   Potassium 3.4 (L) 3.5 - 5.1 mmol/L   Chloride 102 98 - 111 mmol/L   CO2 29 22 - 32 mmol/L   Glucose, Bld 87 70 - 99 mg/dL   BUN 15 8 - 23 mg/dL   Creatinine, Ser 1.47 0.61 - 1.24 mg/dL   Calcium 9.2 8.9 - 82.9 mg/dL   GFR, Estimated >56 >21 mL/min   Anion gap 9 5 - 15      Assessment & Plan:   Problem List Items Addressed This Visit       Cardiovascular and Mediastinum   Aneurysm of thoracic aorta (HCC)   Noted on imaging March 2015.  However, since then imaging has not shown this, last seen and stable on 2019 imaging -- from there no report of this on imaging.  Continue to monitor and may benefit repeat imaging in one year.      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  Continue collaboration with cardiology and reviewed recent notes and labs.  Refills sent in.      Relevant Medications   apixaban (ELIQUIS) 5 MG TABS tablet   amLODipine (NORVASC) 10 MG tablet   ezetimibe (ZETIA) 10 MG tablet   hydrALAZINE (APRESOLINE) 50 MG tablet   hydrochlorothiazide (HYDRODIURIL) 25 MG tablet   icosapent Ethyl (VASCEPA) 1 g capsule   ranolazine (RANEXA) 1000 MG SR tablet   rosuvastatin (CRESTOR) 40 MG tablet   losartan (COZAAR) 100 MG tablet   Primary hypertension   Chronic, stable.  BP remains at goal.  Recommend he monitor BP at least a few mornings a week at home and document.  DASH diet at home.  Continue current medication regimen and adjust as needed.  Labs today: CMP and lipid.  Continue collaboration with cardiology, recent notes reviewed.       Relevant Medications   apixaban (ELIQUIS) 5 MG TABS tablet   amLODipine (NORVASC) 10 MG tablet   ezetimibe (ZETIA) 10 MG tablet   hydrALAZINE (APRESOLINE) 50 MG tablet   hydrochlorothiazide (HYDRODIURIL) 25 MG tablet   icosapent Ethyl (VASCEPA) 1 g capsule   ranolazine (RANEXA) 1000 MG SR tablet   rosuvastatin (CRESTOR) 40 MG tablet   losartan (COZAAR) 100 MG tablet   Other Relevant Orders   Microalbumin, Urine Waived   Comprehensive metabolic panel   TSH     Digestive   GERD without esophagitis   Chronic, ongoing.  Well-controlled with PPI, has been on for years.  Tried reductions without success.  Risks of PPI use were discussed with patient including bone loss, C. Diff diarrhea, pneumonia, infections, CKD, electrolyte abnormalities. Verbalizes understanding and  chooses to continue the medication.  Mag level annually.       Relevant Medications   pantoprazole (PROTONIX) 40 MG tablet   Other Relevant Orders   Magnesium     Other   Elevated hemoglobin A1c measurement   Ongoing, A1c 6.1% today, slight trend down from 6.2%.  Recommend continue focus on diet and exercise. Urine ALB 18 September 2023.      Relevant Orders   Bayer DCA Hb A1c Waived   Microalbumin, Urine Waived   Hyperlipidemia   Chronic, ongoing.  Continue current medication regimen and adjust as needed.  Lipid panel today.      Relevant Medications   apixaban (ELIQUIS) 5 MG TABS tablet   amLODipine (NORVASC) 10 MG tablet   ezetimibe (ZETIA) 10 MG tablet   hydrALAZINE (APRESOLINE) 50 MG tablet   hydrochlorothiazide (HYDRODIURIL) 25 MG tablet   icosapent Ethyl (VASCEPA) 1 g capsule   ranolazine (RANEXA) 1000 MG SR tablet   rosuvastatin (CRESTOR) 40 MG tablet   losartan (COZAAR) 100 MG tablet   Other Relevant Orders   Lipid Panel w/o Chol/HDL Ratio   Obesity   BMI 40.98.  Recommended eating smaller high protein, low fat meals more frequently and exercising 30 mins a day 5 times a week with a goal of 10-15lb weight loss in the next 3 months. Patient voiced their understanding and motivation to adhere to these recommendations.         Follow up plan: Return in about 6 months (around 04/03/2024) for Annual Physical.

## 2023-10-06 ENCOUNTER — Other Ambulatory Visit: Payer: Self-pay | Admitting: Nurse Practitioner

## 2023-10-06 ENCOUNTER — Encounter: Payer: Self-pay | Admitting: Nurse Practitioner

## 2023-10-06 DIAGNOSIS — R7989 Other specified abnormal findings of blood chemistry: Secondary | ICD-10-CM

## 2023-10-06 LAB — LIPID PANEL W/O CHOL/HDL RATIO
Cholesterol, Total: 143 mg/dL (ref 100–199)
HDL: 47 mg/dL (ref >39)
LDL Chol Calc (NIH): 83 mg/dL (ref 0–99)
Triglycerides: 63 mg/dL (ref 0–149)
VLDL Cholesterol Cal: 13 mg/dL (ref 5–40)

## 2023-10-06 LAB — COMPREHENSIVE METABOLIC PANEL
ALT: 25 [IU]/L (ref 0–44)
AST: 17 [IU]/L (ref 0–40)
Albumin: 4.3 g/dL (ref 3.9–4.9)
Alkaline Phosphatase: 83 [IU]/L (ref 44–121)
BUN/Creatinine Ratio: 16 (ref 10–24)
BUN: 17 mg/dL (ref 8–27)
Bilirubin Total: 0.4 mg/dL (ref 0.0–1.2)
CO2: 26 mmol/L (ref 20–29)
Calcium: 9.5 mg/dL (ref 8.6–10.2)
Chloride: 98 mmol/L (ref 96–106)
Creatinine, Ser: 1.07 mg/dL (ref 0.76–1.27)
Globulin, Total: 2.8 g/dL (ref 1.5–4.5)
Glucose: 131 mg/dL — ABNORMAL HIGH (ref 70–99)
Potassium: 4.2 mmol/L (ref 3.5–5.2)
Sodium: 137 mmol/L (ref 134–144)
Total Protein: 7.1 g/dL (ref 6.0–8.5)
eGFR: 79 mL/min/{1.73_m2} (ref >59)

## 2023-10-06 LAB — TSH: TSH: 0.327 u[IU]/mL — ABNORMAL LOW (ref 0.450–4.500)

## 2023-10-06 LAB — MAGNESIUM: Magnesium: 2.1 mg/dL (ref 1.6–2.3)

## 2023-10-06 NOTE — Progress Notes (Signed)
Contacted via MyChart == needs lab only visit in 6 weeks please Good morning Jaime [name], your labs have returned and overall are stable with exception of TSH, thyroid lab, being a little low.  This has happened in past too.  I would like to recheck on outpatient labs in 6 weeks.  Staff will call to schedule this.  Any questions? Keep being stellar!!  Thank you for allowing me to participate in your care.  I appreciate you. Kindest regards, Devesh Monforte

## 2023-10-09 ENCOUNTER — Ambulatory Visit: Payer: BC Managed Care – PPO | Attending: Cardiology | Admitting: Cardiology

## 2023-10-09 ENCOUNTER — Encounter: Payer: Self-pay | Admitting: Cardiology

## 2023-10-09 VITALS — BP 124/76 | HR 65 | Ht 67.0 in | Wt 203.8 lb

## 2023-10-09 DIAGNOSIS — E7800 Pure hypercholesterolemia, unspecified: Secondary | ICD-10-CM

## 2023-10-09 DIAGNOSIS — I2510 Atherosclerotic heart disease of native coronary artery without angina pectoris: Secondary | ICD-10-CM

## 2023-10-09 DIAGNOSIS — I10 Essential (primary) hypertension: Secondary | ICD-10-CM

## 2023-10-09 NOTE — Progress Notes (Signed)
Cardiology Office Note:    Date:  10/09/2023   ID:  Jaime [name], DOB December 02, 1960, MRN [id number]  PCP:  Marjie Skiff, NP   [facility] Providers Cardiologist:  Debbe Odea, MD     Referring MD: Marjie Skiff, NP   Chief Complaint  Patient presents with   Follow-up    Patient denies new or acute cardiac problems/concerns today.      History of Present Illness:    Jaime [name] is a 63 y.o. male with a hx of CAD s/p PCI to LAD in 2012, hypertension, hyperlipidemia, DVT on Eliquis who presents for follow-up.   Denies chest pain or shortness of breath.  Started on Ranexa for antianginal benefit.  Coreg was previously stopped due to bradycardia.  Blood pressure is adequately controlled on current medications.  Denies edema.  Feels well, no concerns at this time.  Takes Eliquis due to history of DVT.  Prior notes Coreg stopped due to bradycardia.  Heart rates in 40s. Echo 1/23 EF 55 to 60% Has a history of left lower extremity DVT after left knee surgery.  He was treated with Coumadin for a year.  Diagnosed with COVID-19 04/27/2020, developed right lower extremity DVT.  Was started on Eliquis.  He was told he would likely be on Eliquis long-term.  He took Eliquis for about 30 days and then stop.  Did not refill.  Being seen by hematology.  Past Medical History:  Diagnosis Date   Allergy    Aneurysm of thoracic aorta (HCC)    Aortic atherosclerosis (HCC)    Ascending aorta dilatation (HCC)    a.) TTE 09/18/2021: asc Ao 39 mm   BPH (benign prostatic hyperplasia)    Bradycardia    Carpal tunnel syndrome of left wrist 09/2023   Coronary artery disease 2012   a.) MI (unknown type) 2012 --> PCI placing stent (unknown type) to mLAD; b.) LHC 10/01/2013: 40-50% pRCA - med mgmt; c.) LHC 10/22/2015: 30% pLAD, 10% ISR mLAD, 30-40% pRCA - med mgmt   ED (erectile dysfunction)    a.) on PDE5i (tadalafil)   Essential hypertension    GERD (gastroesophageal reflux disease)     Hepatic steatosis    History of 2019 novel coronavirus disease (COVID-19) 04/27/2020   History of echocardiogram    a.) TTE 09/18/2021: EF 55-60%, no RWMAs, normal RVSF, triv MR, mild AR, asc Ao 39 mm   Hyperlipidemia    Incarcerated umbilical hernia    Kidney stones    Left carpal tunnel syndrome    a.) s/p release   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)    a.) last was in 2021; provoked in setting of surgery/hospitalization and concurrent SARS-CoV-2; b.) evaluated by hematology 06/2021; hypercoagulable work up NEGATIVE (antiphospholipid syndrome panel (-), normal protein S antigen, normal protein C activity, normal ATIII, Factor V Leiden mutation (-), prothrombin gene mutation (-)   Myocardial infarction (HCC) 2012   a.) details unclear; Tx'd in [place], [place] --> PCI of mLAD (unknown type stent)   On apixaban therapy    a.) hematology advised no need for lifelong anticoagulation (06/2021); patient notes that he was told  in [facility] that lifelong OAC would be required; (+) DVT formation off of OAC in the past per his report; patient and PCP have elected to continue therapy   Pure hypercholesterolemia    Right inguinal hernia    Seborrheic dermatitis     Past Surgical History:  Procedure Laterality Date   ANTERIOR  CRUCIATE LIGAMENT REPAIR Left 1999   ANTERIOR INTEROSSEOUS NERVE DECOMPRESSION Left 10/01/2023   Procedure: Carpal Tunnel Release;  Surgeon: Deeann Saint, MD;  Location: [facility] ORS;  Service: Orthopedics;  Laterality: Left;   CARPAL TUNNEL RELEASE Right 2018   CATARACT EXTRACTION W/ INTRAOCULAR LENS IMPLANT Left 08/24/2023   CATARACT EXTRACTION W/ INTRAOCULAR LENS IMPLANT Right 08/10/2023   COLONOSCOPY     2014, 2018, 2021   CORONARY ANGIOPLASTY WITH STENT PLACEMENT  2012   EXTRACORPOREAL SHOCK WAVE LITHOTRIPSY Left 01/30/2022   Procedure: EXTRACORPOREAL SHOCK WAVE LITHOTRIPSY (ESWL);  Surgeon: Riki Altes, MD;  Location: [facility] ORS;  Service: Urology;   Laterality: Left;   INSERTION OF MESH Right 07/23/2023   Procedure: INSERTION OF MESH;  Surgeon: Henrene Dodge, MD;  Location: [facility] ORS;  Service: General;  Laterality: Right;  umbilical hernia mesh also   TOTAL KNEE ARTHROPLASTY Right 2019   TOTAL KNEE ARTHROPLASTY Left 2017   UMBILICAL HERNIA REPAIR N/A 07/23/2023   Procedure: HERNIA REPAIR UMBILICAL ADULT, open;  Surgeon: Henrene Dodge, MD;  Location: [facility] ORS;  Service: General;  Laterality: N/A;    Current Medications: Current Meds  Medication Sig   acetaminophen (TYLENOL) 325 MG tablet Take 2 tablets (650 mg total) by mouth every 6 (six) hours as needed for mild pain (pain score 1-3).   amLODipine (NORVASC) 10 MG tablet Take 1 tablet (10 mg total) by mouth daily.   apixaban (ELIQUIS) 5 MG TABS tablet Take 1 tablet (5 mg total) by mouth 2 (two) times daily.   Desoximetasone 0.25 % LIQD Apply to scalp daily for 2 weeks and then only use as needed for itchy scalp (no more then twice weekly).   ezetimibe (ZETIA) 10 MG tablet Take 1 tablet (10 mg total) by mouth daily.   gabapentin (NEURONTIN) 400 MG capsule Take 1 capsule (400 mg total) by mouth 3 (three) times daily.   hydrALAZINE (APRESOLINE) 50 MG tablet Take 1 tablet (50 mg total) by mouth in the morning and at bedtime.   hydrochlorothiazide (HYDRODIURIL) 25 MG tablet Take 1 tablet (25 mg total) by mouth daily.   HYDROcodone-acetaminophen (NORCO) 5-325 MG tablet Take 1-2 tablets by mouth every 6 (six) hours as needed.   hydrOXYzine (ATARAX) 25 MG tablet Take 25 mg by mouth daily.   icosapent Ethyl (VASCEPA) 1 g capsule Take 2 capsules (2 g total) by mouth 2 (two) times daily.   ketoconazole (NIZORAL) 2 % shampoo Apply 1 Application topically 2 (two) times a week.   losartan (COZAAR) 100 MG tablet Take 1 tablet (100 mg total) by mouth daily.   mometasone (ELOCON) 0.1 % cream Apply 1 Application topically daily.   montelukast (SINGULAIR) 10 MG tablet Take 1 tablet (10 mg total) by mouth  daily.   nitroGLYCERIN (NITROSTAT) 0.4 MG SL tablet Place 1 tablet (0.4 mg total) under the tongue every 5 (five) minutes as needed for chest pain. For a maximum of 3 doses in 24 hours.   pantoprazole (PROTONIX) 40 MG tablet Take 1 tablet (40 mg total) by mouth in the morning and at bedtime. Need appt for further refills   ranolazine (RANEXA) 1000 MG SR tablet Take 1 tablet (1,000 mg total) by mouth 2 (two) times daily.   rosuvastatin (CRESTOR) 40 MG tablet Take 1 tablet (40 mg total) by mouth daily.   tadalafil (CIALIS) 20 MG tablet Take 1 tablet (20 mg total) by mouth daily as needed for erectile dysfunction.     Allergies:   Codeine  Social History   Socioeconomic History   Marital status: Married    Spouse name: Coralee Rud   Number of children: 3   Years of education: Not on file   Highest education level: 12th grade  Occupational History   Not on file  Tobacco Use   Smoking status: Never   Smokeless tobacco: Never  Vaping Use   Vaping status: Never Used  Substance and Sexual Activity   Alcohol use: Yes    Comment: RARE Beer- 1 to 2 a month   Drug use: Never   Sexual activity: Yes    Birth control/protection: None  Other Topics Concern   Not on file  Social History Narrative   Not on file   Social Drivers of Health   Financial Resource Strain: Low Risk  (10/04/2023)   Overall Financial Resource Strain (CARDIA)    Difficulty of Paying Living Expenses: Not very hard  Food Insecurity: No Food Insecurity (10/04/2023)   Hunger Vital Sign    Worried About Running Out of Food in the Last Year: Never true    Ran Out of Food in the Last Year: Never true  Transportation Needs: No Transportation Needs (10/04/2023)   PRAPARE - Administrator, Civil Service (Medical): No    Lack of Transportation (Non-Medical): No  Physical Activity: Insufficiently Active (10/04/2023)   Exercise Vital Sign    Days of Exercise per Week: 3 days    Minutes of Exercise per Session:  20 min  Stress: No Stress Concern Present (10/04/2023)   [employer] of Occupational Health - Occupational Stress Questionnaire    Feeling of Stress : Only a little  Social Connections: Moderately Integrated (10/04/2023)   Social Connection and Isolation Panel [NHANES]    Frequency of Communication with Friends and Family: Three times a week    Frequency of Social Gatherings with Friends and Family: Once a week    Attends Religious Services: More than 4 times per year    Active Member of [employer] or Organizations: No    Attends Engineer, structural: Not on file    Marital Status: Married     Family History: The patient's family history includes Cancer in his father; Heart attack in his paternal aunt; Pancreatic cancer in his mother.  ROS:   Please see the history of present illness.     All other systems reviewed and are negative.  EKGs/Labs/Other Studies Reviewed:    The following studies were reviewed today:   Recent Labs: 07/03/2023: Hemoglobin 16.0; Platelets 223 10/05/2023: ALT 25; BUN 17; Creatinine, Ser 1.07; Magnesium 2.1; Potassium 4.2; Sodium 137; TSH 0.327  Recent Lipid Panel    Component Value Date/Time   CHOL 143 10/05/2023 0822   TRIG 63 10/05/2023 0822   HDL 47 10/05/2023 0822   CHOLHDL 2.9 07/22/2021 0740   LDLCALC 83 10/05/2023 0822     Risk Assessment/Calculations:        Physical Exam:    VS:  BP 124/76 (BP Location: Left Arm, Patient Position: Sitting, Cuff Size: Normal)   Pulse 65   Ht 5\' 7"  (1.702 m)   Wt 203 lb 12.8 oz (92.4 kg)   SpO2 96%   BMI 31.92 kg/m     Wt Readings from Last 3 Encounters:  10/09/23 203 lb 12.8 oz (92.4 kg)  10/05/23 202 lb 12.8 oz (92 kg)  09/24/23 200 lb (90.7 kg)     GEN:  Well nourished, well developed in mild to moderate distress  HEENT: Normal NECK: No JVD; No carotid bruits CARDIAC: Regular, bradycardic. RESPIRATORY:  Clear to auscultation without rales, wheezing or rhonchi  ABDOMEN: Soft,  non-tender, non-distended MUSCULOSKELETAL:  No edema; No deformity  SKIN: Warm and dry NEUROLOGIC:  Alert and oriented x 3 PSYCHIATRIC:  Normal affect   ASSESSMENT:    1. Coronary artery disease involving native heart without angina pectoris, unspecified vessel or lesion type   2. Primary hypertension   3. Pure hypercholesterolemia     PLAN:    In order of problems listed above:  CAD s/p PCI to LAD 2012.  Denies chest pain.  Echo 09/2021 EF 55 to 60%.  On Eliquis for DVT.  Continue Crestor, Ranexa.  History of bradycardia with beta-blocker. Hypertension, BP controlled.   Continue hydralazine 50 mg twice daily,  Continue Norvasc 10 mg daily, losartan 100 mg daily, hydrochlorothiazide 25 mg daily.   Hyperlipidemia, cholesterol controlled.  Continue Zetia, Crestor, Vascepa.  Follow-up in 12 months.      Medication Adjustments/Labs and Tests Ordered: Current medicines are reviewed at length with the patient today.  Concerns regarding medicines are outlined above.  No orders of the defined types were placed in this encounter.   No orders of the defined types were placed in this encounter.    Patient Instructions  Medication Instructions:   Your physician recommends that you continue on your current medications as directed. Please refer to the Current Medication list given to you today.  *If you need a refill on your cardiac medications before your next appointment, please call your pharmacy*   Lab Work:  None Ordered  If you have labs (blood work) drawn today and your tests are completely normal, you will receive your results only by: MyChart Message (if you have MyChart) OR A paper copy in the mail If you have any lab test that is abnormal or we need to change your treatment, we will call you to review the results.   Testing/Procedures:  None Ordered   Follow-Up: At [facility], you and your health needs are our priority.  As part of our continuing  mission to provide you with exceptional heart care, we have created designated Provider Care Teams.  These Care Teams include your primary Cardiologist (physician) and Advanced Practice Providers (APPs -  Physician Assistants and Nurse Practitioners) who all work together to provide you with the care you need, when you need it.  We recommend signing up for the patient portal called "MyChart".  Sign up information is provided on this After Visit Summary.  MyChart is used to connect with patients for Virtual Visits (Telemedicine).  Patients are able to view lab/test results, encounter notes, upcoming appointments, etc.  Non-urgent messages can be sent to your provider as well.   To learn more about what you can do with MyChart, go to [email or web address].    Your next appointment:   1 year(s)  Provider:   You may see Debbe Odea, MD or one of the following Advanced Practice Providers on your designated Care Team:   Nicolasa Ducking, NP Eula Listen, PA-C Cadence Fransico Michael, PA-C Charlsie Quest, NP Carlos Levering, NP     Signed, Debbe Odea, MD  10/09/2023 10:47 AM    [facility]

## 2023-10-09 NOTE — Patient Instructions (Signed)
 Medication Instructions:   Your physician recommends that you continue on your current medications as directed. Please refer to the Current Medication list given to you today.  *If you need a refill on your cardiac medications before your next appointment, please call your pharmacy*   Lab Work:  None Ordered  If you have labs (blood work) drawn today and your tests are completely normal, you will receive your results only by: MyChart Message (if you have MyChart) OR A paper copy in the mail If you have any lab test that is abnormal or we need to change your treatment, we will call you to review the results.   Testing/Procedures:  None Ordered   Follow-Up: At [facility], you and your health needs are our priority.  As part of our continuing mission to provide you with exceptional heart care, we have created designated Provider Care Teams.  These Care Teams include your primary Cardiologist (physician) and Advanced Practice Providers (APPs -  Physician Assistants and Nurse Practitioners) who all work together to provide you with the care you need, when you need it.  We recommend signing up for the patient portal called "MyChart".  Sign up information is provided on this After Visit Summary.  MyChart is used to connect with patients for Virtual Visits (Telemedicine).  Patients are able to view lab/test results, encounter notes, upcoming appointments, etc.  Non-urgent messages can be sent to your provider as well.   To learn more about what you can do with MyChart, go to [email or web address].    Your next appointment:   1 year(s)  Provider:   You may see Debbe Odea, MD or one of the following Advanced Practice Providers on your designated Care Team:   Nicolasa Ducking, NP Eula Listen, PA-C Cadence Fransico Michael, PA-C Charlsie Quest, NP Carlos Levering, NP

## 2023-11-11 ENCOUNTER — Other Ambulatory Visit: Payer: Self-pay

## 2023-11-11 MED ORDER — DESOXIMETASONE 0.25 % EX LIQD: CUTANEOUS | 1 refills | Status: DC

## 2023-11-20 ENCOUNTER — Other Ambulatory Visit: Payer: BC Managed Care – PPO

## 2023-11-20 DIAGNOSIS — R7989 Other specified abnormal findings of blood chemistry: Secondary | ICD-10-CM

## 2023-11-21 ENCOUNTER — Encounter: Payer: Self-pay | Admitting: Nurse Practitioner

## 2023-11-21 LAB — TSH: TSH: 0.622 u[IU]/mL (ref 0.450–4.500)

## 2023-11-21 LAB — T4, FREE: Free T4: 1.32 ng/dL (ref 0.82–1.77)

## 2023-11-21 NOTE — Progress Notes (Signed)
 Contacted via MyChart   Thyroid labs normal this check.  No changes needed.:)

## 2024-03-07 ENCOUNTER — Telehealth: Payer: Self-pay

## 2024-03-07 ENCOUNTER — Other Ambulatory Visit (HOSPITAL_COMMUNITY): Payer: Self-pay

## 2024-03-07 MED ORDER — OMEGA-3-ACID ETHYL ESTERS 1 G PO CAPS: 2.0000 g | ORAL_CAPSULE | Freq: Two times a day (BID) | ORAL | 3 refills | Status: DC

## 2024-03-07 NOTE — Telephone Encounter (Signed)
 Pharmacy Patient Advocate Encounter   Received notification from CoverMyMeds that prior authorization for Icosapent  Ethyl 1GM capsules is required/requested.   Insurance verification completed.   The patient is insured through [facility] .   Per test claim:  Omega 3 acid capsules is preferred by the insurance.  If suggested medication is appropriate, Please send in a new RX and discontinue this one. If not, please advise as to why it's not appropriate so that we may request a Prior Authorization. Please note, some preferred medications may still require a PA.  If the suggested medications have not been trialed and there are no contraindications to their use, the PA will not be submitted, as it will not be approved.

## 2024-03-07 NOTE — Addendum Note (Signed)
 Addended by: Sarai January T on: 03/07/2024 03:29 PM   Modules accepted: Orders

## 2024-03-07 NOTE — Telephone Encounter (Signed)
 Sent in Omega 3 capsules.

## 2024-03-17 ENCOUNTER — Other Ambulatory Visit: Payer: Self-pay | Admitting: Urology

## 2024-03-17 DIAGNOSIS — N528 Other male erectile dysfunction: Secondary | ICD-10-CM

## 2024-04-18 ENCOUNTER — Encounter: Payer: Self-pay | Admitting: Nurse Practitioner

## 2024-05-04 ENCOUNTER — Other Ambulatory Visit: Payer: Self-pay | Admitting: Nurse Practitioner

## 2024-05-04 NOTE — Telephone Encounter (Unsigned)
 Copied from CRM #[id number]. Topic: Clinical - Medication Refill >> May 04, 2024  1:54 PM Edsel HERO wrote: Medication:  omega-3 acid ethyl esters (LOVAZA ) 1 g capsule pantoprazole  (PROTONIX ) 40 MG tablet  Has the patient contacted their pharmacy? No (Agent: If no, request that the patient contact the pharmacy for the refill. If patient does not wish to contact the pharmacy document the reason why and proceed with request.) (Agent: If yes, when and what did the pharmacy advise?)  This is the patient's preferred pharmacy:  [facility] [address] - [place], [place] - [address] [facility] [address] [place] [address] Phone: [phone] Fax: [phone]  Is this the correct pharmacy for this prescription? Yes If no, delete pharmacy and type the correct one.   Has the prescription been filled recently? Yes  Is the patient out of the medication? Yes  Has the patient been seen for an appointment in the last year OR does the patient have an upcoming appointment? Yes  Can we respond through MyChart? Yes  Agent: Please be advised that Rx refills may take up to 3 business days. We ask that you follow-up with your pharmacy.

## 2024-05-06 NOTE — Telephone Encounter (Signed)
 Requested Prescriptions  Refused Prescriptions Disp Refills   omega-3 acid ethyl esters (LOVAZA ) 1 g capsule 180 capsule 3    Sig: Take 2 capsules (2 g total) by mouth 2 (two) times daily.     Endocrinology:  Nutritional Agents - omega-3 acid ethyl esters Failed - 05/06/2024 10:23 AM      Failed - Valid encounter within last 12 months    Recent Outpatient Visits   None            Failed - Lipid Panel in normal range within the last 12 months    Cholesterol, Total  Date Value Ref Range Status  10/05/2023 143 100 - 199 mg/dL Final   LDL Chol Calc (NIH)  Date Value Ref Range Status  10/05/2023 83 0 - 99 mg/dL Final   HDL  Date Value Ref Range Status  10/05/2023 47 >39 mg/dL Final   Triglycerides  Date Value Ref Range Status  10/05/2023 63 0 - 149 mg/dL Final          pantoprazole  (PROTONIX ) 40 MG tablet 180 tablet 4    Sig: Take 1 tablet (40 mg total) by mouth in the morning and at bedtime. Need appt for further refills     Gastroenterology: Proton Pump Inhibitors Failed - 05/06/2024 10:23 AM      Failed - Valid encounter within last 12 months    Recent Outpatient Visits   None

## 2024-05-06 NOTE — Telephone Encounter (Signed)
 Called pharmacy  - refills are already being prepared.

## 2024-06-02 ENCOUNTER — Other Ambulatory Visit: Payer: Self-pay | Admitting: Nurse Practitioner

## 2024-06-03 NOTE — Telephone Encounter (Signed)
 Requested medication (s) are due for refill today: yes  Requested medication (s) are on the active medication list: yes  Last refill:  11/11/23  Future visit scheduled: no  Notes to clinic:  Unable to refill per protocol, cannot delegate.      Requested Prescriptions  Pending Prescriptions Disp Refills   Desoximetasone  0.25 % LIQD [Pharmacy Med Name: DESOXIMETASONE  0.25% SPRAY] 100 mL 1    Sig: APPLY TO SCALP DAILY FOR 2 WEEKS AND THEN ONLY USE AS NEEDED FOR ITCHY SCALP (NO MORE THAN TWICE WEEKLY)     Not Delegated - Dermatology:  Corticosteroids Failed - 06/03/2024 12:22 PM      Failed - This refill cannot be delegated      Failed - Valid encounter within last 12 months    Recent Outpatient Visits   None

## 2024-06-15 ENCOUNTER — Encounter: Admitting: Nurse Practitioner

## 2024-07-13 ENCOUNTER — Encounter: Admitting: Pediatrics

## 2024-07-20 ENCOUNTER — Encounter: Admitting: Nurse Practitioner

## 2024-07-31 NOTE — Patient Instructions (Signed)

## 2024-08-03 ENCOUNTER — Ambulatory Visit (INDEPENDENT_AMBULATORY_CARE_PROVIDER_SITE_OTHER): Admitting: Nurse Practitioner

## 2024-08-03 ENCOUNTER — Encounter: Payer: Self-pay | Admitting: Nurse Practitioner

## 2024-08-03 VITALS — BP 121/76 | HR 66 | Temp 98.0°F | Ht 67.0 in | Wt 204.6 lb

## 2024-08-03 DIAGNOSIS — K219 Gastro-esophageal reflux disease without esophagitis: Secondary | ICD-10-CM

## 2024-08-03 DIAGNOSIS — Z86718 Personal history of other venous thrombosis and embolism: Secondary | ICD-10-CM

## 2024-08-03 DIAGNOSIS — I10 Essential (primary) hypertension: Secondary | ICD-10-CM

## 2024-08-03 DIAGNOSIS — I25112 Atherosclerotic heart disease of native coronary artery with refractory angina pectoris: Secondary | ICD-10-CM | POA: Diagnosis not present

## 2024-08-03 DIAGNOSIS — E66811 Obesity, class 1: Secondary | ICD-10-CM

## 2024-08-03 DIAGNOSIS — I7123 Aneurysm of the descending thoracic aorta, without rupture: Secondary | ICD-10-CM | POA: Diagnosis not present

## 2024-08-03 DIAGNOSIS — Z0000 Encounter for general adult medical examination without abnormal findings: Secondary | ICD-10-CM | POA: Diagnosis not present

## 2024-08-03 DIAGNOSIS — E782 Mixed hyperlipidemia: Secondary | ICD-10-CM | POA: Diagnosis not present

## 2024-08-03 DIAGNOSIS — Z23 Encounter for immunization: Secondary | ICD-10-CM | POA: Diagnosis not present

## 2024-08-03 DIAGNOSIS — E6609 Other obesity due to excess calories: Secondary | ICD-10-CM

## 2024-08-03 DIAGNOSIS — N528 Other male erectile dysfunction: Secondary | ICD-10-CM

## 2024-08-03 DIAGNOSIS — N138 Other obstructive and reflux uropathy: Secondary | ICD-10-CM

## 2024-08-03 DIAGNOSIS — N401 Enlarged prostate with lower urinary tract symptoms: Secondary | ICD-10-CM

## 2024-08-03 DIAGNOSIS — R7309 Other abnormal glucose: Secondary | ICD-10-CM

## 2024-08-03 LAB — BAYER DCA HB A1C WAIVED: HB A1C (BAYER DCA - WAIVED): 5.8 % — ABNORMAL HIGH (ref 4.8–5.6)

## 2024-08-03 LAB — MICROALBUMIN, URINE WAIVED
Creatinine, Urine Waived: 200 mg/dL (ref 10–300)
Microalb, Ur Waived: 30 mg/L — ABNORMAL HIGH (ref 0–19)
Microalb/Creat Ratio: <30 mg/g

## 2024-08-03 MED ORDER — LOSARTAN POTASSIUM 100 MG PO TABS: 100.0000 mg | ORAL_TABLET | Freq: Every day | ORAL | 4 refills | Status: AC

## 2024-08-03 MED ORDER — TADALAFIL 20 MG PO TABS: 20.0000 mg | ORAL_TABLET | Freq: Every day | ORAL | 3 refills | Status: DC | PRN

## 2024-08-03 MED ORDER — ROSUVASTATIN CALCIUM 40 MG PO TABS: 40.0000 mg | ORAL_TABLET | Freq: Every day | ORAL | 4 refills | Status: AC

## 2024-08-03 MED ORDER — EZETIMIBE 10 MG PO TABS: 10.0000 mg | ORAL_TABLET | Freq: Every day | ORAL | 4 refills | Status: AC

## 2024-08-03 MED ORDER — RANOLAZINE ER 1000 MG PO TB12: 1000.0000 mg | ORAL_TABLET | Freq: Two times a day (BID) | ORAL | 4 refills | Status: AC

## 2024-08-03 MED ORDER — AMLODIPINE BESYLATE 10 MG PO TABS: 10.0000 mg | ORAL_TABLET | Freq: Every day | ORAL | 4 refills | Status: AC

## 2024-08-03 MED ORDER — HYDRALAZINE HCL 50 MG PO TABS: 50.0000 mg | ORAL_TABLET | Freq: Two times a day (BID) | ORAL | 4 refills | Status: AC

## 2024-08-03 MED ORDER — PANTOPRAZOLE SODIUM 40 MG PO TBEC: 40.0000 mg | DELAYED_RELEASE_TABLET | Freq: Two times a day (BID) | ORAL | 4 refills | Status: AC

## 2024-08-03 MED ORDER — MONTELUKAST SODIUM 10 MG PO TABS: 10.0000 mg | ORAL_TABLET | Freq: Every day | ORAL | 4 refills | Status: AC

## 2024-08-03 MED ORDER — APIXABAN 5 MG PO TABS: 5.0000 mg | ORAL_TABLET | Freq: Two times a day (BID) | ORAL | 4 refills | Status: AC

## 2024-08-03 MED ORDER — OMEGA-3-ACID ETHYL ESTERS 1 G PO CAPS: 2.0000 g | ORAL_CAPSULE | Freq: Two times a day (BID) | ORAL | 3 refills | Status: AC

## 2024-08-03 MED ORDER — MONTELUKAST SODIUM 10 MG PO TABS: 10.0000 mg | ORAL_TABLET | Freq: Every day | ORAL | 3 refills | Status: AC

## 2024-08-03 MED ORDER — HYDROCHLOROTHIAZIDE 25 MG PO TABS: 25.0000 mg | ORAL_TABLET | Freq: Every day | ORAL | 4 refills | Status: AC

## 2024-08-03 NOTE — Assessment & Plan Note (Signed)
 Chronic, ongoing.  Well-controlled with PPI, has been on for years.  Tried reductions without success.  Risks of PPI use were discussed with patient including bone loss, C. Diff diarrhea, pneumonia, infections, CKD, electrolyte abnormalities. Verbalizes understanding and chooses to continue the medication.  Mag level annually.

## 2024-08-03 NOTE — Assessment & Plan Note (Signed)
BMI 32.04.  Recommended eating smaller high protein, low fat meals more frequently and exercising 30 mins a day 5 times a week with a goal of 10-15lb weight loss in the next 3 months. Patient voiced their understanding and motivation to adhere to these recommendations.

## 2024-08-03 NOTE — Assessment & Plan Note (Signed)
 Chronic, ongoing.  Continue current medication regimen and adjust as needed. Lipid panel today.

## 2024-08-03 NOTE — Assessment & Plan Note (Signed)
 Refills on medication sent.

## 2024-08-03 NOTE — Assessment & Plan Note (Signed)
 Ongoing, A1c 6.1% last visit, recheck today.  Recommend continue focus on diet and exercise. Urine ALB 18 September 2023.

## 2024-08-03 NOTE — Assessment & Plan Note (Signed)
 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  Will continue Eliquis  5 MG BID for prevention and may warrant a visit to hematology in future for work-up to ensure no underlying cause for frequent DVTs.

## 2024-08-03 NOTE — Assessment & Plan Note (Signed)
PSA on labs today. ?

## 2024-08-03 NOTE — Progress Notes (Signed)
 BP 121/76   Pulse 66   Temp 98 F (36.7 C)   Ht 5' 7 (1.702 m)   Wt 204 lb 9.6 oz (92.8 kg)   SpO2 96%   BMI 32.04 kg/m    Subjective:    Patient ID: Jaime [name], male    DOB: 08-08-1961, 63 y.o.   MRN: [id number]  HPI: Jaime [name] is a 63 y.o. male presenting on 08/03/2024 for comprehensive medical examination. Current medical complaints include:none  He currently lives with: wife Interim Problems from his last visit: no  HYPERTENSION / HYPERLIPIDEMIA Takes Amlodipine , Hydralazine , Losartan , HCTZ, Vascepa , Crestor , Zetia , Ranexa .  Saw cardiology on 10/09/23. Takes Eliquis  for history of DVT.  There is dx of thoracic aortic mild dilation noted on imaging 11/17/2013, however recent imaging has not noted this present.  Some anxiety about retiring in future. Satisfied with current treatment? yes Duration of hypertension: chronic BP monitoring frequency: rarely BP range:  BP medication side effects: no Duration of hyperlipidemia: chronic Cholesterol medication side effects: no Cholesterol supplements: none Medication compliance: good compliance Aspirin : yes Recent stressors: no Recurrent headaches: no Visual changes: no Palpitations: no Dyspnea: no Chest pain: no Lower extremity edema: no Dizzy/lightheaded: no   Impaired Fasting Glucose HbA1C:  Lab Results  Component Value Date   HGBA1C 6.1 (H) 10/05/2023  Duration of elevated blood sugar: chronic Polydipsia: no Polyuria: no Weight change: no Visual disturbance: no Glucose Monitoring: no    Accucheck frequency: Not Checking    Fasting glucose:     Post prandial:  Diabetic Education: Not Completed Family history of diabetes: no   GERD Takes Protonix  for heart burn, has tried reduction without benefit. GERD control status: stable Satisfied with current treatment? yes Heartburn frequency: none Medication side effects: no  Medication compliance: stable Dysphagia: no Odynophagia:  no Hematemesis:  no Blood in stool: no EGD: yes     Functional Status Survey: Is the patient deaf or have difficulty hearing?: No Does the patient have difficulty seeing, even when wearing glasses/contacts?: No Does the patient have difficulty concentrating, remembering, or making decisions?: No Does the patient have difficulty walking or climbing stairs?: No Does the patient have difficulty dressing or bathing?: No Does the patient have difficulty doing errands alone such as visiting a doctor's office or shopping?: No  FALL RISK:    08/03/2024    2:40 PM 10/05/2023    8:19 AM 05/18/2023    1:42 PM 01/05/2023    8:16 AM 11/19/2022   11:36 AM  Fall Risk   Falls in the past year? 0 0 0 0 0  Number falls in past yr: 0 0 0 0 0  Injury with Fall? 0 0 0 0 0  Risk for fall due to : No Fall Risks No Fall Risks No Fall Risks No Fall Risks No Fall Risks  Follow up Falls evaluation completed Falls evaluation completed Falls evaluation completed Falls evaluation completed Falls evaluation completed    Depression Screen    08/03/2024    2:39 PM 10/05/2023    8:19 AM 07/03/2023    3:25 PM 05/18/2023    1:43 PM 01/05/2023    8:16 AM  Depression screen PHQ 2/9  Decreased Interest 1 0 0 0 0  Down, Depressed, Hopeless 1 0 0 0 0  PHQ - 2 Score 2 0 0 0 0  Altered sleeping 0 0 1 1 0  Tired, decreased energy 2 0 1 1 1   Change in appetite 1 0 0  1 0  Feeling bad or failure about yourself  1 0 0 0 0  Trouble concentrating 1 0 0 1 0  Moving slowly or fidgety/restless 0 0 0 0 0  Suicidal thoughts 0 0 0 0 0  PHQ-9 Score 7 0  2  4  1    Difficult doing work/chores Somewhat difficult Not difficult at all  Not difficult at all Not difficult at all     Data saved with a previous flowsheet row definition      08/03/2024    2:39 PM 10/05/2023    8:19 AM 07/03/2023    3:25 PM 05/18/2023    1:43 PM  GAD 7 : Generalized Anxiety Score  Nervous, Anxious, on Edge 1 3 0 1  Control/stop worrying 1 3 0 0  Worry too much -  different things 1 3 0 0  Trouble relaxing 1 2 1 1   Restless 1 1  1   Easily annoyed or irritable 1 2 0 1  Afraid - awful might happen 1 3 0 1  Total GAD 7 Score 7 17  5   Anxiety Difficulty Somewhat difficult Not difficult at all  Not difficult at all   Past Medical History:  Past Medical History:  Diagnosis Date   Allergy    [place]   Aneurysm of thoracic aorta    Aortic atherosclerosis    Ascending aorta dilatation    a.) TTE 09/18/2021: asc Ao 39 mm   BPH (benign prostatic hyperplasia)    Bradycardia    Carpal tunnel syndrome of left wrist 09/2023   Clotting disorder    Coronary artery disease 2012   a.) MI (unknown type) 2012 --> PCI placing stent (unknown type) to mLAD; b.) LHC 10/01/2013: 40-50% pRCA - med mgmt; c.) LHC 10/22/2015: 30% pLAD, 10% ISR mLAD, 30-40% pRCA - med mgmt   ED (erectile dysfunction)    a.) on PDE5i (tadalafil )   Essential hypertension    GERD (gastroesophageal reflux disease)    Hepatic steatosis    History of 2019 novel coronavirus disease (COVID-19) 04/27/2020   History of echocardiogram    a.) TTE 09/18/2021: EF 55-60%, no RWMAs, normal RVSF, triv MR, mild AR, asc Ao 39 mm   Hyperlipidemia    Incarcerated umbilical hernia    Kidney stones    Left carpal tunnel syndrome    a.) s/p release   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)    a.) last was in 2021; provoked in setting of surgery/hospitalization and concurrent SARS-CoV-2; b.) evaluated by hematology 06/2021; hypercoagulable work up NEGATIVE (antiphospholipid syndrome panel (-), normal protein S antigen, normal protein C activity, normal ATIII, Factor V Leiden mutation (-), prothrombin gene mutation (-)   Myocardial infarction (HCC) 2012   a.) details unclear; Tx'd in [place], [place] --> PCI of mLAD (unknown type stent)   On apixaban  therapy    a.) hematology advised no need for lifelong anticoagulation (06/2021); patient notes that he was told  in [facility] that lifelong OAC would be  required; (+) DVT formation off of OAC in the past per his report; patient and PCP have elected to continue therapy   Pure hypercholesterolemia    Right inguinal hernia    Seborrheic dermatitis     Surgical History:  Past Surgical History:  Procedure Laterality Date   ANTERIOR CRUCIATE LIGAMENT REPAIR Left 1999   ANTERIOR INTEROSSEOUS NERVE DECOMPRESSION Left 10/01/2023   Procedure: Carpal Tunnel Release;  Surgeon: Cleotilde Barrio, MD;  Location: [facility] OR[facility];  Service: Orthopedics;  Laterality: Left;   CARPAL TUNNEL RELEASE Right 2018   CATARACT EXTRACTION W/ INTRAOCULAR LENS IMPLANT Left 08/24/2023   CATARACT EXTRACTION W/ INTRAOCULAR LENS IMPLANT Right 08/10/2023   COLONOSCOPY     2014, 2018, 2021   CORONARY ANGIOPLASTY WITH STENT PLACEMENT  2012   EXTRACORPOREAL SHOCK WAVE LITHOTRIPSY Left 01/30/2022   Procedure: EXTRACORPOREAL SHOCK WAVE LITHOTRIPSY (ESWL);  Surgeon: Twylla Glendia BROCKS, MD;  Location: [facility];  Service: Urology;  Laterality: Left;   INSERTION OF MESH Right 07/23/2023   Procedure: INSERTION OF MESH;  Surgeon: Desiderio Schanz, MD;  Location: [facility] OR[facility];  Service: General;  Laterality: Right;  umbilical hernia mesh also   JOINT REPLACEMENT  11/23/2015-2019   TOTAL KNEE ARTHROPLASTY Right 2019   TOTAL KNEE ARTHROPLASTY Left 2017   UMBILICAL HERNIA REPAIR N/A 07/23/2023   Procedure: HERNIA REPAIR UMBILICAL ADULT, open;  Surgeon: Desiderio Schanz, MD;  Location: [facility];  Service: General;  Laterality: N/A;    Medications:  Current Outpatient Medications on File Prior to Visit  Medication Sig   acetaminophen  (TYLENOL ) 325 MG tablet Take 2 tablets (650 mg total) by mouth every 6 (six) hours as needed for mild pain (pain score 1-3).   clobetasol (TEMOVATE) 0.05 % external solution Apply topically 2 (two) times daily.   Desoximetasone  0.25 % LIQD APPLY TO SCALP DAILY FOR 2 WEEKS AND THEN ONLY USE AS NEEDED FOR ITCHY SCALP (NO MORE THAN TWICE WEEKLY)   ketoconazole  (NIZORAL ) 2 %  shampoo Apply 1 Application topically 2 (two) times a week.   mometasone (ELOCON) 0.1 % cream Apply 1 Application topically daily.   nitroGLYCERIN  (NITROSTAT ) 0.4 MG SL tablet Place 1 tablet (0.4 mg total) under the tongue every 5 (five) minutes as needed for chest pain. For a maximum of 3 doses in 24 hours.   No current facility-administered medications on file prior to visit.    Allergies:  Allergies  Allergen Reactions   Codeine Other (See Comments)    Black outs    Social History:  Social History   Socioeconomic History   Marital status: Married    Spouse name: Kenneth Burgess   Number of children: 3   Years of education: Not on file   Highest education level: 12th grade  Occupational History   Not on file  Tobacco Use   Smoking status: Never   Smokeless tobacco: Never  Vaping Use   Vaping status: Never Used  Substance and Sexual Activity   Alcohol use: Yes    Comment: RARE Beer- 1 to 2 a month   Drug use: Never   Sexual activity: Yes    Birth control/protection: None  Other Topics Concern   Not on file  Social History Narrative   Not on file   Social Drivers of Health   Financial Resource Strain: Low Risk  (08/02/2024)   Overall Financial Resource Strain (CARDIA)    Difficulty of Paying Living Expenses: Not very hard  Food Insecurity: No Food Insecurity (08/02/2024)   Hunger Vital Sign    Worried About Running Out of Food in the Last Year: Never true    Ran Out of Food in the Last Year: Never true  Transportation Needs: No Transportation Needs (08/02/2024)   PRAPARE - Administrator, Civil Service (Medical): No    Lack of Transportation (Non-Medical): No  Physical Activity: Insufficiently Active (08/02/2024)   Exercise Vital Sign    Days of Exercise per Week: 3 days  Minutes of Exercise per Session: 30 min  Stress: No Stress Concern Present (08/02/2024)   [employer] of Occupational Health - Occupational Stress Questionnaire     Feeling of Stress: Only a little  Social Connections: Moderately Integrated (08/02/2024)   Social Connection and Isolation Panel    Frequency of Communication with Friends and Family: Twice a week    Frequency of Social Gatherings with Friends and Family: Twice a week    Attends Religious Services: More than 4 times per year    Active Member of [employer] or Organizations: No    Attends Engineer, Structural: Not on file    Marital Status: Married  Catering Manager Violence: Not on file   Social History   Tobacco Use  Smoking Status Never  Smokeless Tobacco Never   Social History   Substance and Sexual Activity  Alcohol Use Yes   Comment: RARE Beer- 1 to 2 a month    Family History:  Family History  Problem Relation Age of Onset   Pancreatic cancer Mother    Cancer Father    Heart attack Paternal Aunt     Past medical history, surgical history, medications, allergies, family history and social history reviewed with patient today and changes made to appropriate areas of the chart.   ROS All other ROS negative except what is listed above and in the HPI.      Objective:    BP 121/76   Pulse 66   Temp 98 F (36.7 C)   Ht 5' 7 (1.702 m)   Wt 204 lb 9.6 oz (92.8 kg)   SpO2 96%   BMI 32.04 kg/m   Wt Readings from Last 3 Encounters:  08/03/24 204 lb 9.6 oz (92.8 kg)  10/09/23 203 lb 12.8 oz (92.4 kg)  10/05/23 202 lb 12.8 oz (92 kg)    No results found.  Physical Exam Vitals and nursing note reviewed.  Constitutional:      General: He is awake. He is not in acute distress.    Appearance: He is well-developed and well-groomed. He is not ill-appearing or toxic-appearing.  HENT:     Head: Normocephalic and atraumatic.     Right Ear: Hearing, tympanic membrane, ear canal and external ear normal. No drainage.     Left Ear: Hearing, tympanic membrane, ear canal and external ear normal. No drainage.     Nose: Nose normal.     Mouth/Throat:     Pharynx: Uvula  midline.  Eyes:     General: Lids are normal.        Right eye: No discharge.        Left eye: No discharge.     Extraocular Movements: Extraocular movements intact.     Conjunctiva/sclera: Conjunctivae normal.     Pupils: Pupils are equal, round, and reactive to light.     Visual Fields: Right eye visual fields normal and left eye visual fields normal.  Neck:     Thyroid : No thyromegaly.     Vascular: No carotid bruit or JVD.     Trachea: Trachea normal.  Cardiovascular:     Rate and Rhythm: Normal rate and regular rhythm.     Heart sounds: Normal heart sounds, S1 normal and S2 normal. No murmur heard.    No gallop.  Pulmonary:     Effort: Pulmonary effort is normal. No accessory muscle usage or respiratory distress.     Breath sounds: Normal breath sounds.  Abdominal:     General:  Bowel sounds are normal.     Palpations: Abdomen is soft. There is no hepatomegaly or splenomegaly.     Tenderness: There is no abdominal tenderness.  Musculoskeletal:        General: Normal range of motion.     Cervical back: Normal range of motion and neck supple.     Right lower leg: No edema.     Left lower leg: No edema.  Lymphadenopathy:     Head:     Right side of head: No submental, submandibular, tonsillar, preauricular or posterior auricular adenopathy.     Left side of head: No submental, submandibular, tonsillar, preauricular or posterior auricular adenopathy.     Cervical: No cervical adenopathy.  Skin:    General: Skin is warm and dry.     Capillary Refill: Capillary refill takes less than 2 seconds.     Findings: No rash.  Neurological:     Mental Status: He is alert and oriented to person, place, and time.     Gait: Gait is intact.     Deep Tendon Reflexes: Reflexes are normal and symmetric.     Reflex Scores:      Brachioradialis reflexes are 2+ on the right side and 2+ on the left side.      Patellar reflexes are 2+ on the right side and 2+ on the left side. Psychiatric:         Attention and Perception: Attention normal.        Mood and Affect: Mood normal.        Speech: Speech normal.        Behavior: Behavior normal. Behavior is cooperative.        Thought Content: Thought content normal.        Cognition and Memory: Cognition normal.     Results for orders placed or performed in visit on 11/20/23  TSH   Collection Time: 11/20/23  8:34 AM  Result Value Ref Range   TSH 0.622 0.450 - 4.500 uIU/mL  T4, free   Collection Time: 11/20/23  8:34 AM  Result Value Ref Range   Free T4 1.32 0.82 - 1.77 ng/dL      Assessment & Plan:   Problem List Items Addressed This Visit       Cardiovascular and Mediastinum   Primary hypertension - Primary   Chronic, stable.  BP remains at goal.  Recommend he monitor BP at least a few mornings a week at home and document.  DASH diet at home.  Continue current medication regimen and adjust as needed.  Labs today: CBC, CMP, TSH, Lipid.  Continue collaboration with cardiology, recent notes reviewed.       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  Continue collaboration with cardiology and reviewed recent notes and labs.  Refills sent in.  Relevant Medications   omega-3 acid ethyl esters (LOVAZA ) 1 g capsule   tadalafil  (CIALIS ) 20 MG tablet   ezetimibe  (ZETIA ) 10 MG tablet   apixaban   (ELIQUIS ) 5 MG TABS tablet   amLODipine  (NORVASC ) 10 MG tablet   hydrALAZINE  (APRESOLINE ) 50 MG tablet   losartan  (COZAAR ) 100 MG tablet   hydrochlorothiazide  (HYDRODIURIL ) 25 MG tablet   ranolazine  (RANEXA ) 1000 MG SR tablet   rosuvastatin  (CRESTOR ) 40 MG tablet   Aneurysm of thoracic aorta   Noted on imaging March 2015.  However, since then imaging has not shown this, last seen and stable on 2019 imaging -- from there no report of this on imaging.  Continue to monitor and may benefit repeat imaging in one year.      Relevant Medications   omega-3 acid ethyl esters (LOVAZA ) 1 g capsule   tadalafil  (CIALIS ) 20 MG tablet   ezetimibe  (ZETIA ) 10 MG tablet   apixaban  (ELIQUIS ) 5 MG TABS tablet   amLODipine  (NORVASC ) 10 MG tablet   hydrALAZINE  (APRESOLINE ) 50 MG tablet   losartan  (COZAAR ) 100 MG tablet   hydrochlorothiazide  (HYDRODIURIL ) 25 MG tablet   ranolazine  (RANEXA ) 1000 MG SR tablet   rosuvastatin  (CRESTOR ) 40 MG tablet     Digestive   GERD without esophagitis   Chronic, ongoing.  Well-controlled with PPI, has been on for years.  Tried reductions without success.  Risks of PPI use were discussed with patient including bone loss, C. Diff diarrhea, pneumonia, infections, CKD, electrolyte abnormalities. Verbalizes understanding and chooses to continue the medication.  Mag level annually.       Relevant Medications   pantoprazole  (PROTONIX ) 40 MG tablet     Genitourinary   BPH with obstruction/lower urinary tract symptoms   PSA on labs today.      Relevant Orders   PSA     Other   Obesity   BMI 32.04.  Recommended eating smaller high protein, low fat meals more frequently and exercising 30 mins a day 5 times a week with a goal of 10-15lb weight loss in the next 3 months. Patient voiced their understanding and motivation to adhere to these recommendations.       Hyperlipidemia   Chronic, ongoing.  Continue current medication regimen and adjust as needed.  Lipid panel  today.      Relevant Medications   omega-3 acid ethyl esters (LOVAZA ) 1 g capsule   tadalafil  (CIALIS ) 20 MG tablet   ezetimibe  (ZETIA ) 10 MG tablet   apixaban  (ELIQUIS ) 5 MG TABS tablet   amLODipine  (NORVASC ) 10 MG tablet   hydrALAZINE  (APRESOLINE ) 50 MG tablet   losartan  (COZAAR ) 100 MG tablet   hydrochlorothiazide  (HYDRODIURIL ) 25 MG tablet   ranolazine  (RANEXA ) 1000 MG SR tablet   rosuvastatin  (CRESTOR ) 40 MG tablet   Other Relevant Orders   Comprehensive metabolic panel with GFR   Lipid Panel w/o Chol/HDL Ratio   History of DVT (deep vein thrombosis)   History of multiple DVTs in past.  Will continue Eliquis  5 MG BID for prevention and may warrant a visit to hematology in future for work-up to ensure no underlying cause for frequent DVTs.        Elevated hemoglobin A1c measurement   Ongoing, A1c 6.1% last visit, recheck today.  Recommend continue focus on diet and exercise. Urine ALB 18 September 2023.      Relevant Orders   Bayer DCA Hb A1c Waived   Microalbumin, Urine Waived   ED (erectile dysfunction)   Refills  on medication sent.      Relevant Medications   tadalafil  (CIALIS ) 20 MG tablet   Other Visit Diagnoses       Pneumococcal vaccination given       PCV20 today, educated patient.   Relevant Orders   Pneumococcal conjugate vaccine 20-valent     Encounter for annual physical exam       Annual physical today with labs and health maintenance reviewed, discussed with patient.       Discussed aspirin  prophylaxis for myocardial infarction prevention and decision was it was not indicated  LABORATORY TESTING:  Health maintenance labs ordered today as discussed above.   The natural history of prostate cancer and ongoing controversy regarding screening and potential treatment outcomes of prostate cancer has been discussed with the patient. The meaning of a false positive PSA and a false negative PSA has been discussed. He indicates understanding of the limitations  of this screening test and wishes to proceed with screening PSA testing.  IMMUNIZATIONS:   - Tdap: Tetanus vaccination status reviewed: last tetanus booster within 10 years. - Influenza: Refused - Pneumovax: Not applicable - Prevnar: Administered today - Zostavax vaccine: Up to date  SCREENING: - Colonoscopy: Ordered today  Discussed with patient purpose of the colonoscopy is to detect colon cancer at curable precancerous or early stages   - AAA Screening: Not applicable  -Hearing Test: Not applicable  -Spirometry: Not applicable   PATIENT COUNSELING:    Sexuality: Discussed sexually transmitted diseases, partner selection, use of condoms, avoidance of unintended pregnancy  and contraceptive alternatives.   Advised to avoid cigarette smoking.  I discussed with the patient that most people either abstain from alcohol or drink within safe limits (<=14/week and <=4 drinks/occasion for males, <=7/weeks and <= 3 drinks/occasion for females) and that the risk for alcohol disorders and other health effects rises proportionally with the number of drinks per week and how often a drinker exceeds daily limits.  Discussed cessation/primary prevention of drug use and availability of treatment for abuse.   Diet: Encouraged to adjust caloric intake to maintain  or achieve ideal body weight, to reduce intake of dietary saturated fat and total fat, to limit sodium intake by avoiding high sodium foods and not adding table salt, and to maintain adequate dietary potassium and calcium  preferably from fresh fruits, vegetables, and low-fat dairy products.    stressed the importance of regular exercise  Injury prevention: Discussed safety belts, safety helmets, smoke detector, smoking near bedding or upholstery.   Dental health: Discussed importance of regular tooth brushing, flossing, and dental visits.   Follow up plan: NEXT PREVENTATIVE PHYSICAL DUE IN 1 YEAR. Return in about 6 months (around  01/31/2025) for HTN/HLD, IFG. 4

## 2024-08-03 NOTE — Assessment & Plan Note (Signed)
 Noted on imaging March 2015.  However, since then imaging has not shown this, last seen and stable on 2019 imaging -- from there no report of this on imaging.  Continue to monitor and may benefit repeat imaging in one year.

## 2024-08-03 NOTE — Assessment & Plan Note (Signed)
 Chronic, stable with no recent chest pain.  Continue collaboration with cardiology and reviewed recent notes and labs.  Refills sent in.

## 2024-08-03 NOTE — Assessment & Plan Note (Signed)
 Chronic, stable.  BP remains at goal.  Recommend he monitor BP at least a few mornings a week at home and document.  DASH diet at home.  Continue current medication regimen and adjust as needed.  Labs today: CBC, CMP, TSH, Lipid.  Continue collaboration with cardiology, recent notes reviewed.

## 2024-08-04 LAB — COMPREHENSIVE METABOLIC PANEL WITH GFR
ALT: 39 IU/L (ref 0–44)
AST: 23 IU/L (ref 0–40)
Albumin: 4.2 g/dL (ref 3.9–4.9)
Alkaline Phosphatase: 77 IU/L (ref 47–123)
BUN/Creatinine Ratio: 14 (ref 10–24)
BUN: 16 mg/dL (ref 8–27)
Bilirubin Total: 0.5 mg/dL (ref 0.0–1.2)
CO2: 25 mmol/L (ref 20–29)
Calcium: 9.3 mg/dL (ref 8.6–10.2)
Chloride: 99 mmol/L (ref 96–106)
Creatinine, Ser: 1.13 mg/dL (ref 0.76–1.27)
Globulin, Total: 2.5 g/dL (ref 1.5–4.5)
Glucose: 114 mg/dL — ABNORMAL HIGH (ref 70–99)
Potassium: 3.7 mmol/L (ref 3.5–5.2)
Sodium: 140 mmol/L (ref 134–144)
Total Protein: 6.7 g/dL (ref 6.0–8.5)
eGFR: 74 mL/min/1.73 (ref >59)

## 2024-08-04 LAB — CBC WITH DIFFERENTIAL/PLATELET
Basophils Absolute: 0 x10E3/uL (ref 0.0–0.2)
Basos: 1 %
EOS (ABSOLUTE): 0.2 x10E3/uL (ref 0.0–0.4)
Eos: 2 %
Hematocrit: 44.4 % (ref 37.5–51.0)
Hemoglobin: 15 g/dL (ref 13.0–17.7)
Immature Grans (Abs): 0 x10E3/uL (ref 0.0–0.1)
Immature Granulocytes: 0 %
Lymphocytes Absolute: 1.9 x10E3/uL (ref 0.7–3.1)
Lymphs: 29 %
MCH: 31.1 pg (ref 26.6–33.0)
MCHC: 33.8 g/dL (ref 31.5–35.7)
MCV: 92 fL (ref 79–97)
Monocytes Absolute: 0.9 x10E3/uL (ref 0.1–0.9)
Monocytes: 13 %
Neutrophils Absolute: 3.8 x10E3/uL (ref 1.4–7.0)
Neutrophils: 55 %
Platelets: 304 x10E3/uL (ref 150–450)
RBC: 4.82 x10E6/uL (ref 4.14–5.80)
RDW: 13.1 % (ref 11.6–15.4)
WBC: 6.8 x10E3/uL (ref 3.4–10.8)

## 2024-08-04 LAB — TSH: TSH: 0.305 u[IU]/mL — ABNORMAL LOW (ref 0.450–4.500)

## 2024-08-04 LAB — PSA: Prostate Specific Ag, Serum: 3.2 ng/mL (ref 0.0–4.0)

## 2024-08-04 LAB — LIPID PANEL W/O CHOL/HDL RATIO
Cholesterol, Total: 113 mg/dL (ref 100–199)
HDL: 33 mg/dL — ABNORMAL LOW (ref >39)
LDL Chol Calc (NIH): 57 mg/dL (ref 0–99)
Triglycerides: 128 mg/dL (ref 0–149)
VLDL Cholesterol Cal: 23 mg/dL (ref 5–40)

## 2024-08-05 ENCOUNTER — Ambulatory Visit: Payer: Self-pay | Admitting: Nurse Practitioner

## 2024-08-05 DIAGNOSIS — R7989 Other specified abnormal findings of blood chemistry: Secondary | ICD-10-CM

## 2024-08-05 NOTE — Progress Notes (Signed)
 Contacted via MyChart -- lab only visit in 4 weeks please  Good morning Jaime [name], your labs have returned: - Kidney function, creatinine and eGFR, remains normal, as is liver function, AST and ALT.  - Glucose a little elevated, but A1c remains in prediabetic range. Continue to focus on healthy diet and regular exercise. - Lipid panel shows stable levels, continue current medications. - Thyroid  lab, TSH, is a little on lower side. More hyperactive. I would like to recheck this outpatient in 4 weeks to ensure returns to normal. - Remainder of labs stable. Any questions? Keep being amazing!!  Thank you for allowing me to participate in your care.  I appreciate you. Kindest regards, Janika Jedlicka

## 2024-08-09 NOTE — Progress Notes (Signed)
 Scheduled

## 2024-08-10 ENCOUNTER — Other Ambulatory Visit (HOSPITAL_COMMUNITY): Payer: Self-pay

## 2024-08-10 ENCOUNTER — Telehealth: Payer: Self-pay

## 2024-08-10 NOTE — Telephone Encounter (Signed)
 Pharmacy Patient Advocate Encounter   Received notification from Onbase that prior authorization for Tadalafil  20MG  tablets  is required/requested.   Insurance verification completed.   The patient is insured through [facility].   Per test claim: PA required; PA submitted to above mentioned insurance via Latent Key/confirmation #/EOC [id number] Status is pending

## 2024-08-12 NOTE — Telephone Encounter (Signed)
 Pharmacy Patient Advocate Encounter  Received notification from [employer] that Prior Authorization for Tadalafil  20MG  tablets  has been DENIED.  Full denial letter will be uploaded to the media tab. See denial reason below.

## 2024-08-12 NOTE — Telephone Encounter (Signed)
 Noted

## 2024-08-12 NOTE — Addendum Note (Signed)
 Addended by: Mcdaniel Ohms T on: 08/12/2024 05:01 PM   Modules accepted: Orders

## 2024-08-31 ENCOUNTER — Other Ambulatory Visit: Payer: Self-pay | Admitting: Nurse Practitioner

## 2024-09-05 ENCOUNTER — Other Ambulatory Visit

## 2024-09-09 ENCOUNTER — Other Ambulatory Visit

## 2024-09-09 DIAGNOSIS — R7989 Other specified abnormal findings of blood chemistry: Secondary | ICD-10-CM

## 2024-09-10 LAB — TSH: TSH: 0.334 u[IU]/mL — ABNORMAL LOW (ref 0.450–4.500)

## 2024-09-10 LAB — T4, FREE: Free T4: 1.6 ng/dL (ref 0.82–1.77)

## 2024-09-10 LAB — THYROID PEROXIDASE ANTIBODY: Thyroperoxidase Ab SerPl-aCnc: 12 [IU]/mL (ref 0–34)

## 2024-09-11 ENCOUNTER — Ambulatory Visit: Payer: Self-pay | Admitting: Nurse Practitioner

## 2024-09-11 DIAGNOSIS — R7989 Other specified abnormal findings of blood chemistry: Secondary | ICD-10-CM

## 2024-09-11 NOTE — Progress Notes (Signed)
 Contacted via MyChart -- needs lab only visit in 4 weeks please  Good morning Jaime [name], your labs have returned and overall are stable with exception of TSH which remains on low side but is trending up. I would like to recheck this one more time in 4 weeks outpatient. Do you take any Biotin as a supplement? If so hold off on taking over next weeks. Any questions?

## 2024-09-12 NOTE — Progress Notes (Signed)
 Lab appt scheduled.

## 2024-10-06 ENCOUNTER — Ambulatory Visit: Payer: Self-pay

## 2024-10-06 NOTE — Telephone Encounter (Signed)
 FYI Only or Action Required?: FYI only for provider: appointment scheduled on tomorrow morning. Pt is hoping that labs can also be drawn tomorrow.  Patient was last seen in primary care on 08/03/2024 by Valerio Melanie DASEN, NP.  Called Nurse Triage reporting Sore Throat. Pt has just started with mild cough and some sinus drainage.  Symptoms began a week ago.  Interventions attempted: OTC medications: lozenges.  Symptoms are: unchanged.  Triage Disposition: See PCP When Office is Open (Within 3 Days)  Patient/caregiver understands and will follow disposition?: yes                      Reason for Triage: Patient called to reschedule a lab visit on 10-10-24 , but mentioned he has a very painful sore throat,    Reason for Disposition  [1] Sore throat is the only symptom AND [2] present > 48 hours  Answer Assessment - Initial Assessment Questions 1. ONSET: When did the throat start hurting? (Hours or days ago)       1 week 2. SEVERITY: How bad is the sore throat? (Scale 1-10; mild, moderate or severe)     5/10 3. STREP EXPOSURE: Has there been any exposure to strep within the past week? If Yes, ask: What type of contact occurred?      no 4.  VIRAL SYMPTOMS: Are there any symptoms of a cold, such as a runny nose, cough, hoarse voice or red eyes?      cough 5. FEVER: Do you have a fever? If Yes, ask: What is your temperature, how was it measured, and when did it start?     on 6. PUS ON THE TONSILS: Is there pus on the tonsils in the back of your throat?     no 7. OTHER SYMPTOMS: Do you have any other symptoms? (e.g., difficulty breathing, headache, rash)     Cough - dry, a little sinus drainage  Protocols used: Sore Throat-A-AH

## 2024-10-07 ENCOUNTER — Ambulatory Visit (INDEPENDENT_AMBULATORY_CARE_PROVIDER_SITE_OTHER): Payer: Self-pay | Admitting: Family Medicine

## 2024-10-07 ENCOUNTER — Encounter: Payer: Self-pay | Admitting: Family Medicine

## 2024-10-07 VITALS — BP 113/70 | HR 65 | Temp 97.9°F | Resp 17 | Ht 67.01 in | Wt 204.0 lb

## 2024-10-07 DIAGNOSIS — R7989 Other specified abnormal findings of blood chemistry: Secondary | ICD-10-CM

## 2024-10-07 DIAGNOSIS — J189 Pneumonia, unspecified organism: Secondary | ICD-10-CM

## 2024-10-07 MED ORDER — AZITHROMYCIN 250 MG PO TABS: ORAL_TABLET | ORAL | 0 refills | Status: AC

## 2024-10-07 NOTE — Progress Notes (Signed)
 "  BP 113/70 (BP Location: Left Arm, Patient Position: Sitting, Cuff Size: Normal)   Pulse 65   Temp 97.9 F (36.6 C) (Oral)   Resp 17   Ht 5' 7.01 (1.702 m)   Wt 204 lb (92.5 kg)   SpO2 98%   BMI 31.94 kg/m    Subjective:    Patient ID: Jaime [name], male    DOB: May 27, 1961, 65 y.o.   MRN: [id number]  HPI: Jaime [name] is a 64 y.o. male  Chief Complaint  Patient presents with   Sore Throat    Sore throat for about a week, started getting worse yesterday, and a cough. Thinks that he may need blood work    UPPER RESPIRATORY TRACT INFECTION Duration: about a week, worse yesterday Worst symptom: sore throat and cough Fever: no Cough: yes Shortness of breath: no Wheezing: yes Chest pain: no Chest tightness: no Chest congestion: no Nasal congestion: no Runny nose: no Post nasal drip: no Sneezing: no Sore throat: yes Swollen glands: no Sinus pressure: no Headache: no Face pain: no Toothache: no Ear pain: no  Ear pressure: no  Eyes red/itching:no Eye drainage/crusting: no  Vomiting: no Rash: no Fatigue: yes Sick contacts: yes Strep contacts: no  Context: worse Recurrent sinusitis: no Relief with OTC cold/cough medications: no  Treatments attempted: nyquil    Relevant past medical, surgical, family and social history reviewed and updated as indicated. Interim medical history since our last visit reviewed. Allergies and medications reviewed and updated.  Review of Systems  Constitutional:  Positive for fatigue. Negative for activity change, appetite change, chills, diaphoresis, fever and unexpected weight change.  HENT:  Positive for sore throat. Negative for congestion, dental problem, drooling, ear discharge, ear pain, facial swelling, hearing loss, mouth sores, nosebleeds, postnasal drip, rhinorrhea, sinus pressure, sinus pain, sneezing, tinnitus, trouble swallowing and voice change.   Eyes: Negative.   Respiratory:  Positive for cough and chest  tightness. Negative for apnea, choking, shortness of breath, wheezing and stridor.   Cardiovascular: Negative.   Gastrointestinal: Negative.   Psychiatric/Behavioral: Negative.      Per HPI unless specifically indicated above     Objective:    BP 113/70 (BP Location: Left Arm, Patient Position: Sitting, Cuff Size: Normal)   Pulse 65   Temp 97.9 F (36.6 C) (Oral)   Resp 17   Ht 5' 7.01 (1.702 m)   Wt 204 lb (92.5 kg)   SpO2 98%   BMI 31.94 kg/m   Wt Readings from Last 3 Encounters:  10/07/24 204 lb (92.5 kg)  08/03/24 204 lb 9.6 oz (92.8 kg)  10/09/23 203 lb 12.8 oz (92.4 kg)    Physical Exam Vitals and nursing note reviewed.  Constitutional:      General: He is not in acute distress.    Appearance: Normal appearance. He is not ill-appearing, toxic-appearing or diaphoretic.  HENT:     Head: Normocephalic and atraumatic.     Right Ear: External ear normal.     Left Ear: External ear normal.     Nose: Nose normal.     Mouth/Throat:     Mouth: Mucous membranes are moist.     Pharynx: Oropharynx is clear.  Eyes:     General: No scleral icterus.       Right eye: No discharge.        Left eye: No discharge.     Extraocular Movements: Extraocular movements intact.     Conjunctiva/sclera: Conjunctivae normal.  Pupils: Pupils are equal, round, and reactive to light.  Cardiovascular:     Rate and Rhythm: Normal rate and regular rhythm.     Pulses: Normal pulses.     Heart sounds: Normal heart sounds. No murmur heard.    No friction rub. No gallop.  Pulmonary:     Effort: Pulmonary effort is normal. No respiratory distress.     Breath sounds: No stridor. Rhonchi (fine rhonchi throughout) present. No wheezing or rales.  Chest:     Chest wall: No tenderness.  Musculoskeletal:        General: Normal range of motion.     Cervical back: Normal range of motion and neck supple.  Skin:    General: Skin is warm and dry.     Capillary Refill: Capillary refill takes less  than 2 seconds.     Coloration: Skin is not jaundiced or pale.     Findings: No bruising, erythema, lesion or rash.  Neurological:     General: No focal deficit present.     Mental Status: He is alert and oriented to person, place, and time. Mental status is at baseline.  Psychiatric:        Mood and Affect: Mood normal.        Behavior: Behavior normal.        Thought Content: Thought content normal.        Judgment: Judgment normal.     Results for orders placed or performed in visit on 09/09/24  TSH   Collection Time: 09/09/24  8:32 AM  Result Value Ref Range   TSH 0.334 (L) 0.450 - 4.500 uIU/mL  Thyroid  peroxidase antibody   Collection Time: 09/09/24  8:32 AM  Result Value Ref Range   Thyroperoxidase Ab SerPl-aCnc 12 0 - 34 IU/mL  T4, free   Collection Time: 09/09/24  8:32 AM  Result Value Ref Range   Free T4 1.60 0.82 - 1.77 ng/dL      Assessment & Plan:   Problem List Items Addressed This Visit   None Visit Diagnoses       Atypical pneumonia    -  Primary   Will treat with azithromycin . Call with any concerns.   Relevant Medications   ketoconazole  (NIZORAL ) 2 % shampoo   azithromycin  (ZITHROMAX ) 250 MG tablet     Low TSH level       Will draw labs today as he is here. Await results.        Follow up plan: Return if symptoms worsen or fail to improve.      "

## 2024-10-08 ENCOUNTER — Ambulatory Visit: Payer: Self-pay | Admitting: Nurse Practitioner

## 2024-10-08 DIAGNOSIS — E0590 Thyrotoxicosis, unspecified without thyrotoxic crisis or storm: Secondary | ICD-10-CM | POA: Insufficient documentation

## 2024-10-08 LAB — TSH: TSH: 0.433 u[IU]/mL — ABNORMAL LOW (ref 0.450–4.500)

## 2024-10-08 LAB — T4, FREE: Free T4: 1.34 ng/dL (ref 0.82–1.77)

## 2024-10-08 NOTE — Progress Notes (Signed)
 Contacted via MyChart -- need lab only visit in 6 weeks please  Good snowy morning Jaime [name], your thyroid  levels have returned and TSH remains on low side but trending up from previous check. Free T4 is normal. I suspect you have some subclinical hyperthyroidism, overactive thyroid . I would like to perform a thyroid  ultrasound to further assess thyroid , I will order this today. Then I would like to recheck levels on thyroid  in 6 weeks. Keep being amazing!!  Thank you for allowing me to participate in your care.  I appreciate you. Kindest regards, Thailyn Khalid

## 2024-10-10 ENCOUNTER — Other Ambulatory Visit

## 2024-10-11 NOTE — Progress Notes (Signed)
 Scheduled

## 2024-10-14 ENCOUNTER — Ambulatory Visit: Payer: PRIVATE HEALTH INSURANCE | Admitting: Cardiology

## 2024-10-14 VITALS — BP 130/82 | HR 70 | Ht 67.0 in | Wt 200.0 lb

## 2024-10-14 DIAGNOSIS — I2510 Atherosclerotic heart disease of native coronary artery without angina pectoris: Secondary | ICD-10-CM

## 2024-10-14 DIAGNOSIS — I10 Essential (primary) hypertension: Secondary | ICD-10-CM

## 2024-10-14 DIAGNOSIS — E7800 Pure hypercholesterolemia, unspecified: Secondary | ICD-10-CM

## 2024-10-14 NOTE — Progress Notes (Signed)
 " Cardiology Office Note:    Date:  10/14/2024   ID:  Jaime [name], DOB 07/25/1961, MRN [id number]  PCP:  Valerio Melanie DASEN, NP   [facility] Providers Cardiologist:  Redell Cave, MD     Referring MD: Valerio Melanie DASEN, NP   Chief Complaint  Patient presents with   1 year follow up    Patient doing okay today. No complaints with the heart.     History of Present Illness:    Jaime [name] is a 64 y.o. male with a hx of CAD s/p PCI to LAD in 2012, hypertension, hyperlipidemia, DVT on Eliquis  who presents for follow-up.   Doing okay, denies chest pain or shortness of breath.  Compliant with medications as prescribed.  Takes Eliquis  due to DVT history.  Has no bleeding issues.  BP adequately controlled on current medications.  Prior notes Coreg  stopped due to bradycardia.  Heart rates in 40s. Echo 1/23 EF 55 to 60% Has a history of left lower extremity DVT after left knee surgery.  He was treated with Coumadin for a year.  Diagnosed with COVID-19 04/27/2020, developed right lower extremity DVT.  Was started on Eliquis .  He was told he would likely be on Eliquis  long-term.  He took Eliquis  for about 30 days and then stop.  Did not refill.  Being seen by hematology.  Past Medical History:  Diagnosis Date   Allergy    [place]   Aneurysm of thoracic aorta    Aortic atherosclerosis    Ascending aorta dilatation    a.) TTE 09/18/2021: asc Ao 39 mm   BPH (benign prostatic hyperplasia)    Bradycardia    Carpal tunnel syndrome of left wrist 09/2023   Clotting disorder    Coronary artery disease 2012   a.) MI (unknown type) 2012 --> PCI placing stent (unknown type) to mLAD; b.) LHC 10/01/2013: 40-50% pRCA - med mgmt; c.) LHC 10/22/2015: 30% pLAD, 10% ISR mLAD, 30-40% pRCA - med mgmt   ED (erectile dysfunction)    a.) on PDE5i (tadalafil )   Essential hypertension    GERD (gastroesophageal reflux disease)    Hepatic steatosis    History of 2019 novel coronavirus disease  (COVID-19) 04/27/2020   History of echocardiogram    a.) TTE 09/18/2021: EF 55-60%, no RWMAs, normal RVSF, triv MR, mild AR, asc Ao 39 mm   Hyperlipidemia    Incarcerated umbilical hernia    Kidney stones    Left carpal tunnel syndrome    a.) s/p release   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)    a.) last was in 2021; provoked in setting of surgery/hospitalization and concurrent SARS-CoV-2; b.) evaluated by hematology 06/2021; hypercoagulable work up NEGATIVE (antiphospholipid syndrome panel (-), normal protein S antigen, normal protein C activity, normal ATIII, Factor V Leiden mutation (-), prothrombin gene mutation (-)   Myocardial infarction (HCC) 2012   a.) details unclear; Tx'd in [place], [place] --> PCI of mLAD (unknown type stent)   On apixaban  therapy    a.) hematology advised no need for lifelong anticoagulation (06/2021); patient notes that he was told  in [facility] that lifelong OAC would be required; (+) DVT formation off of OAC in the past per his report; patient and PCP have elected to continue therapy   Pure hypercholesterolemia    Right inguinal hernia    Seborrheic dermatitis     Past Surgical History:  Procedure Laterality Date   ANTERIOR CRUCIATE LIGAMENT REPAIR Left 1999  ANTERIOR INTEROSSEOUS NERVE DECOMPRESSION Left 10/01/2023   Procedure: Carpal Tunnel Release;  Surgeon: Cleotilde Barrio, MD;  Location: [facility] ORS;  Service: Orthopedics;  Laterality: Left;   CARPAL TUNNEL RELEASE Right 2018   CATARACT EXTRACTION W/ INTRAOCULAR LENS IMPLANT Left 08/24/2023   CATARACT EXTRACTION W/ INTRAOCULAR LENS IMPLANT Right 08/10/2023   COLONOSCOPY     2014, 2018, 2021   CORONARY ANGIOPLASTY WITH STENT PLACEMENT  2012   EXTRACORPOREAL SHOCK WAVE LITHOTRIPSY Left 01/30/2022   Procedure: EXTRACORPOREAL SHOCK WAVE LITHOTRIPSY (ESWL);  Surgeon: Twylla Glendia BROCKS, MD;  Location: [facility] ORS;  Service: Urology;  Laterality: Left;   INSERTION OF MESH Right 07/23/2023   Procedure:  INSERTION OF MESH;  Surgeon: Desiderio Schanz, MD;  Location: [facility] ORS;  Service: General;  Laterality: Right;  umbilical hernia mesh also   JOINT REPLACEMENT  11/23/2015-2019   TOTAL KNEE ARTHROPLASTY Right 2019   TOTAL KNEE ARTHROPLASTY Left 2017   UMBILICAL HERNIA REPAIR N/A 07/23/2023   Procedure: HERNIA REPAIR UMBILICAL ADULT, open;  Surgeon: Desiderio Schanz, MD;  Location: [facility] ORS;  Service: General;  Laterality: N/A;    Current Medications: Current Meds  Medication Sig   acetaminophen  (TYLENOL ) 325 MG tablet Take 2 tablets (650 mg total) by mouth every 6 (six) hours as needed for mild pain (pain score 1-3).   amLODipine  (NORVASC ) 10 MG tablet Take 1 tablet (10 mg total) by mouth daily.   apixaban  (ELIQUIS ) 5 MG TABS tablet Take 1 tablet (5 mg total) by mouth 2 (two) times daily.   clobetasol (TEMOVATE) 0.05 % external solution Apply topically 2 (two) times daily.   Desoximetasone  0.25 % LIQD APPLY TO SCALP DAILY FOR 2 WEEKS AND THEN ONLY AS NEEDED FOR ITCHY SCALP (NO MORE THAN TWICE WEEKLY)   ezetimibe  (ZETIA ) 10 MG tablet Take 1 tablet (10 mg total) by mouth daily.   hydrALAZINE  (APRESOLINE ) 50 MG tablet Take 1 tablet (50 mg total) by mouth in the morning and at bedtime.   hydrochlorothiazide  (HYDRODIURIL ) 25 MG tablet Take 1 tablet (25 mg total) by mouth daily.   hydrOXYzine  (ATARAX ) 25 MG tablet Take 50 mg by mouth daily.   ketoconazole  (NIZORAL ) 2 % shampoo Apply topically 3 (three) times a week.   losartan  (COZAAR ) 100 MG tablet Take 1 tablet (100 mg total) by mouth daily.   mometasone (ELOCON) 0.1 % cream Apply 1 Application topically daily.   montelukast  (SINGULAIR ) 10 MG tablet Take 1 tablet (10 mg total) by mouth at bedtime.   montelukast  (SINGULAIR ) 10 MG tablet Take 1 tablet (10 mg total) by mouth daily.   nitroGLYCERIN  (NITROSTAT ) 0.4 MG SL tablet Place 1 tablet (0.4 mg total) under the tongue every 5 (five) minutes as needed for chest pain. For a maximum of 3 doses in 24  hours.   omega-3 acid ethyl esters (LOVAZA ) 1 g capsule Take 2 capsules (2 g total) by mouth 2 (two) times daily.   pantoprazole  (PROTONIX ) 40 MG tablet Take 1 tablet (40 mg total) by mouth in the morning and at bedtime. Need appt for further refills   ranolazine  (RANEXA ) 1000 MG SR tablet Take 1 tablet (1,000 mg total) by mouth 2 (two) times daily.   rosuvastatin  (CRESTOR ) 40 MG tablet Take 1 tablet (40 mg total) by mouth daily.   tadalafil  (CIALIS ) 20 MG tablet Take 20 mg by mouth daily as needed.     Allergies:   Codeine   Social History   Socioeconomic History   Marital status: Married  Spouse name: Kenneth Burgess   Number of children: 3   Years of education: Not on file   Highest education level: 12th grade  Occupational History   Not on file  Tobacco Use   Smoking status: Never   Smokeless tobacco: Never  Vaping Use   Vaping status: Never Used  Substance and Sexual Activity   Alcohol use: Yes    Comment: RARE Beer- 1 to 2 a month   Drug use: Never   Sexual activity: Yes    Birth control/protection: None  Other Topics Concern   Not on file  Social History Narrative   Not on file   Social Drivers of Health   Tobacco Use: Low Risk (10/07/2024)   Patient History    Smoking Tobacco Use: Never    Smokeless Tobacco Use: Never    Passive Exposure: Not on file  Financial Resource Strain: Low Risk (08/02/2024)   Overall Financial Resource Strain (CARDIA)    Difficulty of Paying Living Expenses: Not very hard  Food Insecurity: No Food Insecurity (08/02/2024)   Epic    Worried About Radiation Protection Practitioner of Food in the Last Year: Never true    Ran Out of Food in the Last Year: Never true  Transportation Needs: No Transportation Needs (08/02/2024)   Epic    Lack of Transportation (Medical): No    Lack of Transportation (Non-Medical): No  Physical Activity: Insufficiently Active (08/02/2024)   Exercise Vital Sign    Days of Exercise per Week: 3 days    Minutes of Exercise per  Session: 30 min  Stress: No Stress Concern Present (08/02/2024)   [employer] of Occupational Health - Occupational Stress Questionnaire    Feeling of Stress: Only a little  Social Connections: Moderately Integrated (08/02/2024)   Social Connection and Isolation Panel    Frequency of Communication with Friends and Family: Twice a week    Frequency of Social Gatherings with Friends and Family: Twice a week    Attends Religious Services: More than 4 times per year    Active Member of Clubs or Organizations: No    Attends Engineer, Structural: Not on file    Marital Status: Married  Depression (PHQ2-9): Low Risk (10/07/2024)   Depression (PHQ2-9)    PHQ-2 Score: 1  Recent Concern: Depression (PHQ2-9) - Medium Risk (08/03/2024)   Depression (PHQ2-9)    PHQ-2 Score: 7  Alcohol Screen: Low Risk (08/02/2024)   Alcohol Screen    Last Alcohol Screening Score (AUDIT): 1  Housing: Unknown (08/02/2024)   Epic    Unable to Pay for Housing in the Last Year: No    Number of Times Moved in the Last Year: Not on file    Homeless in the Last Year: No  Utilities: Not on file  Health Literacy: Not on file     Family History: The patient's family history includes Cancer in his father; Heart attack in his paternal aunt; Pancreatic cancer in his mother.  ROS:   Please see the history of present illness.     All other systems reviewed and are negative.  EKGs/Labs/Other Studies Reviewed:    The following studies were reviewed today:  EKG Interpretation Date/Time:  Friday October 14 2024 08:14:29 EST Ventricular Rate:  70 PR Interval:  188 QRS Duration:  92 QT Interval:  370 QTC Calculation: 399 R Axis:   33  Text Interpretation: Sinus rhythm with occasional Premature ventricular complexes Confirmed by Darliss Rogue ([id number]) on 10/14/2024 8:18:48 AM  Recent Labs: 08/03/2024: ALT 39; BUN 16; Creatinine, Ser 1.13; Hemoglobin 15.0; Platelets 304; Potassium 3.7; Sodium  140 10/07/2024: TSH 0.433  Recent Lipid Panel    Component Value Date/Time   CHOL 113 08/03/2024 1521   TRIG 128 08/03/2024 1521   HDL 33 (L) 08/03/2024 1521   CHOLHDL 2.9 07/22/2021 0740   LDLCALC 57 08/03/2024 1521     Risk Assessment/Calculations:        Physical Exam:    VS:  BP 130/82 (BP Location: Left Arm, Patient Position: Sitting, Cuff Size: Normal)   Pulse 70   Ht 5' 7 (1.702 m)   Wt 200 lb (90.7 kg)   BMI 31.32 kg/m     Wt Readings from Last 3 Encounters:  10/14/24 200 lb (90.7 kg)  10/07/24 204 lb (92.5 kg)  08/03/24 204 lb 9.6 oz (92.8 kg)     GEN:  Well nourished, well developed in mild to moderate distress HEENT: Normal NECK: No JVD; No carotid bruits CARDIAC: Regular, bradycardic. RESPIRATORY:  Clear to auscultation without rales, wheezing or rhonchi  ABDOMEN: Soft, non-tender, non-distended MUSCULOSKELETAL:  No edema; No deformity  SKIN: Warm and dry NEUROLOGIC:  Alert and oriented x 3 PSYCHIATRIC:  Normal affect   ASSESSMENT:    1. Coronary artery disease involving native heart without angina pectoris, unspecified vessel or lesion type   2. Primary hypertension   3. Pure hypercholesterolemia    PLAN:    In order of problems listed above:  CAD s/p PCI to LAD 2012.  Denies chest pain.  Echo 09/2021 EF 55 to 60%.  On Eliquis  5 mg twice daily for DVT.  Continue Crestor  40 mg daily, Ranexa  1000 mg twice daily.  History of bradycardia with beta-blocker. Hypertension, BP controlled.   Continue hydralazine  50 mg twice daily,  Continue Norvasc  10 mg daily, losartan  100 mg daily, hydrochlorothiazide  25 mg daily.   Hyperlipidemia, cholesterol controlled.  Continue Zetia  10 mg daily, Crestor  40 mg daily, Lovaza .  Follow-up in 12 months.      Medication Adjustments/Labs and Tests Ordered: Current medicines are reviewed at length with the patient today.  Concerns regarding medicines are outlined above.  Orders Placed This Encounter  Procedures   EKG  12-Lead    No orders of the defined types were placed in this encounter.    Patient Instructions  Medication Instructions:  Your physician recommends that you continue on your current medications as directed. Please refer to the Current Medication list given to you today.   *If you need a refill on your cardiac medications before your next appointment, please call your pharmacy*  Lab Work: No labs ordered today  If you have labs (blood work) drawn today and your tests are completely normal, you will receive your results only by: MyChart Message (if you have MyChart) OR A paper copy in the mail If you have any lab test that is abnormal or we need to change your treatment, we will call you to review the results.  Testing/Procedures: No test ordered today   Follow-Up: At [facility], you and your health needs are our priority.  As part of our continuing mission to provide you with exceptional heart care, our providers are all part of one team.  This team includes your primary Cardiologist (physician) and Advanced Practice Providers or APPs (Physician Assistants and Nurse Practitioners) who all work together to provide you with the care you need, when you need it.  Your next appointment:   12  month(s)  Provider:   You may see Redell Cave, MD or one of the following Advanced Practice Providers on your designated Care Team:   Lonni Meager, NP Lesley Maffucci, PA-C Bernardino Bring, PA-C Cadence [place], PA-C Tylene Lunch, NP Barnie Hila, NP    We recommend signing up for the patient portal called MyChart.  Sign up information is provided on this After Visit Summary.  MyChart is used to connect with patients for Virtual Visits (Telemedicine).  Patients are able to view lab/test results, encounter notes, upcoming appointments, etc.  Non-urgent messages can be sent to your provider as well.   To learn more about what you can do with MyChart, go to  [email or web address].               Signed, Redell Cave, MD  10/14/2024 8:55 AM     [facility] "

## 2024-10-14 NOTE — Patient Instructions (Signed)
 Medication Instructions:   Your physician recommends that you continue on your current medications as directed. Please refer to the Current Medication list given to you today.   *If you need a refill on your cardiac medications before your next appointment, please call your pharmacy*  Lab Work: No labs ordered today   If you have labs (blood work) drawn today and your tests are completely normal, you will receive your results only by: MyChart Message (if you have MyChart) OR A paper copy in the mail If you have any lab test that is abnormal or we need to change your treatment, we will call you to review the results.  Testing/Procedures:  No test ordered today   Follow-Up: At [facility], you and your health needs are our priority.  As part of our continuing mission to provide you with exceptional heart care, our providers are all part of one team.  This team includes your primary Cardiologist (physician) and Advanced Practice Providers or APPs (Physician Assistants and Nurse Practitioners) who all work together to provide you with the care you need, when you need it.  Your next appointment:   12 month(s)  Provider:   You may see Constancia Delton, MD or one of the following Advanced Practice Providers on your designated Care Team:   Laneta Pintos, NP Gildardo Labrador, PA-C Varney Gentleman, PA-C Cadence [place], PA-C Ronald Cockayne, NP Morey Ar, NP    We recommend signing up for the patient portal called MyChart.  Sign up information is provided on this After Visit Summary.  MyChart is used to connect with patients for Virtual Visits (Telemedicine).  Patients are able to view lab/test results, encounter notes, upcoming appointments, etc.  Non-urgent messages can be sent to your provider as well.   To learn more about what you can do with MyChart, go to [email or web address].

## 2024-10-28 ENCOUNTER — Ambulatory Visit: Payer: PRIVATE HEALTH INSURANCE

## 2024-11-09 ENCOUNTER — Other Ambulatory Visit: Payer: PRIVATE HEALTH INSURANCE

## 2025-01-31 ENCOUNTER — Ambulatory Visit: Admitting: Nurse Practitioner
